# Patient Record
Sex: Female | Born: 1935 | Race: White | Hispanic: No | Marital: Single | Smoking: Never smoker
Health system: Southern US, Community
[De-identification: ages and names within clinical notes are randomized; demographics above are authoritative.]

## PROBLEM LIST (undated history)

## (undated) DIAGNOSIS — R296 Repeated falls: Secondary | ICD-10-CM

## (undated) DIAGNOSIS — R5381 Other malaise: Secondary | ICD-10-CM

## (undated) DIAGNOSIS — M6281 Muscle weakness (generalized): Secondary | ICD-10-CM

## (undated) DIAGNOSIS — M810 Age-related osteoporosis without current pathological fracture: Secondary | ICD-10-CM

## (undated) DIAGNOSIS — E039 Hypothyroidism, unspecified: Secondary | ICD-10-CM

## (undated) DIAGNOSIS — R262 Difficulty in walking, not elsewhere classified: Secondary | ICD-10-CM

## (undated) DIAGNOSIS — N390 Urinary tract infection, site not specified: Secondary | ICD-10-CM

## (undated) DIAGNOSIS — M549 Dorsalgia, unspecified: Secondary | ICD-10-CM

## (undated) DIAGNOSIS — I471 Supraventricular tachycardia, unspecified: Secondary | ICD-10-CM

## (undated) DIAGNOSIS — M1990 Unspecified osteoarthritis, unspecified site: Secondary | ICD-10-CM

## (undated) DIAGNOSIS — G9341 Metabolic encephalopathy: Secondary | ICD-10-CM

## (undated) DIAGNOSIS — R233 Spontaneous ecchymoses: Secondary | ICD-10-CM

## (undated) DIAGNOSIS — J189 Pneumonia, unspecified organism: Secondary | ICD-10-CM

## (undated) DIAGNOSIS — R011 Cardiac murmur, unspecified: Secondary | ICD-10-CM

## (undated) DIAGNOSIS — I509 Heart failure, unspecified: Secondary | ICD-10-CM

## (undated) DIAGNOSIS — R238 Other skin changes: Secondary | ICD-10-CM

## (undated) DIAGNOSIS — I10 Essential (primary) hypertension: Secondary | ICD-10-CM

## (undated) DIAGNOSIS — I739 Peripheral vascular disease, unspecified: Secondary | ICD-10-CM

## (undated) DIAGNOSIS — G8929 Other chronic pain: Secondary | ICD-10-CM

## (undated) DIAGNOSIS — I340 Nonrheumatic mitral (valve) insufficiency: Secondary | ICD-10-CM

## (undated) DIAGNOSIS — R05 Cough: Secondary | ICD-10-CM

## (undated) DIAGNOSIS — I639 Cerebral infarction, unspecified: Secondary | ICD-10-CM

## (undated) DIAGNOSIS — Z8679 Personal history of other diseases of the circulatory system: Secondary | ICD-10-CM

## (undated) DIAGNOSIS — H9190 Unspecified hearing loss, unspecified ear: Secondary | ICD-10-CM

## (undated) DIAGNOSIS — E785 Hyperlipidemia, unspecified: Secondary | ICD-10-CM

## (undated) HISTORY — DX: Pneumonia, unspecified organism: J18.9

## (undated) HISTORY — PX: TONSILLECTOMY AND ADENOIDECTOMY: SUR1326

## (undated) HISTORY — DX: Heart failure, unspecified: I50.9

## (undated) HISTORY — DX: Other malaise: R53.81

## (undated) HISTORY — DX: Hyperlipidemia, unspecified: E78.5

## (undated) HISTORY — PX: APPENDECTOMY: SHX54

## (undated) HISTORY — DX: Supraventricular tachycardia, unspecified: I47.10

## (undated) HISTORY — DX: Supraventricular tachycardia: I47.1

## (undated) HISTORY — DX: Personal history of other diseases of the circulatory system: Z86.79

## (undated) HISTORY — DX: Age-related osteoporosis without current pathological fracture: M81.0

## (undated) HISTORY — DX: Essential (primary) hypertension: I10

## (undated) HISTORY — DX: Nonrheumatic mitral (valve) insufficiency: I34.0

## (undated) HISTORY — DX: Urinary tract infection, site not specified: N39.0

## (undated) HISTORY — DX: Cerebral infarction, unspecified: I63.9

## (undated) HISTORY — DX: Unspecified osteoarthritis, unspecified site: M19.90

---

## 1958-11-24 HISTORY — PX: DILATION AND CURETTAGE OF UTERUS: SHX78

## 1982-03-25 HISTORY — PX: CATARACT EXTRACTION W/ INTRAOCULAR LENS  IMPLANT, BILATERAL: SHX1307

## 1998-01-06 ENCOUNTER — Other Ambulatory Visit: Admission: RE | Admit: 1998-01-06 | Discharge: 1998-01-06 | Payer: Self-pay | Admitting: Family Medicine

## 1998-01-12 ENCOUNTER — Ambulatory Visit: Admission: RE | Admit: 1998-01-12 | Discharge: 1998-01-12 | Payer: Self-pay | Admitting: Family Medicine

## 1999-01-11 ENCOUNTER — Other Ambulatory Visit: Admission: RE | Admit: 1999-01-11 | Discharge: 1999-01-11 | Payer: Self-pay | Admitting: Family Medicine

## 2000-01-30 ENCOUNTER — Encounter: Admission: RE | Admit: 2000-01-30 | Discharge: 2000-01-30 | Payer: Self-pay | Admitting: Family Medicine

## 2000-01-30 ENCOUNTER — Encounter: Payer: Self-pay | Admitting: Family Medicine

## 2000-09-17 ENCOUNTER — Other Ambulatory Visit: Admission: RE | Admit: 2000-09-17 | Discharge: 2000-09-17 | Payer: Self-pay | Admitting: Gastroenterology

## 2000-12-23 HISTORY — PX: OTHER SURGICAL HISTORY: SHX169

## 2001-01-19 ENCOUNTER — Encounter: Payer: Self-pay | Admitting: Surgery

## 2001-01-19 ENCOUNTER — Encounter (INDEPENDENT_AMBULATORY_CARE_PROVIDER_SITE_OTHER): Payer: Self-pay

## 2001-01-19 ENCOUNTER — Inpatient Hospital Stay (HOSPITAL_COMMUNITY): Admission: RE | Admit: 2001-01-19 | Discharge: 2001-01-26 | Payer: Self-pay | Admitting: Surgery

## 2001-06-15 ENCOUNTER — Other Ambulatory Visit: Admission: RE | Admit: 2001-06-15 | Discharge: 2001-06-15 | Payer: Self-pay | Admitting: Family Medicine

## 2001-07-06 ENCOUNTER — Encounter: Payer: Self-pay | Admitting: Family Medicine

## 2001-07-06 ENCOUNTER — Encounter: Admission: RE | Admit: 2001-07-06 | Discharge: 2001-07-06 | Payer: Self-pay | Admitting: Family Medicine

## 2002-11-18 ENCOUNTER — Encounter: Admission: RE | Admit: 2002-11-18 | Discharge: 2002-11-18 | Payer: Self-pay | Admitting: Family Medicine

## 2002-11-18 ENCOUNTER — Encounter: Payer: Self-pay | Admitting: Family Medicine

## 2004-07-31 ENCOUNTER — Other Ambulatory Visit: Admission: RE | Admit: 2004-07-31 | Discharge: 2004-07-31 | Payer: Self-pay | Admitting: Family Medicine

## 2005-01-27 ENCOUNTER — Inpatient Hospital Stay (HOSPITAL_COMMUNITY): Admission: EM | Admit: 2005-01-27 | Discharge: 2005-01-30 | Payer: Self-pay | Admitting: Emergency Medicine

## 2006-04-01 ENCOUNTER — Inpatient Hospital Stay (HOSPITAL_COMMUNITY): Admission: AD | Admit: 2006-04-01 | Discharge: 2006-04-04 | Payer: Self-pay | Admitting: Orthopedic Surgery

## 2006-04-01 ENCOUNTER — Encounter (INDEPENDENT_AMBULATORY_CARE_PROVIDER_SITE_OTHER): Payer: Self-pay | Admitting: Neurology

## 2006-04-02 ENCOUNTER — Ambulatory Visit: Payer: Self-pay | Admitting: Physical Medicine & Rehabilitation

## 2006-07-04 ENCOUNTER — Encounter: Admission: RE | Admit: 2006-07-04 | Discharge: 2006-07-04 | Payer: Self-pay | Admitting: Family Medicine

## 2008-03-25 ENCOUNTER — Encounter: Admission: RE | Admit: 2008-03-25 | Discharge: 2009-03-23 | Payer: Self-pay | Admitting: Family Medicine

## 2008-03-25 DIAGNOSIS — R296 Repeated falls: Secondary | ICD-10-CM

## 2008-03-25 HISTORY — DX: Repeated falls: R29.6

## 2008-08-13 ENCOUNTER — Inpatient Hospital Stay (HOSPITAL_COMMUNITY): Admission: EM | Admit: 2008-08-13 | Discharge: 2008-08-17 | Payer: Self-pay | Admitting: Emergency Medicine

## 2008-08-15 ENCOUNTER — Ambulatory Visit: Payer: Self-pay | Admitting: Vascular Surgery

## 2008-08-15 ENCOUNTER — Encounter (INDEPENDENT_AMBULATORY_CARE_PROVIDER_SITE_OTHER): Payer: Self-pay | Admitting: Internal Medicine

## 2008-11-09 ENCOUNTER — Ambulatory Visit: Payer: Self-pay | Admitting: Diagnostic Radiology

## 2008-11-09 ENCOUNTER — Emergency Department (HOSPITAL_BASED_OUTPATIENT_CLINIC_OR_DEPARTMENT_OTHER): Admission: EM | Admit: 2008-11-09 | Discharge: 2008-11-09 | Payer: Self-pay | Admitting: Emergency Medicine

## 2008-12-03 ENCOUNTER — Ambulatory Visit: Payer: Self-pay | Admitting: Diagnostic Radiology

## 2008-12-03 ENCOUNTER — Emergency Department (HOSPITAL_BASED_OUTPATIENT_CLINIC_OR_DEPARTMENT_OTHER): Admission: EM | Admit: 2008-12-03 | Discharge: 2008-12-03 | Payer: Self-pay | Admitting: Emergency Medicine

## 2009-02-07 ENCOUNTER — Encounter: Admission: RE | Admit: 2009-02-07 | Discharge: 2009-03-22 | Payer: Self-pay | Admitting: Family Medicine

## 2009-02-21 ENCOUNTER — Encounter: Admission: RE | Admit: 2009-02-21 | Discharge: 2009-02-21 | Payer: Self-pay | Admitting: Cardiology

## 2009-03-22 ENCOUNTER — Encounter: Admission: RE | Admit: 2009-03-22 | Discharge: 2009-03-22 | Payer: Self-pay | Admitting: Family Medicine

## 2009-03-25 ENCOUNTER — Encounter: Admission: RE | Admit: 2009-03-25 | Discharge: 2009-04-10 | Payer: Self-pay | Admitting: Family Medicine

## 2009-05-01 ENCOUNTER — Inpatient Hospital Stay (HOSPITAL_COMMUNITY): Admission: EM | Admit: 2009-05-01 | Discharge: 2009-05-08 | Payer: Self-pay | Admitting: Neurology

## 2009-05-02 ENCOUNTER — Encounter (INDEPENDENT_AMBULATORY_CARE_PROVIDER_SITE_OTHER): Payer: Self-pay | Admitting: Neurology

## 2009-07-21 ENCOUNTER — Observation Stay (HOSPITAL_COMMUNITY): Admission: EM | Admit: 2009-07-21 | Discharge: 2009-07-26 | Payer: Self-pay | Admitting: Emergency Medicine

## 2010-01-18 ENCOUNTER — Ambulatory Visit: Payer: Self-pay | Admitting: Diagnostic Radiology

## 2010-01-18 ENCOUNTER — Encounter: Payer: Self-pay | Admitting: Emergency Medicine

## 2010-01-18 ENCOUNTER — Inpatient Hospital Stay (HOSPITAL_COMMUNITY): Admission: EM | Admit: 2010-01-18 | Discharge: 2010-01-24 | Payer: Self-pay | Admitting: Internal Medicine

## 2010-03-01 ENCOUNTER — Ambulatory Visit: Payer: Self-pay | Admitting: Cardiology

## 2010-03-04 ENCOUNTER — Emergency Department (HOSPITAL_COMMUNITY)
Admission: EM | Admit: 2010-03-04 | Discharge: 2010-03-04 | Payer: Self-pay | Source: Home / Self Care | Admitting: Emergency Medicine

## 2010-03-21 ENCOUNTER — Ambulatory Visit (HOSPITAL_COMMUNITY)
Admission: RE | Admit: 2010-03-21 | Discharge: 2010-03-21 | Payer: Self-pay | Source: Home / Self Care | Attending: Internal Medicine | Admitting: Internal Medicine

## 2010-04-14 ENCOUNTER — Encounter: Payer: Self-pay | Admitting: Family Medicine

## 2010-04-15 ENCOUNTER — Encounter: Payer: Self-pay | Admitting: Family Medicine

## 2010-06-06 LAB — CBC
Hemoglobin: 14.8 g/dL (ref 12.0–15.0)
MCH: 30.5 pg (ref 26.0–34.0)
RBC: 4.83 MIL/uL (ref 3.87–5.11)
RDW: 14.3 % (ref 11.5–15.5)
WBC: 7.7 10*3/uL (ref 4.0–10.5)

## 2010-06-06 LAB — BASIC METABOLIC PANEL
BUN: 19 mg/dL (ref 6–23)
BUN: 26 mg/dL — ABNORMAL HIGH (ref 6–23)
CO2: 26 mEq/L (ref 19–32)
Calcium: 9.6 mg/dL (ref 8.4–10.5)
Chloride: 109 mEq/L (ref 96–112)
Creatinine, Ser: 1 mg/dL (ref 0.4–1.2)
Glucose, Bld: 105 mg/dL — ABNORMAL HIGH (ref 70–99)
Sodium: 140 mEq/L (ref 135–145)

## 2010-06-06 LAB — URINALYSIS, ROUTINE W REFLEX MICROSCOPIC
Bilirubin Urine: NEGATIVE
Glucose, UA: NEGATIVE mg/dL
Ketones, ur: NEGATIVE mg/dL
Leukocytes, UA: NEGATIVE
Nitrite: NEGATIVE
Urobilinogen, UA: 0.2 mg/dL (ref 0.0–1.0)

## 2010-06-06 LAB — DIFFERENTIAL
Lymphocytes Relative: 13 % (ref 12–46)
Monocytes Absolute: 0.6 10*3/uL (ref 0.1–1.0)
Monocytes Relative: 7 % (ref 3–12)

## 2010-06-06 LAB — URINE MICROSCOPIC-ADD ON

## 2010-06-12 LAB — DIFFERENTIAL
Basophils Absolute: 0 10*3/uL (ref 0.0–0.1)
Basophils Absolute: 0.1 10*3/uL (ref 0.0–0.1)
Basophils Relative: 1 % (ref 0–1)
Basophils Relative: 1 % (ref 0–1)
Eosinophils Absolute: 0 10*3/uL (ref 0.0–0.7)
Eosinophils Absolute: 0.1 10*3/uL (ref 0.0–0.7)
Eosinophils Relative: 1 % (ref 0–5)
Lymphocytes Relative: 14 % (ref 12–46)
Monocytes Absolute: 0.6 10*3/uL (ref 0.1–1.0)
Monocytes Relative: 11 % (ref 3–12)
Neutro Abs: 4.3 10*3/uL (ref 1.7–7.7)
Neutrophils Relative %: 63 % (ref 43–77)

## 2010-06-12 LAB — CBC
HCT: 40.8 % (ref 36.0–46.0)
HCT: 41.3 % (ref 36.0–46.0)
HCT: 40.1 % (ref 36.0–46.0)
HCT: 40.7 % (ref 36.0–46.0)
Hemoglobin: 13.5 g/dL (ref 12.0–15.0)
Hemoglobin: 13.4 g/dL (ref 12.0–15.0)
MCHC: 33.2 g/dL (ref 30.0–36.0)
MCHC: 32.9 g/dL (ref 30.0–36.0)
MCV: 92.5 fL (ref 78.0–100.0)
MCV: 92.1 fL (ref 78.0–100.0)
Platelets: 219 10*3/uL (ref 150–400)
Platelets: 238 10*3/uL (ref 150–400)
RBC: 4.35 MIL/uL (ref 3.87–5.11)
RDW: 15.9 % — ABNORMAL HIGH (ref 11.5–15.5)
RDW: 16 % — ABNORMAL HIGH (ref 11.5–15.5)
RDW: 15.8 % — ABNORMAL HIGH (ref 11.5–15.5)
WBC: 6.8 10*3/uL (ref 4.0–10.5)

## 2010-06-12 LAB — COMPREHENSIVE METABOLIC PANEL
Albumin: 3.7 g/dL (ref 3.5–5.2)
Alkaline Phosphatase: 49 U/L (ref 39–117)
BUN: 21 mg/dL (ref 6–23)
CO2: 31 mEq/L (ref 19–32)
Chloride: 102 mEq/L (ref 96–112)
Creatinine, Ser: 1.07 mg/dL (ref 0.4–1.2)
GFR calc non Af Amer: 47 mL/min — ABNORMAL LOW (ref >60)
Glucose, Bld: 93 mg/dL (ref 70–99)
Potassium: 4 mEq/L (ref 3.5–5.1)
Total Bilirubin: 0.7 mg/dL (ref 0.3–1.2)

## 2010-06-12 LAB — BASIC METABOLIC PANEL
BUN: 31 mg/dL — ABNORMAL HIGH (ref 6–23)
BUN: 23 mg/dL (ref 6–23)
CO2: 31 mEq/L (ref 19–32)
CO2: 27 mEq/L (ref 19–32)
Calcium: 9 mg/dL (ref 8.4–10.5)
Creatinine, Ser: 0.77 mg/dL (ref 0.4–1.2)
GFR calc non Af Amer: >60 mL/min (ref >60)
GFR calc non Af Amer: 56 mL/min — ABNORMAL LOW (ref >60)
Glucose, Bld: 91 mg/dL (ref 70–99)
Glucose, Bld: 94 mg/dL (ref 70–99)
Glucose, Bld: 70 mg/dL (ref 70–99)
Potassium: 4.1 mEq/L (ref 3.5–5.1)
Potassium: 4.2 mEq/L (ref 3.5–5.1)
Potassium: 4.3 mEq/L (ref 3.5–5.1)
Sodium: 141 mEq/L (ref 135–145)

## 2010-06-12 LAB — LIPID PANEL
LDL Cholesterol: 152 mg/dL — ABNORMAL HIGH (ref 0–99)
Total CHOL/HDL Ratio: 5.2 RATIO
Triglycerides: 174 mg/dL — ABNORMAL HIGH (ref <150)
VLDL: 35 mg/dL (ref 0–40)

## 2010-06-12 LAB — PHOSPHORUS: Phosphorus: 4.1 mg/dL (ref 2.3–4.6)

## 2010-06-12 LAB — URINE CULTURE

## 2010-06-12 LAB — POCT CARDIAC MARKERS: Troponin i, poc: <0.05 ng/mL (ref 0.00–0.09)

## 2010-06-12 LAB — PROTIME-INR: Prothrombin Time: 13.8 seconds (ref 11.6–15.2)

## 2010-06-12 LAB — URINALYSIS, ROUTINE W REFLEX MICROSCOPIC
Bilirubin Urine: NEGATIVE
Nitrite: NEGATIVE
Specific Gravity, Urine: 1.01 (ref 1.005–1.030)
Urobilinogen, UA: 0.2 mg/dL (ref 0.0–1.0)

## 2010-06-12 LAB — TSH: TSH: 7.595 u[IU]/mL — ABNORMAL HIGH (ref 0.350–4.500)

## 2010-06-12 LAB — T4, FREE: Free T4: 0.96 ng/dL (ref 0.80–1.80)

## 2010-06-13 LAB — CK TOTAL AND CKMB (NOT AT ARMC)
CK, MB: 7.4 ng/mL (ref 0.3–4.0)
Relative Index: INVALID (ref 0.0–2.5)
Total CK: 64 U/L (ref 7–177)

## 2010-06-13 LAB — URINALYSIS, ROUTINE W REFLEX MICROSCOPIC
Hgb urine dipstick: NEGATIVE
Nitrite: NEGATIVE
Protein, ur: NEGATIVE mg/dL
Urobilinogen, UA: 0.2 mg/dL (ref 0.0–1.0)

## 2010-06-13 LAB — HEPARIN LEVEL (UNFRACTIONATED): Heparin Unfractionated: 0.32 IU/mL (ref 0.30–0.70)

## 2010-06-13 LAB — CARDIAC PANEL(CRET KIN+CKTOT+MB+TROPI)
Relative Index: INVALID (ref 0.0–2.5)
Total CK: 49 U/L (ref 7–177)

## 2010-06-13 LAB — COMPREHENSIVE METABOLIC PANEL
Albumin: 3.6 g/dL (ref 3.5–5.2)
BUN: 20 mg/dL (ref 6–23)
Calcium: 9 mg/dL (ref 8.4–10.5)
Creatinine, Ser: 0.77 mg/dL (ref 0.4–1.2)
Glucose, Bld: 101 mg/dL — ABNORMAL HIGH (ref 70–99)
Total Protein: 6.8 g/dL (ref 6.0–8.3)

## 2010-06-13 LAB — APTT: aPTT: 26 seconds (ref 24–37)

## 2010-06-13 LAB — CBC
HCT: 37.7 % (ref 36.0–46.0)
HCT: 39.7 % (ref 36.0–46.0)
Hemoglobin: 12.5 g/dL (ref 12.0–15.0)
Hemoglobin: 13.1 g/dL (ref 12.0–15.0)
MCHC: 33.1 g/dL (ref 30.0–36.0)
MCHC: 33.2 g/dL (ref 30.0–36.0)
MCHC: 33.4 g/dL (ref 30.0–36.0)
MCV: 91.2 fL (ref 78.0–100.0)
MCV: 91.4 fL (ref 78.0–100.0)
Platelets: 215 10*3/uL (ref 150–400)
RBC: 4.41 MIL/uL (ref 3.87–5.11)
RBC: 4.42 MIL/uL (ref 3.87–5.11)
RDW: 15.1 % (ref 11.5–15.5)
RDW: 15.4 % (ref 11.5–15.5)
WBC: 7.7 10*3/uL (ref 4.0–10.5)
WBC: 9.5 10*3/uL (ref 4.0–10.5)

## 2010-06-13 LAB — DIFFERENTIAL
Basophils Relative: 1 % (ref 0–1)
Lymphocytes Relative: 15 % (ref 12–46)
Lymphs Abs: 1.2 10*3/uL (ref 0.7–4.0)
Monocytes Relative: 7 % (ref 3–12)
Neutro Abs: 6.2 10*3/uL (ref 1.7–7.7)
Neutrophils Relative %: 76 % (ref 43–77)

## 2010-06-13 LAB — TROPONIN I: Troponin I: 0.11 ng/mL — ABNORMAL HIGH (ref 0.00–0.06)

## 2010-06-13 LAB — BASIC METABOLIC PANEL
BUN: 19 mg/dL (ref 6–23)
Calcium: 8.4 mg/dL (ref 8.4–10.5)
Calcium: 8.6 mg/dL (ref 8.4–10.5)
Creatinine, Ser: 0.7 mg/dL (ref 0.4–1.2)
GFR calc Af Amer: >60 mL/min (ref >60)
GFR calc non Af Amer: >60 mL/min (ref >60)
Glucose, Bld: 91 mg/dL (ref 70–99)

## 2010-06-13 LAB — PROTIME-INR: INR: 1.1 (ref 0.00–1.49)

## 2010-06-13 LAB — LIPID PANEL: Triglycerides: 141 mg/dL (ref <150)

## 2010-06-13 LAB — VITAMIN B12: Vitamin B-12: 589 pg/mL (ref 211–911)

## 2010-06-13 LAB — TSH: TSH: 5.159 u[IU]/mL — ABNORMAL HIGH (ref 0.350–4.500)

## 2010-07-03 LAB — COMPREHENSIVE METABOLIC PANEL
AST: 26 U/L (ref 0–37)
BUN: 16 mg/dL (ref 6–23)
BUN: 19 mg/dL (ref 6–23)
CO2: 29 mEq/L (ref 19–32)
CO2: 32 mEq/L (ref 19–32)
Calcium: 8.7 mg/dL (ref 8.4–10.5)
Calcium: 9.4 mg/dL (ref 8.4–10.5)
Chloride: 101 mEq/L (ref 96–112)
Creatinine, Ser: 0.61 mg/dL (ref 0.4–1.2)
Creatinine, Ser: 0.74 mg/dL (ref 0.4–1.2)
GFR calc non Af Amer: >60 mL/min (ref >60)
GFR calc non Af Amer: >60 mL/min (ref >60)
Glucose, Bld: 112 mg/dL — ABNORMAL HIGH (ref 70–99)
Potassium: 3.9 mEq/L (ref 3.5–5.1)
Sodium: 140 mEq/L (ref 135–145)
Total Bilirubin: 0.8 mg/dL (ref 0.3–1.2)

## 2010-07-03 LAB — CBC
HCT: 36.7 % (ref 36.0–46.0)
Hemoglobin: 12 g/dL (ref 12.0–15.0)
MCHC: 32.3 g/dL (ref 30.0–36.0)
MCHC: 32.6 g/dL (ref 30.0–36.0)
MCV: 89.7 fL (ref 78.0–100.0)
Platelets: 178 10*3/uL (ref 150–400)
Platelets: 202 10*3/uL (ref 150–400)
RDW: 15.6 % — ABNORMAL HIGH (ref 11.5–15.5)
RDW: 16 % — ABNORMAL HIGH (ref 11.5–15.5)
WBC: 8.1 10*3/uL (ref 4.0–10.5)

## 2010-07-03 LAB — DIFFERENTIAL
Basophils Absolute: 0 10*3/uL (ref 0.0–0.1)
Lymphocytes Relative: 9 % — ABNORMAL LOW (ref 12–46)
Lymphs Abs: 0.8 10*3/uL (ref 0.7–4.0)
Neutrophils Relative %: 82 % — ABNORMAL HIGH (ref 43–77)

## 2010-07-03 LAB — CK TOTAL AND CKMB (NOT AT ARMC)
CK, MB: 3.5 ng/mL (ref 0.3–4.0)
CK, MB: 4.1 ng/mL — ABNORMAL HIGH (ref 0.3–4.0)
Relative Index: 1.9 (ref 0.0–2.5)
Relative Index: 2.1 (ref 0.0–2.5)
Total CK: 167 U/L (ref 7–177)
Total CK: 259 U/L — ABNORMAL HIGH (ref 7–177)

## 2010-07-03 LAB — BRAIN NATRIURETIC PEPTIDE: Pro B Natriuretic peptide (BNP): 763 pg/mL — ABNORMAL HIGH (ref 0.0–100.0)

## 2010-07-03 LAB — TROPONIN I: Troponin I: 0.18 ng/mL — ABNORMAL HIGH (ref 0.00–0.06)

## 2010-07-03 LAB — PROTIME-INR
INR: 1.1 (ref 0.00–1.49)
Prothrombin Time: 14.4 seconds (ref 11.6–15.2)

## 2010-07-27 ENCOUNTER — Telehealth: Payer: Self-pay | Admitting: Cardiology

## 2010-07-27 NOTE — Telephone Encounter (Signed)
Tried calling daughter back. No answer.

## 2010-07-27 NOTE — Telephone Encounter (Signed)
Patient daughter, has indicated that the patient Lasix has been increased to 80mg .  She is questioning, is this ok.  Please call.

## 2010-07-30 NOTE — Telephone Encounter (Signed)
Daughter called stating PA at [facility] has put her mother on Lasix 80 mg daily. States legs not swollen and is not SOB. States they have done blood work but doesn't know results. Advised that Dr. Chilton Si will be the one to write orders while she is at [facility]. We can't over ride any meds if we are not following lab work. Now when she is seen here and we do lab work Dr. Swaziland will decide on meds. Has an app in June. She is just concerned because mother has never been on that much Lasix. Dr. Swaziland agrees that unless we are following lab work we can't decide medication dosage.

## 2010-08-07 NOTE — H&P (Signed)
NAMERAMESHA, POSTER             ACCOUNT NO.:  [id number]   MEDICAL RECORD NO.:  [id number]          PATIENT TYPE:  INP   LOCATION:  1404                         FACILITY:  [facility]   PHYSICIAN:  Joylene John, MD       DATE OF BIRTH:  December 24, 1909   DATE OF ADMISSION:  08/12/2008  DATE OF DISCHARGE:                              HISTORY & PHYSICAL   CHIEF COMPLAINT:  Status post fall and head laceration.   HISTORY OF PRESENT ILLNESS:  This is a 77 year old female coming in  status post fall.  The patient is hard of hearing, so most of the  history is obtained from the daughter.  The patient's daughter tells me  that the patient has been falling for years.  However, over the last 6  months, the frequency has increased.  Prior to this fall today, the  patient did fall on Sunday as well which resulted in skin break down of  the leg.  According to the daughter, the patient does use a walker.  However, the patient was in the kitchen, was making a sandwich, turned  around to reach to the table, did not have the walker and fell down.  The fall was not witnessed by the daughter.  However, she did hear the  patient fall.  According to the patient, she never had any chest pain,  nausea, vomiting, fevers, chills, dizziness or lightheadedness.  She  denied any visual changes.  Since the fall was not witnessed, the  patient's daughter does not know how she fell or what she heard.  However, the daughter did notice that the patient was on her back, did  not lose any consciousness and was bleeding from her head.  The  patient's daughter called EMS and brought her to the ER for further  workup.   PAST MEDICAL HISTORY:  1. Hypertension.  2. Osteoarthritis.  3. SVT.  4. Osteoporosis.  5. Stroke.   SOCIAL HISTORY:  The patient does live with the daughter.  No alcohol,  drugs or tobacco.   FAMILY HISTORY:  No diabetes or hypertension.   ALLERGIES:  No known allergies.   LABORATORY DATA:  Troponin  first set is elevated 0.18, CK-MB is 4.3 -  slightly elevated as well.  EKG:  Right bundle branch block, unchanged  from past; no new ST-T changes.  Sodium 136, potassium 4.1, chloride  101, bicarb 29, BUN 19, creatinine 0.74, glucose 112, calcium 9.4.  Hemoglobin 13.7, hematocrit 42.3, white count 8.1, platelets 202.  Chest  x-ray showed that she has right rib fractures 7-9.  CT of the head  showed atrophy, small vessel disease, no acute pathology.  She has  arthritis of the cervical spine.   ROS: please refer to the hpi, otherwise a 14 point ros was negative   PHYSICAL EXAMINATION:  This is a frail elderly woman who is awake and  alert, hard of hearing.  She does not have any icterus or pallor.  Distended neck veins.  No bruits heard on neck exam.  CARDIOVASCULAR:  Regular rhythm.  There is murmur heard left sternal  border.  LUNG EXAM:  Is clear.  However, there is poor effort secondary to the  pain caused from the fracture, so unable to do good posterior  auscultation.  RIGHT LOWER EXTREMITY:  The shin is warm and tender to touch consistent  with cellulitis around the area of injury.  She also has bilateral  chronic skin changes.   PLAN:  Is to admit the patient to a telemetry bed.  Do serial cardiac  enzymes.  If there is an increasing trend, then to consider cardiology  consult.  However, given the patient's advanced age, she will likely be  medically managed.  We will continue her aspirin and beta blocker.  We  will get an echocardiogram to evaluate her LV function and her murmur  since I do not see an echocardiogram in the computer.  We will also get  carotid Dopplers as she did have some carotid disease in the past.  We  will give her some Keflex for her cellulitis.  The patient is a DNR/DNI  after discussing with the daughter.  I will place this order.  I have  also explained to the daughter that increasing frequency of falls is  likely a marker of frailty and overall  marker of the patient's poor  health.  The patient may need a skilled facility after this  hospitalization.  I have called a PT/OT consult to have her evaluated  during this hospitalization.      Joylene John, MD  Electronically Signed     RP/MEDQ  D:  08/13/2008  T:  08/13/2008  Job:  [id number]

## 2010-08-07 NOTE — Discharge Summary (Signed)
NAMEKIMORAH, RIDOLFI             ACCOUNT NO.:  [id number]   MEDICAL RECORD NO.:  [id number]          PATIENT TYPE:  INP   LOCATION:  1404                         FACILITY:  [facility]   PHYSICIAN:  Corinna L. Lendell Caprice, MDDATE OF BIRTH:  05-15-1909   DATE OF ADMISSION:  08/12/2008  DATE OF DISCHARGE:  08/17/2008                               DISCHARGE SUMMARY   DISCHARGE DIAGNOSES:  1. Fall.  2. Resulting scalp laceration.  3. Frequent falls secondary to gait instability.  4. Mild right lower extremity cellulitis.  5. Abnormal troponin without evidence of acute coronary syndrome.  6. Hypertension.  7. Osteoporosis.  8. History of stroke.  9. Dyslipidemia.  10.History of non-Q-wave myocardial infarction.  11.Moderate mitral valve regurgitation, moderate pulmonic valve      regurgitation.  12.Elevated peak pulmonary artery pressure 44 mmHg.  13.History of peripheral edema.  14.Do not resuscitate.   DISCHARGE MEDICATIONS:  1. Doxycycline 100 mg p.o. b.i.d. for four more doses then stop.  2. Colace 100 mg p.o. b.i.d.  3. Fosamax 70 mg weekly.  4. Micardis 40 mg a day.  5. Multivitamin a day.  6. Zocor 20 mg a day.  7. Toprol XL 25 mg a day.  8. Her Lasix has been held during this hospitalization but may be      resumed at 40 mg daily as previous or as per nursing home      physician.  9. Caltrate with vitamin D 600 mg p.o. b.i.d.  10.Aspirin 81 mg a day.  11.Tylenol 650 mg p.o. q.4 h p.r.n. pain.   CONDITION:  Stable.   ACTIVITY:  Continue physical therapy, occupational therapy with walker  and fall precautions.   CONSULTATIONS:  None.   PROCEDURES:  She had her scalp laceration stapled in the emergency room.   DIET:  Should be cardiac.   FOLLOWUP:  She will need her staples removed in 2-5 days.  She will also  need monitoring of blood pressure and volume status.   LABS:  CBC unremarkable.  PT/PTT normal.  Complete metabolic panel  unremarkable.  BNP 763, peak  troponin was 0.2.  Peak CPK was 259.  Peak  MB fraction was 4.3.   SPECIAL STUDIES AND RADIOLOGY:  Chest x-ray on admission showed right  seventh, eighth, ninth rib fractures (old).  A CT of the C-spine showed  anterior subluxations of C3 on C4, C4 on C5 and C5 on C6 maybe explained  by facet arthropathy but cannot exclude traumatic etiology.  CT of the  head showed atrophy and chronic small vessel ischemic changes.  Two  views of the chest showed stable cardiomegaly.  EKG showed normal sinus  rhythm with right bundle branch block.  No significant change from  previous.  Echocardiogram showed severe concentric and moderate  asymmetric hypertrophy, normal systolic function, mild aortic valve  regurgitation, moderate mitral valve regurgitation, mildly to moderately  increased right ventricular systolic pressure, moderate pulmonic valve  regurgitation, peak pulmonary artery pressure 44 mmHg.  Carotid Dopplers  showed no significant stenosis.   HISTORY AND HOSPITAL COURSE:  [name] is a delightful 77 year old  white female who uses a walker at home and apparently was not doing so  and fell.  She lives with her daughter.  Apparently she has had several  falls recently which her daughter attributes to her not consistently  using her walker.  She had no chest pain, no shortness of breath, no  seizure activity.  The patient was in the kitchen making a sandwich when  she turned around to reach the table, fell and hit her head.  She  suffered a scalp laceration that was stapled in the emergency room.  She  was admitted for monitoring.  Her troponin was ordered by ED physician  and noted to be slightly elevated.  Also her CPK was slightly elevated.  The patient has a cardiac history but had no chest pain or other cardiac  symptoms.  I suspect this is related to the fall.  Nevertheless, an  echocardiogram was done which showed no wall motion abnormalities and so  I do not feel that this needs  further workup at this time, particularly  due to the patient's age.  She also was noted to have a mildly elevated  BNP.  Apparently this is chronic.  Clinically, she was not in failure  and if anything appeared slightly volume depleted and so her Lasix was  held.  The patient had physical therapy, occupational therapy.  She had  an unsteady gait and was felt to benefit from short-term skilled nursing  facility.  This patient is being transferred to [facility] today.   The patient was noted to have rib fractures on x-ray of the chest but  she had no tenderness, pain and apparently has a history of previous rib  fractures according to the daughter.  Therefore these are felt to be  old.  The patient otherwise remained very stable during the  hospitalization.  Total time on the day of discharge 40 minutes.      Corinna L. Lendell Caprice, MD  Electronically Signed     CLS/MEDQ  D:  08/17/2008  T:  08/17/2008  Job:  [id number]   cc:   Chales Salmon. Abigail Miyamoto, M.D.  Fax: [phone]   Pramod P. Pearlean Brownie, MD  Fax: [phone]

## 2010-08-10 NOTE — Op Note (Signed)
[facility]  Patient:    Julie Frank, Julie Frank Visit Number: [id number] MRN: [id number]          Service Type: SUR Location: 4W 0471 01 Attending Physician:  Katha Cabal Dictated by:   Thornton Park Daphine Deutscher, M.D. Proc. Date: 01/20/01 Admit Date:  01/19/2001   CC:         Chales Salmon. Abigail Miyamoto, M.D.             Genene Churn. Sherin Quarry, M.D.                           Operative Report  CCS# [id number]  PREOPERATIVE DIAGNOSIS:  Villous adenoma of the cecum.  POSTOPERATIVE DIAGNOSIS:  Villous adenoma of the cecum.  OPERATION PERFORMED:  Laparoscopically assisted right hemicolectomy.  SURGEON:  Thornton Park. Daphine Deutscher, M.D.  ASSISTANT:  Sharlet Salina T. Hoxworth, M.D.  ANESTHESIA:  General endotracheal.  DESCRIPTION OF PROCEDURE:  Ms. Jenison was taken to room 1 on January 20, 2001 and given general anesthesia.  She had previously had a bowel prep with GoLYTELY as well as neomycin and erythromycin and received Cefotan this morning.  After prepping with Betadine she was draped sterilely.  I made a transverse incision coming out of the umbilicus and through that using a Hasson technique entered the abdomen without difficulty and inserted the Hasson cannula.  Two 5 mm trocars were placed on the patients left side and with those three ports, we inserted the 30 degree scope and then mobilized the right colon using Harmonic scalpel.  This was carried out beginning up near the gallbladder and the stomach and carrying this up to the hepatic flexure and then carried it down to the cecum.  I felt like I had this well mobilized. There was no active bleeding and everything looked good.  I then extended the transverse incision slightly.  I used the wound retractor used for the hand assist device.  This was inserted into the wound that I opened up partially. It did not completely transect the rectus muscle and then placed this wound protector.  I was able to then pull out the  terminal ileum, cecum, ascending and hepatic flexure easily in the transverse colon.  I selected sites in the terminal ileum to divide and divided it with a GIA.  I divided the colon again in a similar fashion on the transverse colon.  The mesentery was then divided with Kelly clamps and was oversewn with suture ligatures of 3 and 2-0 silk. The main vascular pedicle was oversewn twice using a double tie.  The bowel was then oriented and lay in a side-to-side fashion along the antimesenteric border.  I came down away from the staple transverse closure of the transverse colon and then oriented these.  I tacked these together, opened them, inserted the GIA, fired the GIA, fired the GIA.  A nice anastomosis was present.  The common defect was closed with the TA-55.  Prior to doing this, I had closed the mesentery.  We changed our gloves and removed the wound protector.  The bowel was returned to its anatomic position.  I washed out the area and there was essentially no bleeding.  There was essentially no spillage during the case.  No other abnormalities were noted.  The wound was then closed using #1 PDS in the posterior sheath.  I then injected the anterior sheath of the muscle with some 0.5% Marcaine.  The anterior sheath was closed  with running 0 PDS also.  The wound was irrigated and closed with staples as were the two port sites laterally.  The patient seemed to tolerate the procedure well and was taken to the recovery room in satisfactory condition. Prior to closure, Dr. Guilford Shi called and acknowledged the fact that we did have the villous adenoma in the right colon. Dictated by:   Thornton Park Daphine Deutscher, M.D. Attending Physician:  Katha Cabal DD:  01/20/01 TD:  01/20/01 Job: [id number]

## 2010-08-10 NOTE — Consult Note (Signed)
NAMELOREY, PALLETT             ACCOUNT NO.:  [id number]   MEDICAL RECORD NO.:  [id number]          PATIENT TYPE:  INP   LOCATION:  1412                         [facility]ACILITY[facility]  [facility]   PHYSICIAN:  Georga Hacking, M.D.DATE OF BIRTH:  [birth year]   DATE OF CONSULTATION:  01/27/2005  DATE OF DISCHARGE:                                   CONSULTATION   REASON FOR CONSULTATION:  Thank you for asking me to see this 77 year old  female for evaluation of arrhythmia and abnormal cardiac enzymes. The  patient has previously been in good health without significant medical  illnesses or problems. She, on a routine physical, had a murmur noted and  had an echocardiogram done by Dr. Swaziland recently but the report given last  week that she had minor thickening of the aortic and mitral valve but  nothing further needed to be done about this. She has really not been that  symptomatic. Her husband had died earlier this year and she is currently  living with her daughter. She was in her usual state of health and was  sitting at the dinner table last night when an episode of transient  confusion and fumbling with her fork was noted. She became somewhat  unresponsive and was pale according to the daughter but had no recollection  of the event later. EMS was called and came to the house and offered to  transport her to the emergency room and found her pulse to be rapid. The  daughter felt that she would be better off if she were taken to the  emergency room later for evaluation and a friend took her and the daughter  later followed. She was found to be in SVT on EKG when she arrived there but  the arrhythmia resolved and she has just been in sinus rhythm with PAC's  since then. She had cardiac enzymes drawn. The initial set showed a mild  elevation of troponin and subsequent, showed a CPK MB of 8.5 and a troponin  of 0.45. Her glucose was 119. The patient did not have any recollection of  the event  yesterday and has some difficulty giving her health history today.  She denies any chest pain, pressure, tightness, heaviness, shortness of  breath, edema, syncope or angina. She did have 2 episodes where she had to  sit down suddenly last week, when she was in the garage and had a slight  abrasion of her arm.   PAST MEDICAL HISTORY:  Negative for hypertension. She has a history of  fairly significant osteoporosis. She has a possible history of  hyperlipidemia but is on no treatment for this. She has no prior history of  diabetes.   PAST SURGICAL HISTORY:  She had a laparoscopic hemicolectomy previously. She  has had an appendectomy, cataract extraction, hysterectomy, and  appendectomy.   ALLERGIES:  None.   CURRENT MEDICATIONS:  1.  Fosamax 70 mg weekly.  2.  Aspirin daily.   SOCIAL HISTORY:  She is a widow. Her husband died in 2022-04-19 of this year.  She does not smoke or use alcohol to excess and  she currently lives with her  daughter.   FAMILY HISTORY:  Noncontributory.   REVIEW OF SYSTEMS:  Weight has been stable. She has previous cataracts but  no other eye problems. She has significant osteoporosis and arthritis noted.  There is no history of ulcers or GI bleeding that she is aware of. She  complains of urinary frequency and some nocturia. She normally has no  shortness of breath, cough, wheezing, or asthma. There is no previous  history of stroke. Other than as noted above, the remainder of the review of  systems is unremarkable.   PHYSICAL EXAMINATION:  GENERAL:  She is a pleasant, thin female who is in no  acute distress.  VITAL SIGNS:  Blood pressure was 148/63, pulse 77 with regular beats noted.  SKIN:  Warm and dry.  HEENT:  EOMI. PERRLA. CNS clear. Fundi not examined. Pharynx is negative.  NECK:  Supple without masses or JVD. There is a transmitted murmur to the  neck.  LUNGS:  Clear.  CARDIOVASCULAR:  Examination shows a systolic murmur at the aortic area  and  down the left sternal border. No diastolic murmur is noted. No S3 is noted.  Rhythm is slightly irregular.  ABDOMEN:  Soft, non-tender.  EXTREMITIES:  Peripheral pulses are mildly diminished.   LABORATORY DATA:  Her 12 lead EKG on admission shows a right bundle branch  block as well as supraventricular tachycardia with a rate of 167. There is  ST depression laterally noted.   Her CBC is normal. Chemistry panel showed a potassium of 3.9, glucose of  119. CPK was 143 with an MB of 8.5 and troponin of 0.45.   IMPRESSION:  1.  Supraventricular tachycardia, which has currently resolved.  2.  Transient confusion, which may have been due to diminished cerebral      confusion from tachycardia.  3.  Abnormal cardiac enzymes and manifestation of ischemia, secondary to      tachycardia.  4.  Transient memory loss.  5.  Elevation of blood pressure.  6.  Osteoporosis.  7.  History of aortic and mitral valve disease by echocardiogram recently.   RECOMMENDATIONS:  Add beta blocker to regimen and continue to cycle enzymes.  I suspect this is secondary rather than a primary phenomenon. Add Lovenox  for deep venous prophylaxis. Continue telemetry monitoring. I will have Dr.  Swaziland follow.      Georga Hacking, M.D.  Electronically Signed     WST/MEDQ  D:  01/27/2005  T:  01/27/2005  Job:  [id number]   cc:   Chales Salmon. Abigail Miyamoto, M.D.  Fax: [phone]   Peter M. Swaziland, M.D.  Fax: [phone]   Room 1412  [facility]  (have on chart)

## 2010-09-04 ENCOUNTER — Encounter: Payer: Self-pay | Admitting: Cardiology

## 2010-09-06 ENCOUNTER — Encounter: Payer: Self-pay | Admitting: Cardiology

## 2010-09-06 ENCOUNTER — Ambulatory Visit (INDEPENDENT_AMBULATORY_CARE_PROVIDER_SITE_OTHER): Payer: Medicare Other | Admitting: Cardiology

## 2010-09-06 DIAGNOSIS — Z8679 Personal history of other diseases of the circulatory system: Secondary | ICD-10-CM

## 2010-09-06 DIAGNOSIS — I6350 Cerebral infarction due to unspecified occlusion or stenosis of unspecified cerebral artery: Secondary | ICD-10-CM

## 2010-09-06 DIAGNOSIS — I10 Essential (primary) hypertension: Secondary | ICD-10-CM

## 2010-09-06 DIAGNOSIS — I639 Cerebral infarction, unspecified: Secondary | ICD-10-CM | POA: Insufficient documentation

## 2010-09-06 DIAGNOSIS — I471 Supraventricular tachycardia, unspecified: Secondary | ICD-10-CM | POA: Insufficient documentation

## 2010-09-06 DIAGNOSIS — I340 Nonrheumatic mitral (valve) insufficiency: Secondary | ICD-10-CM

## 2010-09-06 DIAGNOSIS — I509 Heart failure, unspecified: Secondary | ICD-10-CM

## 2010-09-06 DIAGNOSIS — I498 Other specified cardiac arrhythmias: Secondary | ICD-10-CM

## 2010-09-06 DIAGNOSIS — I059 Rheumatic mitral valve disease, unspecified: Secondary | ICD-10-CM

## 2010-09-06 DIAGNOSIS — I5032 Chronic diastolic (congestive) heart failure: Secondary | ICD-10-CM | POA: Insufficient documentation

## 2010-09-06 NOTE — Assessment & Plan Note (Signed)
She has chronic congestive heart failure related to diastolic dysfunction. Her volume status appears to be quite good today. I would continue her on 40 mg of Lasix daily with an extra Lasix as needed for weight gain of greater than 3 pounds. I've recommended continue sodium restriction. I have suggested support hose and keeping her feet elevated as much as possible. I will followup again in 6 months.

## 2010-09-06 NOTE — Assessment & Plan Note (Signed)
Well-controlled on low-dose beta blocker therapy.

## 2010-09-06 NOTE — Progress Notes (Signed)
Van Clines Date of Birth: 19-Jan-1910   History of Present Illness: Mrs. Ransom is seen today for followup. She is now residing at [facility]. She is almost 77 years old now. Her daughter reports that she did have some increase swelling in her right ankle. Her Lasix dose was increased to 80 mg per day and then later reduced back to 40 mg per day. Her edema has largely resolved. She has had no increase in dyspnea or orthopnea. Her appetite is good. She feels quite well.  Current Outpatient Prescriptions on File Prior to Visit  Medication Sig Dispense Refill  . acetaminophen (TYLENOL ARTHRITIS PAIN) 650 MG CR tablet Take 650 mg by mouth 2 (two) times daily.        Marland Kitchen alendronate (FOSAMAX) 70 MG tablet Take 70 mg by mouth every 7 (seven) days. Take with a full glass of water on an empty stomach.       Marland Kitchen aspirin 81 MG tablet Take 81 mg by mouth daily.        . Calcium Carbonate (CALTRATE 600 PO) Take by mouth 2 (two) times daily.        . calcium-vitamin D (OSCAL WITH D) 500-200 MG-UNIT per tablet Take 1 tablet by mouth daily.        . furosemide (LASIX) 20 MG tablet Take 40 mg by mouth daily.       . hydrocortisone (ANUSOL-HC) 25 MG suppository Place 25 mg rectally 2 (two) times daily as needed.        Marland Kitchen levothyroxine (SYNTHROID, LEVOTHROID) 25 MCG tablet Take 25 mcg by mouth daily.        . metoprolol succinate (TOPROL-XL) 25 MG 24 hr tablet Take 25 mg by mouth daily.        . Multiple Vitamin (MULTIVITAMIN) tablet Take 1 tablet by mouth daily.        . polyethylene glycol (MIRALAX / GLYCOLAX) packet Take 17 g by mouth every other day.          No Known Allergies  Past Medical History  Diagnosis Date  . CVA (cerebrovascular accident)   . SVT (supraventricular tachycardia)     nonsustained  . Mitral insufficiency     CHRONIC  . Hypertension   . Debility     GENERALIZED  . Hyperlipidemia   . OA (osteoarthritis)   . OP (osteoporosis)   . CHF (congestive  heart failure)   . History of diastolic dysfunction     Past Surgical History  Procedure Date  . Removal colon polpys   . Tonsillectomy and adenoidectomy     History  Smoking status  . Never Smoker   Smokeless tobacco  . Not on file    History  Alcohol Use No    Family History  Problem Relation Age of Onset  . Heart disease Mother     Review of Systems: The review of systems is positive for loss of hearing.  She gets around predominantly in a wheelchair. She does have significant degenerative joint disease.All other systems were reviewed and are negative.  Physical Exam: BP 120/74  Pulse 64  Ht 5\' 3"  (1.6 m)  Wt 115 lb (52.164 kg)  BMI 20.37 kg/m2 She is an elderly white female in no acute distress. She is hard of hearing. Her HEENT exam is unremarkable. She has no JVD or bruits. Lungs are fairly clear. Cardiac exam reveals a harsh grade 2/6 systolic murmur at apex. Abdomen is soft and  nontender. She has trace pretibial edema. LABORATORY DATA: Basic metabolic panel on May 29 was unremarkable. Her BNP level is down to 235.  Assessment / Plan:

## 2010-09-06 NOTE — Patient Instructions (Signed)
Continue current medications.  Sodium restriction.  Support hose and keep feet elevated when possible.  I will follow up in 6 months.

## 2011-03-14 ENCOUNTER — Encounter: Payer: Self-pay | Admitting: Cardiology

## 2011-03-14 ENCOUNTER — Ambulatory Visit: Payer: Medicare Other | Admitting: Cardiology

## 2011-03-14 ENCOUNTER — Ambulatory Visit (INDEPENDENT_AMBULATORY_CARE_PROVIDER_SITE_OTHER): Payer: Medicare Other | Admitting: Cardiology

## 2011-03-14 VITALS — BP 110/62 | HR 82 | Ht 60.0 in | Wt 115.0 lb

## 2011-03-14 DIAGNOSIS — I471 Supraventricular tachycardia: Secondary | ICD-10-CM

## 2011-03-14 DIAGNOSIS — I498 Other specified cardiac arrhythmias: Secondary | ICD-10-CM

## 2011-03-14 DIAGNOSIS — I509 Heart failure, unspecified: Secondary | ICD-10-CM

## 2011-03-14 NOTE — Patient Instructions (Signed)
Continue your current therapy  I will see you again in 6 months.   

## 2011-03-15 NOTE — Assessment & Plan Note (Signed)
She is well compensated on her current medication. We will continue with her current therapy and followup again in 6 months.

## 2011-03-15 NOTE — Progress Notes (Signed)
Van Clines Date of Birth: [birth year]   History of Present Illness: Julie Frank is seen today for followup. She is now residing at [facility] rehabilitation center. She is  77 years old now.  She has had no increase in dyspnea or orthopnea. Her appetite is good. She feels quite well. She denies any palpitations or increase in edema.  Current Outpatient Prescriptions on File Prior to Visit  Medication Sig Dispense Refill  . acetaminophen (TYLENOL ARTHRITIS PAIN) 650 MG CR tablet Take 650 mg by mouth 2 (two) times daily.        Marland Kitchen alendronate (FOSAMAX) 70 MG tablet Take 70 mg by mouth every 7 (seven) days. Take with a full glass of water on an empty stomach.       Marland Kitchen aspirin 81 MG tablet Take 81 mg by mouth daily.        . Calcium Carbonate (CALTRATE 600 PO) Take by mouth 2 (two) times daily.        . calcium-vitamin D (OSCAL WITH D) 500-200 MG-UNIT per tablet Take 1 tablet by mouth daily.        . furosemide (LASIX) 20 MG tablet Take 40 mg by mouth daily.       . hydrocortisone (ANUSOL-HC) 25 MG suppository Place 25 mg rectally 2 (two) times daily as needed.        Marland Kitchen levothyroxine (SYNTHROID, LEVOTHROID) 25 MCG tablet Take 25 mcg by mouth daily.        . metoprolol succinate (TOPROL-XL) 25 MG 24 hr tablet Take 25 mg by mouth daily.        . Multiple Vitamin (MULTIVITAMIN) tablet Take 1 tablet by mouth daily.        . polyethylene glycol (MIRALAX / GLYCOLAX) packet Take 17 g by mouth every other day.          Not on File  Past Medical History  Diagnosis Date  . CVA (cerebrovascular accident)   . SVT (supraventricular tachycardia)     nonsustained  . Mitral insufficiency     CHRONIC  . Hypertension   . Debility     GENERALIZED  . Hyperlipidemia   . OA (osteoarthritis)   . OP (osteoporosis)   . CHF (congestive heart failure)   . History of diastolic dysfunction     Past Surgical History  Procedure Date  . Removal colon polpys   . Tonsillectomy and adenoidectomy      History  Smoking status  . Never Smoker   Smokeless tobacco  . Not on file    History  Alcohol Use No    Family History  Problem Relation Age of Onset  . Heart disease Mother     Review of Systems: The review of systems is positive for loss of hearing.  She gets around predominantly in a wheelchair. She does have significant degenerative joint disease.All other systems were reviewed and are negative.  Physical Exam: BP 110/62  Pulse 82  Ht 5' (1.524 m)  Wt 115 lb (52.164 kg)  BMI 22.46 kg/m2 She is an elderly white female in no acute distress. She is hard of hearing. Her HEENT exam is unremarkable. She has no JVD or bruits. Lungs are fairly clear. Cardiac exam reveals a harsh grade 2/6 systolic murmur at apex. Abdomen is soft and nontender. She has trace pretibial edema. LABORATORY DATA: Labs reviewed from October 2012 showed normal liver function studies and TSH. Blood counts were normal. Renal function was normal.  Assessment / Plan:

## 2011-03-15 NOTE — Assessment & Plan Note (Signed)
Well-controlled on beta-blocker therapy. 

## 2011-03-26 ENCOUNTER — Inpatient Hospital Stay (HOSPITAL_COMMUNITY)
Admission: EM | Admit: 2011-03-26 | Discharge: 2011-03-29 | DRG: 065 | Disposition: A | Discharge status: Skilled Nursing Facility | Payer: Medicare Other | Attending: Internal Medicine | Admitting: Internal Medicine

## 2011-03-26 ENCOUNTER — Encounter (HOSPITAL_COMMUNITY): Payer: Self-pay | Admitting: *Deleted

## 2011-03-26 ENCOUNTER — Emergency Department (HOSPITAL_COMMUNITY): Payer: Medicare Other

## 2011-03-26 ENCOUNTER — Other Ambulatory Visit: Payer: Self-pay

## 2011-03-26 DIAGNOSIS — I509 Heart failure, unspecified: Secondary | ICD-10-CM | POA: Diagnosis present

## 2011-03-26 DIAGNOSIS — J329 Chronic sinusitis, unspecified: Secondary | ICD-10-CM | POA: Diagnosis present

## 2011-03-26 DIAGNOSIS — I6350 Cerebral infarction due to unspecified occlusion or stenosis of unspecified cerebral artery: Principal | ICD-10-CM | POA: Diagnosis present

## 2011-03-26 DIAGNOSIS — I10 Essential (primary) hypertension: Secondary | ICD-10-CM | POA: Diagnosis present

## 2011-03-26 DIAGNOSIS — E039 Hypothyroidism, unspecified: Secondary | ICD-10-CM | POA: Diagnosis present

## 2011-03-26 DIAGNOSIS — R5381 Other malaise: Secondary | ICD-10-CM | POA: Diagnosis present

## 2011-03-26 DIAGNOSIS — J209 Acute bronchitis, unspecified: Secondary | ICD-10-CM | POA: Diagnosis present

## 2011-03-26 DIAGNOSIS — I214 Non-ST elevation (NSTEMI) myocardial infarction: Secondary | ICD-10-CM

## 2011-03-26 DIAGNOSIS — F0150 Vascular dementia without behavioral disturbance: Secondary | ICD-10-CM | POA: Diagnosis present

## 2011-03-26 DIAGNOSIS — J069 Acute upper respiratory infection, unspecified: Secondary | ICD-10-CM

## 2011-03-26 DIAGNOSIS — I5032 Chronic diastolic (congestive) heart failure: Secondary | ICD-10-CM | POA: Diagnosis present

## 2011-03-26 DIAGNOSIS — Z8679 Personal history of other diseases of the circulatory system: Secondary | ICD-10-CM

## 2011-03-26 DIAGNOSIS — R4701 Aphasia: Secondary | ICD-10-CM | POA: Diagnosis present

## 2011-03-26 DIAGNOSIS — E785 Hyperlipidemia, unspecified: Secondary | ICD-10-CM | POA: Diagnosis present

## 2011-03-26 DIAGNOSIS — G459 Transient cerebral ischemic attack, unspecified: Secondary | ICD-10-CM

## 2011-03-26 DIAGNOSIS — R4789 Other speech disturbances: Secondary | ICD-10-CM | POA: Diagnosis present

## 2011-03-26 DIAGNOSIS — I672 Cerebral atherosclerosis: Secondary | ICD-10-CM | POA: Diagnosis present

## 2011-03-26 DIAGNOSIS — R058 Other specified cough: Secondary | ICD-10-CM

## 2011-03-26 DIAGNOSIS — J019 Acute sinusitis, unspecified: Secondary | ICD-10-CM | POA: Diagnosis present

## 2011-03-26 DIAGNOSIS — Z8673 Personal history of transient ischemic attack (TIA), and cerebral infarction without residual deficits: Secondary | ICD-10-CM

## 2011-03-26 DIAGNOSIS — R531 Weakness: Secondary | ICD-10-CM | POA: Diagnosis present

## 2011-03-26 DIAGNOSIS — R509 Fever, unspecified: Secondary | ICD-10-CM | POA: Diagnosis present

## 2011-03-26 DIAGNOSIS — I639 Cerebral infarction, unspecified: Secondary | ICD-10-CM | POA: Diagnosis present

## 2011-03-26 DIAGNOSIS — Z7982 Long term (current) use of aspirin: Secondary | ICD-10-CM

## 2011-03-26 HISTORY — DX: Other chronic pain: G89.29

## 2011-03-26 HISTORY — DX: Hypothyroidism, unspecified: E03.9

## 2011-03-26 HISTORY — DX: Dorsalgia, unspecified: M54.9

## 2011-03-26 HISTORY — DX: Muscle weakness (generalized): M62.81

## 2011-03-26 HISTORY — DX: Other specified cough: R05.8

## 2011-03-26 HISTORY — DX: Repeated falls: R29.6

## 2011-03-26 HISTORY — DX: Difficulty in walking, not elsewhere classified: R26.2

## 2011-03-26 HISTORY — DX: Metabolic encephalopathy: G93.41

## 2011-03-26 HISTORY — DX: Cardiac murmur, unspecified: R01.1

## 2011-03-26 HISTORY — DX: Peripheral vascular disease, unspecified: I73.9

## 2011-03-26 HISTORY — DX: Unspecified hearing loss, unspecified ear: H91.90

## 2011-03-26 HISTORY — DX: Cough: R05

## 2011-03-26 HISTORY — DX: Other skin changes: R23.8

## 2011-03-26 HISTORY — DX: Spontaneous ecchymoses: R23.3

## 2011-03-26 LAB — CBC
MCH: 30.9 pg (ref 26.0–34.0)
MCHC: 33.6 g/dL (ref 30.0–36.0)
MCV: 91.8 fL (ref 78.0–100.0)
Platelets: 179 10*3/uL (ref 150–400)
RDW: 14.8 % (ref 11.5–15.5)
WBC: 7.4 10*3/uL (ref 4.0–10.5)

## 2011-03-26 LAB — POCT I-STAT TROPONIN I: Troponin i, poc: 0.38 ng/mL (ref 0.00–0.08)

## 2011-03-26 LAB — DIFFERENTIAL
Basophils Absolute: 0 10*3/uL (ref 0.0–0.1)
Basophils Relative: 0 % (ref 0–1)
Eosinophils Absolute: 0 10*3/uL (ref 0.0–0.7)
Eosinophils Relative: 0 % (ref 0–5)
Monocytes Absolute: 0.9 10*3/uL (ref 0.1–1.0)

## 2011-03-26 LAB — BASIC METABOLIC PANEL
Calcium: 9.7 mg/dL (ref 8.4–10.5)
Creatinine, Ser: 0.81 mg/dL (ref 0.50–1.10)
GFR calc non Af Amer: 58 mL/min — ABNORMAL LOW (ref >90)
Sodium: 138 mEq/L (ref 135–145)

## 2011-03-26 MED ORDER — ASPIRIN 81 MG PO CHEW
324.0000 mg | CHEWABLE_TABLET | Freq: Once | ORAL | Status: AC
  Administered 2011-03-26: 324 mg via ORAL
  Filled 2011-03-26: qty 1

## 2011-03-26 NOTE — ED Provider Notes (Signed)
9:49 PM  Date: 03/26/2011  Rate: 81  Rhythm: normal sinus rhythm  QRS Axis: left  Intervals: normal  ST/T Wave abnormalities: normal  Conduction Disutrbances:right bundle branch block and left anterior fascicular block  Narrative Interpretation: Abnormal EKG  Old EKG Reviewed: unchanged    Carleene Cooper III, MD 03/26/11 2150

## 2011-03-26 NOTE — ED Notes (Addendum)
Pt brought by [facility] from [facility] to ED.  Staff states they noticed pt leaning to the R side and slurred speech some time this am - hx of tia's.  When the evening staff arrived, they called EMS.  Per EMS, no slurred speech or R sided weakness noted.  Last seen normal was this am. Pt AO x 4 per ems.

## 2011-03-26 NOTE — ED Provider Notes (Signed)
History     CSN: [id number]  Arrival date & time 03/26/11  1958   First MD Initiated Contact with Patient 03/26/11 2011      Chief Complaint  Patient presents with  . Weakness    L sided w/slurred speach, resolved    (Consider location/radiation/quality/duration/timing/severity/associated sxs/prior treatment) HPI History provided by pt and her daughter.  Patient's daughter reports that pt had a CVA in 2008 and only residual deficit was mild speech impairment (described as tripping over her words when tired or stressed).  Had a second CVA in 04/2010 which exacerbated this problem, but no other residual deficits.  Pt currently lives at [facility].  Her daughter noticed approx 3 weeks ago that her speech was getting worse.  Nursing staff informed her today that while sitting at dinner table this evening, she had a brief episode of staring into space, hand tremors and slouching to the side.  They walked her back to her room and she was more unsteady than usual.  Pt was unaware of episode afterwards.  She was incontinent of urine sometime this evening, which is abnormal for her.  She is currently mentating at baseline.  Her daughter is concerned that she may have had another stroke.  No known head trauma.  Is not anti-coagulated.  Recent illnesses include worse than baseline cough and lowered voice, which often occurs when she has a cold.  No fevers. Patient's roommate has had cough and congestion.  Pt denies having headache, vision changes, dyphagia or pain anywhere.    Past Medical History  Diagnosis Date  . CVA (cerebrovascular accident)   . SVT (supraventricular tachycardia)     nonsustained  . Mitral insufficiency     CHRONIC  . Hypertension   . Debility     GENERALIZED  . Hyperlipidemia   . OA (osteoarthritis)   . OP (osteoporosis)   . CHF (congestive heart failure)   . History of diastolic dysfunction   . Muscle weakness (generalized)   . Difficulty walking   . Metabolic  encephalopathy   . Hypothyroidism     Past Surgical History  Procedure Date  . Removal colon polpys   . Tonsillectomy and adenoidectomy     Family History  Problem Relation Age of Onset  . Heart disease Mother     History  Substance Use Topics  . Smoking status: Never Smoker   . Smokeless tobacco: Not on file  . Alcohol Use: No    OB History    Grav Para Term Preterm Abortions TAB SAB Ect Mult Living                  Review of Systems  All other systems reviewed and are negative.    Allergies  Review of patient's allergies indicates no known allergies.  Home Medications   Current Outpatient Rx  Name Route Sig Dispense Refill  . ACETAMINOPHEN ER 650 MG PO TBCR Oral Take 650 mg by mouth 2 (two) times daily.      . ALENDRONATE SODIUM 70 MG PO TABS Oral Take 70 mg by mouth every 7 (seven) days. Takes on Sundays. Take with a full glass of water on an empty stomach.    . ASPIRIN 81 MG PO TABS Oral Take 81 mg by mouth daily.      Marland Kitchen CALCIUM 600 + D PO Oral Take 1 tablet by mouth 2 (two) times daily.      Marland Kitchen LEVOTHYROXINE SODIUM 25 MCG PO TABS Oral Take 25  mcg by mouth daily.      Marland Kitchen METOPROLOL SUCCINATE ER 25 MG PO TB24 Oral Take 25 mg by mouth daily.      Marland Kitchen ONE-DAILY MULTI VITAMINS PO TABS Oral Take 1 tablet by mouth daily.      Marland Kitchen OMEPRAZOLE 20 MG PO CPDR Oral Take 20 mg by mouth daily.      Marland Kitchen POLYETHYLENE GLYCOL 3350 PO PACK Oral Take 17 g by mouth every other day. As needed for constipation.      BP 149/80  Pulse 83  Temp(Src) 100.1 F (37.8 C) (Oral)  Resp 18  SpO2 94%  Physical Exam  Nursing note and vitals reviewed. Constitutional: She is oriented to person, place, and time. She appears well-developed and well-nourished. No distress.  HENT:  Head: Normocephalic and atraumatic.  Eyes:       Normal appearance  Neck: Normal range of motion.  Cardiovascular: Normal rate and regular rhythm.   Pulmonary/Chest: Effort normal and breath sounds normal.        coughing  Abdominal: Soft. Bowel sounds are normal. She exhibits no distension. There is no tenderness.  Musculoskeletal: Normal range of motion.       No peripheral edema or calf tenderness  Neurological: She is alert and oriented to person, place, and time. She has normal reflexes. No cranial nerve deficit or sensory deficit. She displays a negative Romberg sign. Gait normal.  Skin: Skin is warm and dry. No rash noted.  Psychiatric: She has a normal mood and affect. Her behavior is normal.    ED Course  Procedures (including critical care time)  Labs Reviewed  URINALYSIS, ROUTINE W REFLEX MICROSCOPIC - Abnormal; Notable for the following:    Protein, ur 30 (*)    All other components within normal limits  CBC - Abnormal; Notable for the following:    Hemoglobin 15.5 (*)    HCT 46.1 (*)    All other components within normal limits  BASIC METABOLIC PANEL - Abnormal; Notable for the following:    Glucose, Bld 107 (*)    GFR calc non Af Amer 58 (*)    GFR calc Af Amer 67 (*)    All other components within normal limits  POCT I-STAT TROPONIN I - Abnormal; Notable for the following:    Troponin i, poc 0.38 (*)    All other components within normal limits  URINE MICROSCOPIC-ADD ON - Abnormal; Notable for the following:    Crystals URIC ACID CRYSTALS (*)    All other components within normal limits  DIFFERENTIAL  I-STAT TROPONIN I   Dg Chest 2 View  03/26/2011  *RADIOLOGY REPORT*  Clinical Data: Altered level of consciousness.  Hypertension. Congestive heart failure.  CHEST - 2 VIEW  Comparison: 01/18/2010  Findings: Cardiomegaly noted with pulmonary venous hypertension.  Tortuous thoracic aorta noted.  Retrocardiac density is nonspecific but could reflect a hiatal hernia.  Right rib deformities are noted.  Interstitial accentuation is present in the lungs.  Biapical pleuroparenchymal scarring noted.  Sensitivity of the lateral projection is reduced due to motion artifact.  IMPRESSION:   1.  Cardiomegaly with pulmonary venous hypertension and borderline interstitial edema. 2.  Chronic right rib deformities. 3.  Retrocardiac density is nonspecific, may be from a hiatal hernia. 4.  Reduced sensitivity of the lateral projection due to motion artifact.  Original Report Authenticated By: Dellia Cloud, M.D.   Ct Head Wo Contrast  03/26/2011  *RADIOLOGY REPORT*  Clinical Data: Slurred speech.  The patient is leaning to the right.  CT HEAD WITHOUT CONTRAST  Technique:  Contiguous axial images were obtained from the base of the skull through the vertex without contrast.  Comparison: 01/18/2010  Findings: A new but likely chronic right pontine lacunar infarct is shown on image 11 of series 2, measuring 6 mm in diameter.  Stable appearance of the infarct involving the left basal ganglia noted.  The thalami and right basal ganglia appear unremarkable. Vascular calcifications noted.  Stable ex vacuo ventriculomegaly is present.  Periventricular and corona radiata white matter hypodensities are most compatible with chronic ischemic microvascular white matter disease.  No intracranial hemorrhage, mass lesion, or acute infarction is identified.  Mucoperiosteal thickening in the ethmoid air cells node along with the suspected frothy fluid and mucosal thickening in the right sphenoid sinus.  IMPRESSION:  1.  An interval right pontine lacunar infarct is sharply defined and likely chronic, although was not present on the prior CT scan from 01/18/2010 2.  Stable appearance of left basal ganglia infarct. 3. Periventricular and corona radiata white matter hypodensities are most compatible with chronic ischemic microvascular white matter disease. 4.  Acute-on-chronic right sphenoid sinusitis.  Chronic ethmoid sinusitis.  Original Report Authenticated By: Dellia Cloud, M.D.     1. Non-STEMI (non-ST elevated myocardial infarction)   2. TIA (transient ischemic attack)   3. Viral URI       MDM  Pt  brought to ED by her daughter.  H/o CVA.  Had an episode of ALOC, hand tremor and urinary incontinence followed by unsteady gait at dinner this evening.  She is currently mentating at baseline.  Pt denies any other neuro complaints or CP/SOB.  Has had worse than baseline cough recently but no fever.  No acute findings on exam other than coughing.  EKG non-ischemic.  Troponin elevated at 0.38.  CXR, U/A and CT head pending.  [facility] consulted for admission.  Pt has received aspirin.  10:35 PM   CT head shows lacunar infarct, likely chronic but new since 12/2009.  CXR neg for pneumonia.  Cardiology has seen pt and is unimpressed by elevated troponin.  They believe this is chronic and related to her diastolic heart failure.  Recommend medical admission w/ neurology consult.  Pt currently stable.  [facility] consulted for admission.  12:31 AM   Medical screening examination/treatment/procedure(s) were conducted as a shared visit with non-physician practitioner(s) and myself.  I personally evaluated the patient during the encounter 78 yo lady from [facility] had staring spell and hand tremor this evening.  She has a prior history of stroke.  Exam now shows her awake and alert, somewhat deaf, no focal findings.  TNI was elevated so [facility] consulted on pt.  They did not think her elevated TNI was significant in her current situation and did not recommend further cardiology workup.  CT of the head showed a lacunar stroke that was new since 01/18/2010.  Dx TIA, admit to [facility]. Osvaldo Human, M.D.    Arie Sabina Sumpter, PA 03/27/11 0040  Carleene Cooper III, MD 03/27/11 [phone]

## 2011-03-26 NOTE — ED Notes (Signed)
Troponin results given to Dr. Ignacia Palma by B. Bing Plume, EMT

## 2011-03-27 ENCOUNTER — Encounter (HOSPITAL_COMMUNITY): Payer: Self-pay | Admitting: Internal Medicine

## 2011-03-27 ENCOUNTER — Inpatient Hospital Stay (HOSPITAL_COMMUNITY): Payer: Medicare Other

## 2011-03-27 DIAGNOSIS — G459 Transient cerebral ischemic attack, unspecified: Secondary | ICD-10-CM | POA: Insufficient documentation

## 2011-03-27 DIAGNOSIS — R7989 Other specified abnormal findings of blood chemistry: Secondary | ICD-10-CM

## 2011-03-27 DIAGNOSIS — R509 Fever, unspecified: Secondary | ICD-10-CM | POA: Diagnosis present

## 2011-03-27 DIAGNOSIS — J019 Acute sinusitis, unspecified: Secondary | ICD-10-CM | POA: Diagnosis present

## 2011-03-27 DIAGNOSIS — R531 Weakness: Secondary | ICD-10-CM | POA: Diagnosis present

## 2011-03-27 DIAGNOSIS — J209 Acute bronchitis, unspecified: Secondary | ICD-10-CM | POA: Diagnosis present

## 2011-03-27 LAB — CBC
HCT: 41.1 % (ref 36.0–46.0)
Hemoglobin: 13.2 g/dL (ref 12.0–15.0)
MCH: 29.5 pg (ref 26.0–34.0)
MCHC: 32.1 g/dL (ref 30.0–36.0)
MCV: 91.9 fL (ref 78.0–100.0)
RBC: 4.47 MIL/uL (ref 3.87–5.11)

## 2011-03-27 LAB — CARDIAC PANEL(CRET KIN+CKTOT+MB+TROPI)
CK, MB: 3 ng/mL (ref 0.3–4.0)
CK, MB: 3.2 ng/mL (ref 0.3–4.0)
Total CK: 82 U/L (ref 7–177)
Troponin I: <0.3 ng/mL (ref <0.30)
Troponin I: <0.3 ng/mL (ref <0.30)

## 2011-03-27 LAB — GLUCOSE, CAPILLARY
Glucose-Capillary: 75 mg/dL (ref 70–99)
Glucose-Capillary: 84 mg/dL (ref 70–99)
Glucose-Capillary: 80 mg/dL (ref 70–99)
Glucose-Capillary: 102 mg/dL — ABNORMAL HIGH (ref 70–99)

## 2011-03-27 LAB — COMPREHENSIVE METABOLIC PANEL
Albumin: 3 g/dL — ABNORMAL LOW (ref 3.5–5.2)
BUN: 18 mg/dL (ref 6–23)
CO2: 24 mEq/L (ref 19–32)
Calcium: 8.6 mg/dL (ref 8.4–10.5)
Chloride: 102 mEq/L (ref 96–112)
Creatinine, Ser: 0.77 mg/dL (ref 0.50–1.10)
GFR calc non Af Amer: 67 mL/min — ABNORMAL LOW (ref >90)
Total Bilirubin: 0.5 mg/dL (ref 0.3–1.2)

## 2011-03-27 LAB — URINE MICROSCOPIC-ADD ON

## 2011-03-27 LAB — URINALYSIS, ROUTINE W REFLEX MICROSCOPIC
Bilirubin Urine: NEGATIVE
Hgb urine dipstick: NEGATIVE
Ketones, ur: NEGATIVE mg/dL
Protein, ur: 30 mg/dL — AB
Urobilinogen, UA: 0.2 mg/dL (ref 0.0–1.0)

## 2011-03-27 LAB — TSH: TSH: 1.146 u[IU]/mL (ref 0.350–4.500)

## 2011-03-27 LAB — LIPID PANEL
Cholesterol: 214 mg/dL — ABNORMAL HIGH (ref 0–200)
HDL: 41 mg/dL (ref >39)
Triglycerides: 128 mg/dL (ref <150)

## 2011-03-27 LAB — RAPID URINE DRUG SCREEN, HOSP PERFORMED
Cocaine: NOT DETECTED
Opiates: NOT DETECTED
Tetrahydrocannabinol: NOT DETECTED

## 2011-03-27 LAB — HEMOGLOBIN A1C
Hgb A1c MFr Bld: 5.6 % (ref <5.7)
Mean Plasma Glucose: 114 mg/dL (ref <117)

## 2011-03-27 MED ORDER — ONDANSETRON HCL 4 MG/2ML IJ SOLN: 4.0000 mg | Freq: Four times a day (QID) | INTRAMUSCULAR | Status: DC | PRN

## 2011-03-27 MED ORDER — ACETAMINOPHEN 325 MG PO TABS
650.0000 mg | ORAL_TABLET | Freq: Four times a day (QID) | ORAL | Status: DC | PRN
  Filled 2011-03-27 (×2): qty 2

## 2011-03-27 MED ORDER — MOXIFLOXACIN HCL IN NACL 400 MG/250ML IV SOLN
400.0000 mg | INTRAVENOUS | Status: DC
  Administered 2011-03-27 – 2011-03-28 (×3): 400 mg via INTRAVENOUS
  Filled 2011-03-27 (×4): qty 250

## 2011-03-27 MED ORDER — LEVOTHYROXINE SODIUM 25 MCG PO TABS
25.0000 ug | ORAL_TABLET | Freq: Every day | ORAL | Status: DC
  Administered 2011-03-27 – 2011-03-29 (×3): 25 ug via ORAL
  Filled 2011-03-27 (×4): qty 1

## 2011-03-27 MED ORDER — ASPIRIN EC 81 MG PO TBEC
81.0000 mg | DELAYED_RELEASE_TABLET | Freq: Every day | ORAL | Status: DC
  Filled 2011-03-27: qty 1

## 2011-03-27 MED ORDER — SODIUM CHLORIDE 0.9 % IV SOLN
INTRAVENOUS | Status: AC
  Administered 2011-03-27: 08:00:00 via INTRAVENOUS

## 2011-03-27 MED ORDER — POLYETHYLENE GLYCOL 3350 17 G PO PACK
17.0000 g | PACK | ORAL | Status: DC
  Administered 2011-03-27 – 2011-03-29 (×2): 17 g via ORAL
  Filled 2011-03-27 (×2): qty 1

## 2011-03-27 MED ORDER — METOPROLOL SUCCINATE ER 25 MG PO TB24
25.0000 mg | ORAL_TABLET | Freq: Every day | ORAL | Status: DC
  Filled 2011-03-27: qty 1

## 2011-03-27 MED ORDER — HYDROCORTISONE ACETATE 25 MG RE SUPP: 25.0000 mg | Freq: Two times a day (BID) | RECTAL | Status: DC | PRN

## 2011-03-27 MED ORDER — ACETAMINOPHEN 650 MG RE SUPP: 650.0000 mg | Freq: Four times a day (QID) | RECTAL | Status: DC | PRN

## 2011-03-27 MED ORDER — ADULT MULTIVITAMIN W/MINERALS CH
1.0000 | ORAL_TABLET | Freq: Every day | ORAL | Status: DC
  Administered 2011-03-27 – 2011-03-29 (×3): 1 via ORAL
  Filled 2011-03-27 (×3): qty 1

## 2011-03-27 MED ORDER — ALBUTEROL SULFATE (5 MG/ML) 0.5% IN NEBU: 2.5000 mg | INHALATION_SOLUTION | RESPIRATORY_TRACT | Status: DC | PRN

## 2011-03-27 MED ORDER — SODIUM CHLORIDE 0.9 % IV SOLN
INTRAVENOUS | Status: DC
  Administered 2011-03-27: 05:00:00 via INTRAVENOUS

## 2011-03-27 MED ORDER — ALENDRONATE SODIUM 70 MG PO TABS: 70.0000 mg | ORAL_TABLET | ORAL | Status: DC

## 2011-03-27 MED ORDER — ENOXAPARIN SODIUM 30 MG/0.3ML ~~LOC~~ SOLN
30.0000 mg | Freq: Every day | SUBCUTANEOUS | Status: DC
  Administered 2011-03-27 – 2011-03-29 (×3): 30 mg via SUBCUTANEOUS
  Filled 2011-03-27 (×3): qty 0.3

## 2011-03-27 MED ORDER — STROKE: EARLY STAGES OF RECOVERY BOOK
Freq: Once | Status: AC
  Administered 2011-03-27: 17:00:00
  Filled 2011-03-27: qty 1

## 2011-03-27 MED ORDER — GUAIFENESIN ER 600 MG PO TB12
600.0000 mg | ORAL_TABLET | Freq: Two times a day (BID) | ORAL | Status: DC
  Administered 2011-03-27 – 2011-03-29 (×5): 600 mg via ORAL
  Filled 2011-03-27 (×6): qty 1

## 2011-03-27 MED ORDER — ACETAMINOPHEN 325 MG PO TABS
650.0000 mg | ORAL_TABLET | Freq: Two times a day (BID) | ORAL | Status: DC
  Administered 2011-03-27 – 2011-03-29 (×5): 650 mg via ORAL
  Filled 2011-03-27 (×2): qty 2
  Filled 2011-03-27: qty 1
  Filled 2011-03-27 (×4): qty 2

## 2011-03-27 MED ORDER — METOPROLOL SUCCINATE 12.5 MG HALF TABLET
12.5000 mg | ORAL_TABLET | Freq: Every day | ORAL | Status: DC
  Administered 2011-03-28 – 2011-03-29 (×2): 12.5 mg via ORAL
  Filled 2011-03-27 (×2): qty 1

## 2011-03-27 MED ORDER — ONDANSETRON HCL 4 MG PO TABS: 4.0000 mg | ORAL_TABLET | Freq: Four times a day (QID) | ORAL | Status: DC | PRN

## 2011-03-27 MED ORDER — PANTOPRAZOLE SODIUM 40 MG PO TBEC
40.0000 mg | DELAYED_RELEASE_TABLET | Freq: Every day | ORAL | Status: DC
  Administered 2011-03-27 – 2011-03-29 (×3): 40 mg via ORAL
  Filled 2011-03-27 (×3): qty 1

## 2011-03-27 MED ORDER — ASPIRIN 325 MG PO TABS
325.0000 mg | ORAL_TABLET | Freq: Every day | ORAL | Status: DC
  Administered 2011-03-27 – 2011-03-29 (×3): 325 mg via ORAL
  Filled 2011-03-27 (×3): qty 1

## 2011-03-27 NOTE — Progress Notes (Signed)
Occupational Therapy Evaluation Patient Details Name: Julie Frank MRN: [id number] DOB: 1909-04-05 Today's Date: 03/27/2011  Problem List:  Patient Active Problem List  Diagnoses  . CVA (cerebrovascular accident)  . SVT (supraventricular tachycardia)  . Mitral insufficiency  . Hypertension  . CHF (congestive heart failure)  . History of diastolic dysfunction  . Febrile illness, acute  . Weakness generalized  . Sinusitis acute  . Bronchitis, acute  . TIA (transient ischemic attack)  . Troponin I above reference range    Past Medical History:  Past Medical History  Diagnosis Date  . Mitral insufficiency     CHRONIC  . Hypertension   . Debility     GENERALIZED  . Hyperlipidemia   . OA (osteoarthritis)   . OP (osteoporosis)   . CHF (congestive heart failure)   . History of diastolic dysfunction   . Muscle weakness (generalized)   . Difficulty walking   . Metabolic encephalopathy   . Hypothyroidism   . HOH (hard of hearing)     "extremely"  . SVT (supraventricular tachycardia)     nonsustained  . Heart murmur   . Repeated falls 2010    "3 serious falls; hit head each time; staples 1st 2 times"  . Vascular dementia dx'd 2011  . Peripheral vascular disease   . Dry cough 03/26/11    "chronic; dx'd years ago as allergy related type of thing"  . Bruises easily   . Chronic back pain greater than 3 months duration   . CVA (cerebrovascular accident) 03/2006; 04/2009    residual "speech problems & short term memory decreased"   Past Surgical History:  Past Surgical History  Procedure Date  . Removal colon polpys 12/2000    Villous adenoma of the cecum.  . Tonsillectomy and adenoidectomy   . Appendectomy   . Cataract extraction w/ intraocular lens  implant, bilateral 1984  . Dilation and curettage of uterus 1960's    "several"    OT Assessment/Plan/Recommendation OT Assessment Clinical Impression Statement: Pt s/p CVA and demonstrates with low activity tolerance  and fatigues easily.  Pt with heavy posterior lean when sitting EOB.  Unable to contact SNF at this time for PLOF infomation. Will adjust goals and frequency as more information is received regarding PLOF. Will follow acutely to increase I with ADLs and functional transfers. OT Recommendation/Assessment: Patient will need skilled OT in the acute care venue OT Problem List: Decreased activity tolerance;Decreased strength;Impaired balance (sitting and/or standing);Decreased coordination;Decreased knowledge of use of DME or AE OT Therapy Diagnosis : Generalized weakness OT Plan OT Frequency: Min 1X/week OT Treatment/Interventions: Self-care/ADL training;DME and/or AE instruction;Therapeutic activities;Patient/family education;Balance training OT Recommendation Follow Up Recommendations: Skilled nursing facility Equipment Recommended: Defer to next venue Individuals Consulted Consulted and Agree with Results and Recommendations: Patient OT Goals Acute Rehab OT Goals OT Goal Formulation: With patient Time For Goal Achievement: 2 weeks ADL Goals Pt Will Perform Grooming: with set-up;Unsupported;Sitting, edge of bed ADL Goal: Grooming - Progress: Not met Pt Will Transfer to Toilet: Stand pivot transfer;with DME;3-in-1;with min assist ADL Goal: Toilet Transfer - Progress: Not met Pt Will Perform Toileting - Hygiene: with min assist;Sitting on 3-in-1 or toilet;Standing at 3-in-1/toilet ADL Goal: Toileting - Hygiene - Progress: Not met Miscellaneous OT Goals Miscellaneous OT Goal #1: Pt will perform  bed mobility with supervision in prep for EOB ADLs. OT Goal: Miscellaneous Goal #1 - Progress: Not met  OT Evaluation Precautions/Restrictions  Precautions Precautions: Fall Prior Functioning Home Living Lives With: Other (  Comment) Counselling psychologist at [facility]) Receives Help From:  (Staff at SNF) Type of Home: Skilled Nursing Facility Home Adaptive Equipment: Walker - rolling Prior Function Level of  Independence: Needs assistance with ADLs;Needs assistance with tranfers Comments: Pt is an unreliable historian; but shows good awareness of defecits ADL ADL Grooming: Simulated;Minimal assistance Grooming Details (indicate cue type and reason): Min assist due to bil UE weakness Where Assessed - Grooming: Sitting, bed;Supported Upper Body Bathing: Simulated;Minimal assistance Where Assessed - Upper Body Bathing: Sitting, bed;Supported Lower Body Bathing: Simulated;+1 Total assistance Where Assessed - Lower Body Bathing: Sit to stand from bed;Supported Upper Body Dressing: Simulated;Minimal assistance Upper Body Dressing Details (indicate cue type and reason): Min assist threading bil UE through gown Where Assessed - Upper Body Dressing: Sitting, bed;Supported Lower Body Dressing: +1 Total assistance;Simulated Where Assessed - Lower Body Dressing: Sit to stand from bed;Supported Toilet Transfer: Simulated;+2 Total assistance;Comment for patient % (40%) Toilet Transfer Details (indicate cue type and reason): Pt side stepped to [facility]. Toilet Transfer Method: Other (comment) (side step) Toileting - Clothing Manipulation: Performed;+1 Total assistance Where Assessed - Toileting Clothing Manipulation: Standing Toileting - Hygiene: Performed;+2 Total assistance;Comment for patient % (0%) Toileting - Hygiene Details (indicate cue type and reason): +1 for hygiene and second person for steadying while standing Where Assessed - Toileting Hygiene: Standing Equipment Used: Rolling walker ADL Comments: ADLs limited by pt's level of fatigue Vision/Perception   Difficult to assess at this time due to pt's difficulty following commands.  Will assess more fully at next session. Cognition Cognition Arousal/Alertness: Other (comment) (asleep, easily arousable; was incontinent of urine) Overall Cognitive Status: History of cognitive impairments History of Cognitive Impairment:  (Will need more info re:  baseline) Orientation Level: Oriented to person Sensation/Coordination Sensation Light Touch: Appears Intact Coordination Gross Motor Movements are Fluid and Coordinated: No Fine Motor Movements are Fluid and Coordinated: No Coordination and Movement Description: slow moving, tactile cueing to initiate; innefficient movement Extremity Assessment RUE Assessment RUE Assessment: Exceptions to [facility] RUE Overall AROM Comments: WFL RUE Strength RUE Overall Strength Comments: 3/5 grossly LUE Assessment LUE Assessment: Exceptions to WFL LUE Overall AROM Comments: WFL for tasks LUE Strength LUE Overall Strength Comments: 2-/5 grossly Mobility  Bed Mobility Bed Mobility: Yes Supine to Sit: 1: +1 Total assist (pt=40%) Supine to Sit Details (indicate cue type and reason): cues to initiate; tactile cueing for lower and upper trunk rotation to right; required physical assist to clear feet off EOB; tending to keep bil LEs in slightly more extension than normal once EOB; question incr tone versus not understanding goal of task Sitting - Scoot to Edge of Bed: 3: Mod assist;With rail Sitting - Scoot to [place] of Bed Details (indicate cue type and reason): cues to initiate; required physical assist to weight shift and reciprocal scoot to EOB; was able to reciprocal scoot (though short, innefficient scooting) back towards middle of bed in prep for laying down Transfers Sit to Stand: 3: Mod assist;From bed;With upper extremity assist (UEs on RW) Sit to Stand Details (indicate cue type and reason): Required physical assist to steady RW and keep feet planted on floor secondary to noted tendency for feet to slide anteriorly (incr tone?) Stand to Sit: 3: Mod assist;To bed Stand to Sit Details: physical assist to control descent Exercises   End of Session OT - End of Session Activity Tolerance: Patient limited by fatigue Patient left: in bed;with call bell in reach General Behavior During Session: [facility] for  tasks performed Cognition: [facility]  for tasks performed   Cipriano Mile 03/27/2011, 5:22 PM  03/27/2011 Cipriano Mile OTR/L Pager [phone] Office [phone]

## 2011-03-27 NOTE — H&P (Signed)
Julie Frank is an 78 y.o. female.   Chief Complaint: Weakness, Confusion HPI: 78 Yo resident of NH brought in by daughter after an episode of confusion and weakness witnessed today. Patient has history of recurrent CVA. Has had residual speech difficulty but that has gotten worse lately. Also had elevated troponin. Seen by Cardiology in ED and believed not significant. Has been having cough, low grade fever and her room mate has been sick also.    Past Medical History  Diagnosis Date  . Mitral insufficiency     CHRONIC  . Hypertension   . Debility     GENERALIZED  . Hyperlipidemia   . OA (osteoarthritis)   . OP (osteoporosis)   . CHF (congestive heart failure)   . History of diastolic dysfunction   . Muscle weakness (generalized)   . Difficulty walking   . Metabolic encephalopathy   . Hypothyroidism   . HOH (hard of hearing)     "extremely"  . SVT (supraventricular tachycardia)     nonsustained  . Heart murmur   . Repeated falls 2010    "3 serious falls; hit head each time; staples 1st 2 times"  . Vascular dementia dx'd 2011  . Peripheral vascular disease   . Dry cough 03/26/11    "chronic; dx'd years ago as allergy related type of thing"  . Bruises easily   . Chronic back pain greater than 3 months duration   . CVA (cerebrovascular accident) 03/2006; 04/2009    residual "speech problems & short term memory decreased"    Past Surgical History  Procedure Date  . Removal colon polpys 12/2000    Villous adenoma of the cecum.  . Tonsillectomy and adenoidectomy   . Appendectomy   . Cataract extraction w/ intraocular lens  implant, bilateral 1984  . Dilation and curettage of uterus 1960's    "several"    Family History  Problem Relation Age of Onset  . Heart disease Mother    Social History:  reports that she has never smoked. She has never used smokeless tobacco. She reports that she does not drink alcohol or use illicit drugs.  Allergies: No Known  Allergies  Medications Prior to Admission  Medication Dose Route Frequency Provider Last Rate Last Dose  . 0.9 %  sodium chloride infusion   Intravenous Continuous Lawal Garba 75 mL/hr at 03/27/11 0445    . acetaminophen (TYLENOL) tablet 650 mg  650 mg Oral Q6H PRN Lawal Garba       Or  . acetaminophen (TYLENOL) suppository 650 mg  650 mg Rectal Q6H PRN Lawal Garba      . acetaminophen (TYLENOL) tablet 650 mg  650 mg Oral BID Lawal Garba      . albuterol (PROVENTIL) (5 MG/ML) 0.5% nebulizer solution 2.5 mg  2.5 mg Nebulization Q2H PRN Lawal Garba      . aspirin chewable tablet 324 mg  324 mg Oral Once Otilio Miu, PA   324 mg at 03/26/11 2309  . aspirin EC tablet 81 mg  81 mg Oral Daily Lawal Garba      . aspirin tablet 325 mg  325 mg Oral Daily Lawal Garba      . enoxaparin (LOVENOX) injection 30 mg  30 mg Subcutaneous Daily Lawal Garba      . guaiFENesin (MUCINEX) 12 hr tablet 600 mg  600 mg Oral BID Lawal Garba      . hydrocortisone (ANUSOL-HC) suppository 25 mg  25 mg Rectal BID PRN  Lawal Garba      . levothyroxine (SYNTHROID, LEVOTHROID) tablet 25 mcg  25 mcg Oral Q breakfast Lawal Garba      . metoprolol succinate (TOPROL-XL) 24 hr tablet 25 mg  25 mg Oral Daily Lawal Garba      . moxifloxacin (AVELOX) IVPB 400 mg  400 mg Intravenous Q24H Lawal Garba   400 mg at 03/27/11 0201  . mulitivitamin with minerals tablet 1 tablet  1 tablet Oral Daily Lawal Garba      . ondansetron (ZOFRAN) tablet 4 mg  4 mg Oral Q6H PRN Lawal Garba       Or  . ondansetron (ZOFRAN) injection 4 mg  4 mg Intravenous Q6H PRN Lawal Garba      . pantoprazole (PROTONIX) EC tablet 40 mg  40 mg Oral Q1200 Lawal Garba      . polyethylene glycol (MIRALAX / GLYCOLAX) packet 17 g  17 g Oral QODAY Lawal Garba      . DISCONTD: alendronate (FOSAMAX) tablet 70 mg  70 mg Oral Q7 days Lawal Garba       Medications Prior to Admission  Medication Sig Dispense Refill  . acetaminophen (TYLENOL ARTHRITIS PAIN) 650 MG  CR tablet Take 650 mg by mouth 2 (two) times daily.        Marland Kitchen alendronate (FOSAMAX) 70 MG tablet Take 70 mg by mouth every 7 (seven) days. Takes on Sundays. Take with a full glass of water on an empty stomach.      Marland Kitchen aspirin 81 MG tablet Take 81 mg by mouth daily.        Marland Kitchen levothyroxine (SYNTHROID, LEVOTHROID) 25 MCG tablet Take 25 mcg by mouth daily.        . metoprolol succinate (TOPROL-XL) 25 MG 24 hr tablet Take 25 mg by mouth daily.        . Multiple Vitamin (MULTIVITAMIN) tablet Take 1 tablet by mouth daily.        . polyethylene glycol (MIRALAX / GLYCOLAX) packet Take 17 g by mouth every other day. As needed for constipation.        Results for orders placed during the hospital encounter of 03/26/11 (from the past 48 hour(s))  CBC     Status: Abnormal   Collection Time   03/26/11  9:21 PM      Component Value Range Comment   WBC 7.4  4.0 - 10.5 (K/uL)    RBC 5.02  3.87 - 5.11 (MIL/uL)    Hemoglobin 15.5 (*) 12.0 - 15.0 (g/dL)    HCT 16.1 (*) 09.6 - 46.0 (%)    MCV 91.8  78.0 - 100.0 (fL)    MCH 30.9  26.0 - 34.0 (pg)    MCHC 33.6  30.0 - 36.0 (g/dL)    RDW 04.5  40.9 - 81.1 (%)    Platelets 179  150 - 400 (K/uL)   DIFFERENTIAL     Status: Normal   Collection Time   03/26/11  9:21 PM      Component Value Range Comment   Neutrophils Relative 75  43 - 77 (%)    Neutro Abs 5.5  1.7 - 7.7 (K/uL)    Lymphocytes Relative 13  12 - 46 (%)    Lymphs Abs 0.9  0.7 - 4.0 (K/uL)    Monocytes Relative 12  3 - 12 (%)    Monocytes Absolute 0.9  0.1 - 1.0 (K/uL)    Eosinophils Relative 0  0 - 5 (%)  Eosinophils Absolute 0.0  0.0 - 0.7 (K/uL)    Basophils Relative 0  0 - 1 (%)    Basophils Absolute 0.0  0.0 - 0.1 (K/uL)   BASIC METABOLIC PANEL     Status: Abnormal   Collection Time   03/26/11  9:21 PM      Component Value Range Comment   Sodium 138  135 - 145 (mEq/L)    Potassium 4.2  3.5 - 5.1 (mEq/L)    Chloride 99  96 - 112 (mEq/L)    CO2 26  19 - 32 (mEq/L)    Glucose, Bld 107 (*) 70  - 99 (mg/dL)    BUN 18  6 - 23 (mg/dL)    Creatinine, Ser 1.61  0.50 - 1.10 (mg/dL)    Calcium 9.7  8.4 - 10.5 (mg/dL)    GFR calc non Af Amer 58 (*) >90 (mL/min)    GFR calc Af Amer 67 (*) >90 (mL/min)   POCT I-STAT TROPONIN I     Status: Abnormal   Collection Time   03/26/11  9:55 PM      Component Value Range Comment   Troponin i, poc 0.38 (*) 0.00 - 0.08 (ng/mL)    Comment NOTIFIED PHYSICIAN      Comment 3            URINALYSIS, ROUTINE W REFLEX MICROSCOPIC     Status: Abnormal   Collection Time   03/26/11 11:59 PM      Component Value Range Comment   Color, Urine YELLOW  YELLOW     APPearance CLEAR  CLEAR     Specific Gravity, Urine 1.018  1.005 - 1.030     pH 5.5  5.0 - 8.0     Glucose, UA NEGATIVE  NEGATIVE (mg/dL)    Hgb urine dipstick NEGATIVE  NEGATIVE     Bilirubin Urine NEGATIVE  NEGATIVE     Ketones, ur NEGATIVE  NEGATIVE (mg/dL)    Protein, ur 30 (*) NEGATIVE (mg/dL)    Urobilinogen, UA 0.2  0.0 - 1.0 (mg/dL)    Nitrite NEGATIVE  NEGATIVE     Leukocytes, UA NEGATIVE  NEGATIVE    URINE MICROSCOPIC-ADD ON     Status: Abnormal   Collection Time   03/26/11 11:59 PM      Component Value Range Comment   Squamous Epithelial / LPF RARE  RARE     Bacteria, UA RARE  RARE     Crystals URIC ACID CRYSTALS (*) NEGATIVE     Dg Chest 2 View  03/26/2011  *RADIOLOGY REPORT*  Clinical Data: Altered level of consciousness.  Hypertension. Congestive heart failure.  CHEST - 2 VIEW  Comparison: 01/18/2010  Findings: Cardiomegaly noted with pulmonary venous hypertension.  Tortuous thoracic aorta noted.  Retrocardiac density is nonspecific but could reflect a hiatal hernia.  Right rib deformities are noted.  Interstitial accentuation is present in the lungs.  Biapical pleuroparenchymal scarring noted.  Sensitivity of the lateral projection is reduced due to motion artifact.  IMPRESSION:  1.  Cardiomegaly with pulmonary venous hypertension and borderline interstitial edema. 2.  Chronic right rib  deformities. 3.  Retrocardiac density is nonspecific, may be from a hiatal hernia. 4.  Reduced sensitivity of the lateral projection due to motion artifact.  Original Report Authenticated By: Dellia Cloud, M.D.   Ct Head Wo Contrast  03/26/2011  *RADIOLOGY REPORT*  Clinical Data: Slurred speech.  The patient is leaning to the right.  CT HEAD WITHOUT CONTRAST  Technique:  Contiguous axial images were obtained from the base of the skull through the vertex without contrast.  Comparison: 01/18/2010  Findings: A new but likely chronic right pontine lacunar infarct is shown on image 11 of series 2, measuring 6 mm in diameter.  Stable appearance of the infarct involving the left basal ganglia noted.  The thalami and right basal ganglia appear unremarkable. Vascular calcifications noted.  Stable ex vacuo ventriculomegaly is present.  Periventricular and corona radiata white matter hypodensities are most compatible with chronic ischemic microvascular white matter disease.  No intracranial hemorrhage, mass lesion, or acute infarction is identified.  Mucoperiosteal thickening in the ethmoid air cells node along with the suspected frothy fluid and mucosal thickening in the right sphenoid sinus.  IMPRESSION:  1.  An interval right pontine lacunar infarct is sharply defined and likely chronic, although was not present on the prior CT scan from 01/18/2010 2.  Stable appearance of left basal ganglia infarct. 3. Periventricular and corona radiata white matter hypodensities are most compatible with chronic ischemic microvascular white matter disease. 4.  Acute-on-chronic right sphenoid sinusitis.  Chronic ethmoid sinusitis.  Original Report Authenticated By: Dellia Cloud, M.D.    Review of Systems  Constitutional: Positive for malaise/fatigue.  HENT: Positive for hearing loss and congestion. Negative for sore throat.   Eyes: Negative.   Respiratory: Positive for cough and shortness of breath. Negative for  hemoptysis, sputum production, wheezing and stridor.   Cardiovascular: Negative.   Gastrointestinal: Negative.   Genitourinary: Negative.   Musculoskeletal: Negative.   Skin: Negative.   Neurological: Positive for speech change, loss of consciousness and weakness.       Transient confusion now resolved  Endo/Heme/Allergies: Negative.   Psychiatric/Behavioral: Negative.     Blood pressure 124/86, pulse 77, temperature 97.4 F (36.3 C), temperature source Axillary, resp. rate 24, height 5' (1.524 m), weight 49.487 kg (109 lb 1.6 oz), SpO2 93.00%. Physical Exam  Constitutional: She is oriented to person, place, and time. She appears distressed.       Remarkably well for her age  HENT:  Head: Normocephalic and atraumatic.  Right Ear: Decreased hearing is noted.  Left Ear: Decreased hearing is noted.  Nose: Nose normal.  Mouth/Throat: Oropharynx is clear and moist.  Eyes: Conjunctivae are normal. Pupils are equal, round, and reactive to light.  Neck: Normal range of motion. Neck supple.  Cardiovascular: Normal rate, regular rhythm, normal heart sounds and intact distal pulses.   Respiratory: Effort normal and breath sounds normal.  GI: Soft. Bowel sounds are normal.  Musculoskeletal: Normal range of motion.  Neurological: She is alert and oriented to person, place, and time. She has normal reflexes.  Skin: Skin is warm and dry. She is not diaphoretic.  Psychiatric: She has a normal mood and affect. Her behavior is normal. Judgment and thought content normal.     Assessment/Plan 1. TIA: New findings on Ct suggests sub acute CVA. Will get MRI/MRA and other CVA w/u. Continue with Aspirin 2. Generalized weakness: Multifactorial most likely. Treat underlying cause, PT/OT consult 3. Bronchitis: probably viral. Will empirically treat for bacterial as well. Give some Nebulizers. 4. Sinusitis: Per CT. Will treat 5. Abnormal Troponins: Not significant per Cards.   GARBA,LAWAL 03/27/2011,  6:29 AM

## 2011-03-27 NOTE — Consult Note (Signed)
Reason for Consult: Point of care troponin above reference range at 0.38 Referring Physician: Dr Carleene Cooper III  Julie Frank is an 78 y.o. female with a history of multiple CVAs and diastolic heart failure. HPI: Patient resides in a nursing home and her daughter brought her in today because of a brief episode of altered mental state with tremulousness of the right upper extremity and some slurring of speech noted by nursing home staff. She had some urinary incontinence during the episode and afterwards it was noted that patient was requiring more assistance to sit in stand and was slouching in her wheelchair. Her daughter also noted that for the last 1- 2 weeks, patient had not "been herself". These presentation is eerily similar to that which she had the last time she was diagnosed with a CVA. Patient is awake and alert, denies chest pain , jaw pain, arm pain or SOB. No dizziness , palpitations, orthopnea or PND. No leg edema either. She has had detectable troponin in the past and EKG is unremarkable. Of note she has a CT scan that revealed a new CVA when compared to the last CTH but well demarcated and most likely not acute.     Past Medical History  Diagnosis Date  . Mitral insufficiency     CHRONIC  . Hypertension   . Debility     GENERALIZED  . Hyperlipidemia   . OA (osteoarthritis)   . OP (osteoporosis)   . CHF (congestive heart failure)   . History of diastolic dysfunction   . Muscle weakness (generalized)   . Difficulty walking   . Metabolic encephalopathy   . Hypothyroidism   . HOH (hard of hearing)     "extremely"  . SVT (supraventricular tachycardia)     nonsustained  . Heart murmur   . Repeated falls 2010    "3 serious falls; hit head each time; staples 1st 2 times"  . Vascular dementia dx'd 2011  . Peripheral vascular disease   . Dry cough 03/26/11    "chronic; dx'd years ago as allergy related type of thing"  . Bruises easily   . Chronic back pain greater  than 3 months duration   . CVA (cerebrovascular accident) 03/2006; 04/2009    residual "speech problems & short term memory decreased"    Past Surgical History  Procedure Date  . Removal colon polpys 12/2000    Villous adenoma of the cecum.  . Tonsillectomy and adenoidectomy   . Appendectomy   . Cataract extraction w/ intraocular lens  implant, bilateral 1984  . Dilation and curettage of uterus 1960's    "several"    Family History  Problem Relation Age of Onset  . Heart disease Mother     Social History:  reports that she has never smoked. She has never used smokeless tobacco. She reports that she does not drink alcohol or use illicit drugs.  Allergies: No Known Allergies  Medications: I have reviewed the patient's current medications.  Results for orders placed during the hospital encounter of 03/26/11 (from the past 48 hour(s))  CBC     Status: Abnormal   Collection Time   03/26/11  9:21 PM      Component Value Range Comment   WBC 7.4  4.0 - 10.5 (K/uL)    RBC 5.02  3.87 - 5.11 (MIL/uL)    Hemoglobin 15.5 (*) 12.0 - 15.0 (g/dL)    HCT 28.4 (*) 13.2 - 46.0 (%)    MCV 91.8  78.0 -  100.0 (fL)    MCH 30.9  26.0 - 34.0 (pg)    MCHC 33.6  30.0 - 36.0 (g/dL)    RDW 40.9  81.1 - 91.4 (%)    Platelets 179  150 - 400 (K/uL)   DIFFERENTIAL     Status: Normal   Collection Time   03/26/11  9:21 PM      Component Value Range Comment   Neutrophils Relative 75  43 - 77 (%)    Neutro Abs 5.5  1.7 - 7.7 (K/uL)    Lymphocytes Relative 13  12 - 46 (%)    Lymphs Abs 0.9  0.7 - 4.0 (K/uL)    Monocytes Relative 12  3 - 12 (%)    Monocytes Absolute 0.9  0.1 - 1.0 (K/uL)    Eosinophils Relative 0  0 - 5 (%)    Eosinophils Absolute 0.0  0.0 - 0.7 (K/uL)    Basophils Relative 0  0 - 1 (%)    Basophils Absolute 0.0  0.0 - 0.1 (K/uL)   BASIC METABOLIC PANEL     Status: Abnormal   Collection Time   03/26/11  9:21 PM      Component Value Range Comment   Sodium 138  135 - 145 (mEq/L)     Potassium 4.2  3.5 - 5.1 (mEq/L)    Chloride 99  96 - 112 (mEq/L)    CO2 26  19 - 32 (mEq/L)    Glucose, Bld 107 (*) 70 - 99 (mg/dL)    BUN 18  6 - 23 (mg/dL)    Creatinine, Ser 7.82  0.50 - 1.10 (mg/dL)    Calcium 9.7  8.4 - 10.5 (mg/dL)    GFR calc non Af Amer 58 (*) >90 (mL/min)    GFR calc Af Amer 67 (*) >90 (mL/min)   POCT I-STAT TROPONIN I     Status: Abnormal   Collection Time   03/26/11  9:55 PM      Component Value Range Comment   Troponin i, poc 0.38 (*) 0.00 - 0.08 (ng/mL)    Comment NOTIFIED PHYSICIAN      Comment 3            URINALYSIS, ROUTINE W REFLEX MICROSCOPIC     Status: Abnormal   Collection Time   03/26/11 11:59 PM      Component Value Range Comment   Color, Urine YELLOW  YELLOW     APPearance CLEAR  CLEAR     Specific Gravity, Urine 1.018  1.005 - 1.030     pH 5.5  5.0 - 8.0     Glucose, UA NEGATIVE  NEGATIVE (mg/dL)    Hgb urine dipstick NEGATIVE  NEGATIVE     Bilirubin Urine NEGATIVE  NEGATIVE     Ketones, ur NEGATIVE  NEGATIVE (mg/dL)    Protein, ur 30 (*) NEGATIVE (mg/dL)    Urobilinogen, UA 0.2  0.0 - 1.0 (mg/dL)    Nitrite NEGATIVE  NEGATIVE     Leukocytes, UA NEGATIVE  NEGATIVE    URINE MICROSCOPIC-ADD ON     Status: Abnormal   Collection Time   03/26/11 11:59 PM      Component Value Range Comment   Squamous Epithelial / LPF RARE  RARE     Bacteria, UA RARE  RARE     Crystals URIC ACID CRYSTALS (*) NEGATIVE      Dg Chest 2 View  03/26/2011  *RADIOLOGY REPORT*  Clinical Data: Altered level of consciousness.  Hypertension. Congestive heart failure.  CHEST - 2 VIEW  Comparison: 01/18/2010  Findings: Cardiomegaly noted with pulmonary venous hypertension.  Tortuous thoracic aorta noted.  Retrocardiac density is nonspecific but could reflect a hiatal hernia.  Right rib deformities are noted.  Interstitial accentuation is present in the lungs.  Biapical pleuroparenchymal scarring noted.  Sensitivity of the lateral projection is reduced due to motion artifact.   IMPRESSION:  1.  Cardiomegaly with pulmonary venous hypertension and borderline interstitial edema. 2.  Chronic right rib deformities. 3.  Retrocardiac density is nonspecific, may be from a hiatal hernia. 4.  Reduced sensitivity of the lateral projection due to motion artifact.  Original Report Authenticated By: Dellia Cloud, M.D.   Ct Head Wo Contrast  03/26/2011  *RADIOLOGY REPORT*  Clinical Data: Slurred speech.  The patient is leaning to the right.  CT HEAD WITHOUT CONTRAST  Technique:  Contiguous axial images were obtained from the base of the skull through the vertex without contrast.  Comparison: 01/18/2010  Findings: A new but likely chronic right pontine lacunar infarct is shown on image 11 of series 2, measuring 6 mm in diameter.  Stable appearance of the infarct involving the left basal ganglia noted.  The thalami and right basal ganglia appear unremarkable. Vascular calcifications noted.  Stable ex vacuo ventriculomegaly is present.  Periventricular and corona radiata white matter hypodensities are most compatible with chronic ischemic microvascular white matter disease.  No intracranial hemorrhage, mass lesion, or acute infarction is identified.  Mucoperiosteal thickening in the ethmoid air cells node along with the suspected frothy fluid and mucosal thickening in the right sphenoid sinus.  IMPRESSION:  1.  An interval right pontine lacunar infarct is sharply defined and likely chronic, although was not present on the prior CT scan from 01/18/2010 2.  Stable appearance of left basal ganglia infarct. 3. Periventricular and corona radiata white matter hypodensities are most compatible with chronic ischemic microvascular white matter disease. 4.  Acute-on-chronic right sphenoid sinusitis.  Chronic ethmoid sinusitis.  Original Report Authenticated By: Dellia Cloud, M.D.    Review of Systems  Constitutional: Positive for fever.  HENT: Positive for hearing loss.   Eyes: Negative.     Respiratory: Positive for cough. Negative for shortness of breath and wheezing.        Her roommate at the nursing home had a cough recently.  Cardiovascular: Negative.   Gastrointestinal: Negative.   Genitourinary: Negative.   Musculoskeletal: Negative.   Skin: Negative.   Neurological: Positive for tremors, loss of consciousness and weakness.  Endo/Heme/Allergies: Negative.   Psychiatric/Behavioral: Positive for memory loss.   Blood pressure 100/40, pulse 76, temperature 100.1 F (37.8 C), temperature source Oral, resp. rate 16, SpO2 94.00%. Physical Exam  Constitutional: No distress.  HENT:  Head: Normocephalic.  Mouth/Throat: Oropharynx is clear and moist.  Eyes: Conjunctivae are normal. Pupils are equal, round, and reactive to light. Right eye exhibits no discharge. Left eye exhibits no discharge. No scleral icterus.  Neck: Normal range of motion. Neck supple. No JVD present. No thyromegaly present.  Cardiovascular: Normal rate and regular rhythm.  Exam reveals no gallop, no S3, no S4 and no friction rub.   Murmur heard.  Systolic murmur is present with a grade of 3/6  Respiratory: Breath sounds normal. No stridor. No respiratory distress. She has no wheezes. She has no rales. She exhibits no tenderness.  GI: Soft. Bowel sounds are normal. She exhibits no distension. There is no tenderness.  Genitourinary:  Deferred.  Musculoskeletal: Normal range of motion.  Lymphadenopathy:    She has no cervical adenopathy.  Neurological: She is alert.       Oriented partially to time. She knew her birth day and month but missed the year by 2 years.  Skin: Skin is warm and dry. No rash noted. She is not diaphoretic. No erythema. No pallor.  Psychiatric: She has a normal mood and affect. Thought content normal.    Assessment/Plan: Minimal troponin elevation in patient with chronic diastolic heart failure , transient altered mental state, associated with urinary incontinence and  seizure like activity;CT head reveals a new CVA.  ? New CVA/TIA ? Seizure  The troponin elevation is not unusual for a patient with chronic heart failure. Also a stroke or seizure may result in low level positive troponin. Further troponin trending may not be of any value given the fact that patient is not symptomatic coupled with the fact that she will most likely not be treated in any aggressive manner. We will recommend only ASA 325 mg stat and daily at this time. No invasive measures will be advised. Thank you for the consult and we will be available to answer further questions.   Grandville Silos 03/27/2011, 2:31 AM

## 2011-03-27 NOTE — Progress Notes (Signed)
.  aPHARMACIST - PHYSICIAN COMMUNICATION  CONCERNING: P&T Medication Policy Regarding Oral Bisphosphonates  RECOMMENDATION: Your order for alendronate (Fosamax) has been discontinued at this time.  If the patient's post-hospital medical condition warrants safe use of this class of drugs, please resume the pre-hospital regimen upon discharge.  DESCRIPTION:  Alendronate (Fosamax), ibandronate (Boniva), and risedronate (Actonel) can cause severe esophageal erosions in patients who are unable to remain upright at least 30 minutes after taking this medication.   Since brief interruptions in therapy are thought to have minimal impact on bone mineral density, the Pharmacy & Therapeutics Committee has established that bisphosphonate orders should be routinely discontinued during hospitalization.   To override this safety policy and permit administration of Boniva, Fosamax, or Actonel in the hospital, prescribers must write "DO NOT HOLD" in the comments section when placing the order for this class of medications.

## 2011-03-27 NOTE — Progress Notes (Signed)
Physical Therapy Evaluation Patient Details Name: Julie Frank MRN: [id number] DOB: July 08, 1909 Today's Date: 03/27/2011  Problem List:  Patient Active Problem List  Diagnoses  . CVA (cerebrovascular accident)  . SVT (supraventricular tachycardia)  . Mitral insufficiency  . Hypertension  . CHF (congestive heart failure)  . History of diastolic dysfunction  . Febrile illness, acute  . Weakness generalized  . Sinusitis acute  . Bronchitis, acute  . TIA (transient ischemic attack)  . Troponin I above reference range    Past Medical History:  Past Medical History  Diagnosis Date  . Mitral insufficiency     CHRONIC  . Hypertension   . Debility     GENERALIZED  . Hyperlipidemia   . OA (osteoarthritis)   . OP (osteoporosis)   . CHF (congestive heart failure)   . History of diastolic dysfunction   . Muscle weakness (generalized)   . Difficulty walking   . Metabolic encephalopathy   . Hypothyroidism   . HOH (hard of hearing)     "extremely"  . SVT (supraventricular tachycardia)     nonsustained  . Heart murmur   . Repeated falls 2010    "3 serious falls; hit head each time; staples 1st 2 times"  . Vascular dementia dx'd 2011  . Peripheral vascular disease   . Dry cough 03/26/11    "chronic; dx'd years ago as allergy related type of thing"  . Bruises easily   . Chronic back pain greater than 3 months duration   . CVA (cerebrovascular accident) 03/2006; 04/2009    residual "speech problems & short term memory decreased"   Past Surgical History:  Past Surgical History  Procedure Date  . Removal colon polpys 12/2000    Villous adenoma of the cecum.  . Tonsillectomy and adenoidectomy   . Appendectomy   . Cataract extraction w/ intraocular lens  implant, bilateral 1984  . Dilation and curettage of uterus 1960's    "several"    PT Assessment/Plan/Recommendation PT Assessment Clinical Impression Statement: 78 yo female admitted with possible CVA presents with decr  mobility and impairments listed below; will benefit from acute PT wo maximize functional mobility, decrease caregiver burden, prep for dc back to SNF for potential rehab; Will need more info re: PLOF, and as that becomes available, will amend PT goals and frequency as necessary PT Recommendation/Assessment: Patient will need skilled PT in the acute care venue PT Problem List: Decreased strength;Decreased range of motion;Decreased activity tolerance;Decreased balance;Decreased mobility;Decreased coordination;Decreased cognition;Decreased knowledge of use of DME;Decreased safety awareness;Impaired tone;Pain Barriers to Discharge: None PT Therapy Diagnosis : Generalized weakness;Abnormality of gait;Difficulty walking PT Plan PT Frequency: Min 3X/week PT Treatment/Interventions: DME instruction;Gait training;Functional mobility training;Therapeutic activities;Therapeutic exercise;Balance training;Neuromuscular re-education;Cognitive remediation;Patient/family education PT Recommendation Recommendations for Other Services: OT consult;Speech consult (as ordered) Follow Up Recommendations: 24 hour supervision/assistance;Skilled nursing facility Equipment Recommended: Defer to next venue PT Goals  Acute Rehab PT Goals PT Goal Formulation: Patient unable to participate in goal setting Time For Goal Achievement: 2 weeks Pt will Roll Supine to Right Side: with supervision;with rail Pt will Roll Supine to Left Side: with supervision;with rail Pt will go Supine/Side to Sit: with supervision Pt will Sit at Edge of Bed: with supervision;with unilateral upper extremity support Pt will go Sit to Supine/Side: with supervision Pt will go Sit to Stand: with min assist Pt will go Stand to Sit: with min assist (and controlled descent) Pt will Transfer Bed to Chair/Chair to Bed: with min assist Pt will Ambulate: 1 -  15 feet;with mod assist;with least restrictive assistive device  PT  Evaluation Precautions/Restrictions  Precautions Precautions: Fall Prior Functioning  Home Living Lives With: Other (Comment) (Staff at SNF) Receives Help From:  (Staff at SNF) Type of Home: Skilled Nursing Facility Home Adaptive Equipment: Walker - rolling Prior Function Level of Independence: Needs assistance with ADLs;Needs assistance with tranfers Comments: Pt is an unreliable historian; but shows good awareness of defecits Cognition Cognition Arousal/Alertness: Other (comment) (asleep, easily arousable; was incontinent of urine) Overall Cognitive Status: History of cognitive impairments History of Cognitive Impairment:  (Will need more info re: baseline) Orientation Level: Oriented to person Sensation/Coordination Sensation Light Touch: Appears Intact Coordination Gross Motor Movements are Fluid and Coordinated: No Fine Motor Movements are Fluid and Coordinated: No Coordination and Movement Description: slow moving, tactile cueing to initiate; innefficient movement Extremity Assessment RUE Assessment RUE Assessment: Exceptions to [facility] RUE AROM (degrees) RUE Overall AROM Comments: WFL RUE Strength RUE Overall Strength Comments: 3/5 grossly LUE Assessment LUE Assessment: Exceptions to WFL LUE AROM (degrees) LUE Overall AROM Comments: Defer to OT LUE Strength LUE Overall Strength Comments: Defer to OT RLE Assessment RLE Assessment: Exceptions to [facility] RLE Strength RLE Overall Strength Comments: Grossly decr AROM and strength, requiring physical assist for sit to stand; Quad at least 3/5 LLE Assessment LLE Assessment: Exceptions to [facility] LLE Strength LLE Overall Strength Comments: Grossly decr AROM and strength, requiring physical assist for sit to stand; Quad at least 3/5 Mobility (including Balance) Bed Mobility Bed Mobility: Yes Supine to Sit: 1: +1 Total assist (pt=40%) Supine to Sit Details (indicate cue type and reason): cues to initiate; tactile cueing for lower  and upper trunk rotation to right; required physical assist to clear feet off EOB; tending to keep bil LEs in slightly more extension than normal once EOB; question incr tone versus not understanding goal of task Sitting - Scoot to Edge of Bed: 3: Mod assist;With rail Sitting - Scoot to [place] of Bed Details (indicate cue type and reason): cues to initiate; required physical assist to weight shift and reciprocal scoot to EOB; was able to reciprocal scoot (though short, innefficient scooting) back towards middle of bed in prep for laying down Transfers Transfers: Yes Sit to Stand: 3: Mod assist;From bed;With upper extremity assist (UEs on RW) Sit to Stand Details (indicate cue type and reason): Required physical assist to steady RW and keep feet planted on floor secondary to noted tendency for feet to slide anteriorly (incr tone?) Stand to Sit: 3: Mod assist;To bed Stand to Sit Details: physical assist to control descent Stand Pivot Transfers: 1: +2 Total assist;Patient percentage (comment) (pt=40%) Stand Pivot Transfer Details (indicate cue type and reason): simulated at EOB; noted decr ability to organize body over base of support when a dynamic turn is introduced Ambulation/Gait Ambulation/Gait:  (Attempted, pt unable to step with +2 assist)  Posture/Postural Control Posture/Postural Control: Postural limitations Postural Limitations: Significantly kyphotic posture with stiffness noted; unable to acheive fully uproght posture Balance Balance Assessed: Yes Static Standing Balance Static Standing - Balance Support: Bilateral upper extremity supported (on RW) Static Standing - Level of Assistance: 3: Mod assist Static Standing - Comment/# of Minutes: Stood at [employer] approx 1-2 minutes for hygeine; very trunk flexed posture    End of Session PT - End of Session Activity Tolerance: Patient limited by fatigue Patient left: in bed;with call bell in reach Nurse Communication: Mobility status for  transfers General Behavior During Session: [facility] for tasks performed Cognition: [facility] for tasks  performed (for simple mobility tasks)  Van Clines [place], [place] [phone]  03/27/2011, 5:30 PM

## 2011-03-27 NOTE — Progress Notes (Signed)
*  PRELIMINARY RESULTS* Echocardiogram 2D Echocardiogram has been performed.  [name] [facility] 03/27/2011, 12:50 PM

## 2011-03-27 NOTE — Progress Notes (Signed)
Speech Language Pathology Clinical/Bedside Swallow Evaluation Patient Details  Name: Julie Frank MRN: [id number] DOB: 05-04-1909 Today's Date: 03/27/2011  Past Medical History:  Past Medical History  Diagnosis Date  . Mitral insufficiency     CHRONIC  . Hypertension   . Debility     GENERALIZED  . Hyperlipidemia   . OA (osteoarthritis)   . OP (osteoporosis)   . CHF (congestive heart failure)   . History of diastolic dysfunction   . Muscle weakness (generalized)   . Difficulty walking   . Metabolic encephalopathy   . Hypothyroidism   . HOH (hard of hearing)     "extremely"  . SVT (supraventricular tachycardia)     nonsustained  . Heart murmur   . Repeated falls 2010    "3 serious falls; hit head each time; staples 1st 2 times"  . Vascular dementia dx'd 2011  . Peripheral vascular disease   . Dry cough 03/26/11    "chronic; dx'd years ago as allergy related type of thing"  . Bruises easily   . Chronic back pain greater than 3 months duration   . CVA (cerebrovascular accident) 03/2006; 04/2009    residual "speech problems & short term memory decreased"   Past Surgical History:  Past Surgical History  Procedure Date  . Removal colon polpys 12/2000    Villous adenoma of the cecum.  . Tonsillectomy and adenoidectomy   . Appendectomy   . Cataract extraction w/ intraocular lens  implant, bilateral 1984  . Dilation and curettage of uterus 1960's    "several"   HPI: 78 yo resident of NH brought in by daughter after an episode of confusion and weakness witnessed today. Patient has history of recurrent CVA. Has had residual speech difficulty but that has gotten worse lately. Also had elevated troponin. Seen by Cardiology in ED and believed not significant. Has been having cough, low grade fever and her room mate has been sick also.  Patient has been seen by SLP services in the past for swallow assessment via MBSS on 05/03/09 and 03/21/10.  Last study recommended regular textures  and thin liquids with chin tuck and no straws.  Patient's daughter reports that the chin tuck has not been followed at SNF (no pulmonary issues) and she does avoid straws.   Assessment/Recommendations/Treatment Plan  Clinical Impression Statement: Demonstrates an overall functional oral-pharyngeal swallow without any overt coughing or other direct clinical indications of an overt aspiration.  Aware of current work-up regarding a possible new CVA.  Given patient's h/o dysphagia and possible new CVA, will f/u on 03/28/11 via diagnostic-treatment to determine if an objective swallow assessment (MBSS) is warranted or patient is safely tolerating diet.  Will not recommend a chin tuck posture at this time given changes in comprehension/cognition from this new event. Daughter telephone and agreed with plan.  Risk for Aspiration: Mild Other Related Risk Factors: History of dysphagia;Cognitive impairment;Previous CVA;Decreased respiratory status  Recommendations 1. Regular & Thin liquids 2. Whole meds with liquid 3. Cognitive-Linguistic evaluation to follow on 03/28/11 4.. Supervision: Patient able to self feed;Full supervision/cueing for compensatory strategies Compensations: Slow rate;Small sips/bites;Follow solids with liquid Postural Changes and/or Swallow Maneuvers: Seated upright 90 degrees;Upright 30-60 min after meal Oral Care Recommendations: Oral care BID;Staff/trained caregiver to provide oral care Follow up Recommendations: Skilled Nursing facility  Treatment Plan Treatment Plan Recommendations: Therapy as outlined in treatment plan below Speech Therapy Frequency: min 1 x/week Treatment Duration: 1 week Interventions: Aspiration precaution training;Diet toleration management by SLP;Patient/family education;Compensatory  techniques  Prognosis Prognosis for Safe Diet Advancement: Good  Individuals Consulted Consulted and Agree with Results and Recommendations: Patient;Family  member/caregiver;RN Family Member Consulted: Daughter (POA)  Swallowing Goals  SLP Swallowing Goals Patient will consume recommended diet without observed clinical signs of aspiration with: Supervision/safety Patient will utilize recommended strategies during swallow to increase swallowing safety with: Supervision/safety  Swallow Evaluation General  Date of Onset: 03/26/11 Type of Study: Bedside swallow evaluation Diet Prior to this Study: Regular;Thin liquids Temperature Spikes Noted: No History of Intubation: No Behavior/Cognition: Alert;Cooperative Oral Cavity - Dentition: Adequate natural dentition Vision: Functional for self-feeding Patient Positioning: Upright in bed Baseline Vocal Quality: Breathy;Low vocal intensity Volitional Cough: Weak Volitional Swallow: Able to elicit Ice chips: Not tested  Oral Motor/Sensory Function  Overall Oral Motor/Sensory Function: Appears within functional limits for tasks assessed  Consistency Results  Ice Chips Ice chips: Not tested  Thin Liquid Thin Liquid: Within functional limits Presentation: Self Fed;Cup  Nectar Thick Liquid Nectar Thick Liquid: Not tested  Honey Thick Liquid Honey Thick Liquid: Not tested  Puree Puree: Within functional limits Presentation: Spoon;Self Fed  Solid Solid: Within functional limits Presentation: Self Fed   Myra Rude, M.S.,CCC-SLP Pager [phone] 03/27/2011,1:54 PM

## 2011-03-27 NOTE — Progress Notes (Signed)
Patient's dose of lopressor held this am due to BP 103/49.  Dr. Thedore Mins paged and notified.  Will continue to monitor.  Colman Cater

## 2011-03-27 NOTE — Progress Notes (Signed)
*  PRELIMINARY RESULTS*  Carotid Doppler has been performed. Bilateral:  No evidence of hemodynamically significant internal carotid artery stenosis.   Vertebral artery flow is antegrade.      Farrel Demark RDMS 03/27/2011, 10:36 AM

## 2011-03-27 NOTE — ED Notes (Signed)
[address] unable to take pt on their floor d/t no monitor bed available noted after attempting to call report. Bed controlled notified

## 2011-03-28 ENCOUNTER — Telehealth: Payer: Self-pay | Admitting: Cardiology

## 2011-03-28 LAB — BASIC METABOLIC PANEL
CO2: 19 mEq/L (ref 19–32)
Chloride: 111 mEq/L (ref 96–112)
GFR calc Af Amer: 81 mL/min — ABNORMAL LOW (ref >90)
Potassium: 4.2 mEq/L (ref 3.5–5.1)
Sodium: 140 mEq/L (ref 135–145)

## 2011-03-28 MED ORDER — SIMVASTATIN 5 MG PO TABS
5.0000 mg | ORAL_TABLET | Freq: Every day | ORAL | Status: DC
  Administered 2011-03-28: 5 mg via ORAL
  Filled 2011-03-28 (×2): qty 1

## 2011-03-28 NOTE — Progress Notes (Signed)
Request came by Epic that patient wanted a visit from a chaplain and prayer. Went to patient room to visit with her. Patient is 78 years of age and very pleasant to visit with although she is had of hearing. Offered prayer and support for patient per her request. Left patient room as she was expecting a visit from her daughter.

## 2011-03-28 NOTE — Progress Notes (Signed)
Julie Frank WUJ:[id number]:[id number] is a 78 y.o. female,  Outpatient Primary MD for the patient is No primary provider on file.  Chief Complaint  Patient presents with  . Weakness    L sided w/slurred speach, resolved        Subjective:   Julie Frank today has, No headache, No chest pain, No abdominal pain - No Nausea, No new weakness tingling or numbness, No Cough - SOB.    Objective:   Filed Vitals:   03/28/11 0000 03/28/11 0400 03/28/11 0500 03/28/11 0802  BP: 109/66 121/66 139/69 144/80  Pulse: 74 71 70 68  Temp: 97.6 F (36.4 C) 97.8 F (36.6 C) 98.1 F (36.7 C) 97.3 F (36.3 C)  TempSrc: Oral Oral Oral Oral  Resp: 18 18 18 16   Height:      Weight:      SpO2: 92% 93% 93% 94%    Wt Readings from Last 3 Encounters:  03/27/11 49.487 kg (109 lb 1.6 oz)  03/14/11 52.164 kg (115 lb)  09/06/10 52.164 kg (115 lb)     Intake/Output Summary (Last 24 hours) at 03/28/11 1146 Last data filed at 03/27/11 1843  Gross per 24 hour  Intake 514.17 ml  Output      0 ml  Net 514.17 ml    Exam Awake Alert, Oriented *3, No new F.N deficits, Normal affect .AT,PERRAL Supple Neck,No JVD, No cervical lymphadenopathy appriciated.  Symmetrical Chest wall movement, Good air movement bilaterally, CTAB RRR,No Gallops,Rubs or new Murmurs, No Parasternal Heave +ve B.Sounds, Abd Soft, Non tender, No organomegaly appriciated, No rebound -guarding or rigidity. No Cyanosis, Clubbing or edema, No new Rash or bruise     Data Review  CBC  Lab 03/27/11 0648 03/26/11 2121  WBC 5.2 7.4  HGB 13.2 15.5*  HCT 41.1 46.1*  PLT 200 179  MCV 91.9 91.8  MCH 29.5 30.9  MCHC 32.1 33.6  RDW 14.9 14.8  LYMPHSABS -- 0.9  MONOABS -- 0.9  EOSABS -- 0.0  BASOSABS -- 0.0  BANDABS -- --    Chemistries   Lab 03/28/11 0612 03/27/11 0648 03/26/11 2121  NA 140 137 138  K 4.2 3.8 4.2  CL 111 102 99  CO2 19 24 26   GLUCOSE 90 81 107*  BUN 17 18 18   CREATININE 0.68 0.77 0.81  CALCIUM  8.4 8.6 9.7  MG -- -- --  AST -- 20 --  ALT -- 8 --  ALKPHOS -- 44 --  BILITOT -- 0.5 --   ------------------------------------------------------------------------------------------------------------------ estimated creatinine clearance is 27.5 ml/min (by C-G formula based on Cr of 0.68). ------------------------------------------------------------------------------------------------------------------  [facility] 03/27/11 0648  HGBA1C 5.6   ------------------------------------------------------------------------------------------------------------------  Basename 03/27/11 0648  CHOL 214*  HDL 41  LDLCALC 147*  TRIG 128  CHOLHDL 5.2  LDLDIRECT --   ------------------------------------------------------------------------------------------------------------------  Alvira Philips 03/27/11 0648  TSH 1.146  T4TOTAL --  T3FREE --  THYROIDAB --   ------------------------------------------------------------------------------------------------------------------ No results found for this basename: VITAMINB12:2,FOLATE:2,FERRITIN:2,TIBC:2,IRON:2,RETICCTPCT:2 in the last 72 hours  Coagulation profile No results found for this basename: INR:5,PROTIME:5 in the last 168 hours  No results found for this basename: DDIMER:2 in the last 72 hours  Cardiac Enzymes  Lab 03/27/11 2328 03/27/11 1531 03/27/11 0849  CKMB 2.9 3.2 3.0  TROPONINI <0.30 <0.30 <0.30  MYOGLOBIN -- -- --   ------------------------------------------------------------------------------------------------------------------ No components found with this basename: POCBNP:3  Micro Results Recent Results (from the past 240 hour(s))  MRSA PCR SCREENING     Status: Normal  Collection Time   03/27/11  6:44 AM      Component Value Range Status Comment   MRSA by PCR NEGATIVE  NEGATIVE  Final     Radiology Reports Dg Chest 2 View  03/26/2011  *RADIOLOGY REPORT*  Clinical Data: Altered level of consciousness.  Hypertension.  Congestive heart failure.  CHEST - 2 VIEW  Comparison: 01/18/2010  Findings: Cardiomegaly noted with pulmonary venous hypertension.  Tortuous thoracic aorta noted.  Retrocardiac density is nonspecific but could reflect a hiatal hernia.  Right rib deformities are noted.  Interstitial accentuation is present in the lungs.  Biapical pleuroparenchymal scarring noted.  Sensitivity of the lateral projection is reduced due to motion artifact.  IMPRESSION:  1.  Cardiomegaly with pulmonary venous hypertension and borderline interstitial edema. 2.  Chronic right rib deformities. 3.  Retrocardiac density is nonspecific, may be from a hiatal hernia. 4.  Reduced sensitivity of the lateral projection due to motion artifact.  Original Report Authenticated By: Dellia Cloud, M.D.   Ct Head Wo Contrast  03/26/2011  *RADIOLOGY REPORT*  Clinical Data: Slurred speech.  The patient is leaning to the right.  CT HEAD WITHOUT CONTRAST  Technique:  Contiguous axial images were obtained from the base of the skull through the vertex without contrast.  Comparison: 01/18/2010  Findings: A new but likely chronic right pontine lacunar infarct is shown on image 11 of series 2, measuring 6 mm in diameter.  Stable appearance of the infarct involving the left basal ganglia noted.  The thalami and right basal ganglia appear unremarkable. Vascular calcifications noted.  Stable ex vacuo ventriculomegaly is present.  Periventricular and corona radiata white matter hypodensities are most compatible with chronic ischemic microvascular white matter disease.  No intracranial hemorrhage, mass lesion, or acute infarction is identified.  Mucoperiosteal thickening in the ethmoid air cells node along with the suspected frothy fluid and mucosal thickening in the right sphenoid sinus.  IMPRESSION:  1.  An interval right pontine lacunar infarct is sharply defined and likely chronic, although was not present on the prior CT scan from 01/18/2010 2.  Stable  appearance of left basal ganglia infarct. 3. Periventricular and corona radiata white matter hypodensities are most compatible with chronic ischemic microvascular white matter disease. 4.  Acute-on-chronic right sphenoid sinusitis.  Chronic ethmoid sinusitis.  Original Report Authenticated By: Dellia Cloud, M.D.   Mri Brain Without Contrast  03/27/2011  *RADIOLOGY REPORT*  Clinical Data:  Progressive weakness with slurred speech.  MRI HEAD WITHOUT CONTRAST MRA HEAD WITHOUT CONTRAST  Technique: Multiplanar, multiecho pulse sequences of the brain and surrounding structures were obtained according to standard protocol without intravenous contrast.  Angiographic images of the head were obtained using MRA technique without contrast.  Comparison: 03/26/2011 head CT.  05/02/2009 of brain MR.  MRI HEAD  Findings:  No acute infarct.  Remote right paracentral pontine infarct.  Prominent small vessel disease type changes.  No intracranial hemorrhage.  No intracranial mass lesion detected on this unenhanced exam.  Atrophy.  Ventricular prominence may be related to atrophy rather than hydrocephalus.  Cervical spondylotic changes with spinal stenosis and mild cord flattening C3-4.  Transverse ligament hypertrophy.  Opacification right sphenoid sinus.  Mild mucosal thickening ethmoid sinus air cells. Minimal mastoid air cell opacification.  IMPRESSION: No acute infarct.  Remote right paracentral pontine infarct.  Prominent small vessel disease type changes.  Global atrophy.  Ventricular prominence may be related to atrophy rather hydrocephalus.  Cervical spondylotic changes with spinal stenosis C3-4.  Opacified right sphenoid sinus.  Mucocele not excluded.  Minimal mucosal thickening ethmoid sinus air cells.  MRA HEAD  Findings: Mild narrowing and irregularity of the cavernous segment of the right internal carotid artery.  Mild to moderate narrowing supraclinoid aspect of the right internal carotid artery with post  stenotic dilation.  Aplastic A1 segment of the right anterior cerebral artery.  Mild narrowing right middle cerebral artery bifurcation.  Moderate to marked focal stenosis proximal right middle cerebral artery branch vessels.  Ectatic cavernous segment left internal carotid artery.  Mild to moderate focal narrowing proximal left supraclinoid internal carotid artery.  Mild to moderate narrowing A1 segment and M1 segment of the left anterior cerebral artery and left middle cerebral artery respectively.  Moderate narrowing proximal left middle cerebral artery branch vessels.  Moderate narrowing A2 segment anterior cerebral arteries bilaterally.  Right vertebral artery is dominant in size.  Mild irregularity of the vertebral arteries bilaterally.  Nonvisualization PICAs.  Moderate narrowing distal basilar artery.  Moderate narrowing proximal AICAs bilaterally.  Poor delineation with moderate to marked narrowing superior cerebellar arteries more notable on the right.  Moderate to marked narrowing of portions of the proximal posterior cerebral artery bilaterally with moderate branch vessel irregularity bilaterally.  No aneurysm noted.  IMPRESSION: Prominent intracranial atherosclerotic type changes as noted above.  Original Report Authenticated By: Fuller Canada, M.D.   Mr Mra Head/brain Wo Cm  03/27/2011  *RADIOLOGY REPORT*  Clinical Data:  Progressive weakness with slurred speech.  MRI HEAD WITHOUT CONTRAST MRA HEAD WITHOUT CONTRAST  Technique: Multiplanar, multiecho pulse sequences of the brain and surrounding structures were obtained according to standard protocol without intravenous contrast.  Angiographic images of the head were obtained using MRA technique without contrast.  Comparison: 03/26/2011 head CT.  05/02/2009 of brain MR.  MRI HEAD  Findings:  No acute infarct.  Remote right paracentral pontine infarct.  Prominent small vessel disease type changes.  No intracranial hemorrhage.  No intracranial mass  lesion detected on this unenhanced exam.  Atrophy.  Ventricular prominence may be related to atrophy rather than hydrocephalus.  Cervical spondylotic changes with spinal stenosis and mild cord flattening C3-4.  Transverse ligament hypertrophy.  Opacification right sphenoid sinus.  Mild mucosal thickening ethmoid sinus air cells. Minimal mastoid air cell opacification.  IMPRESSION: No acute infarct.  Remote right paracentral pontine infarct.  Prominent small vessel disease type changes.  Global atrophy.  Ventricular prominence may be related to atrophy rather hydrocephalus.  Cervical spondylotic changes with spinal stenosis C3-4.  Opacified right sphenoid sinus.  Mucocele not excluded.  Minimal mucosal thickening ethmoid sinus air cells.  MRA HEAD  Findings: Mild narrowing and irregularity of the cavernous segment of the right internal carotid artery.  Mild to moderate narrowing supraclinoid aspect of the right internal carotid artery with post stenotic dilation.  Aplastic A1 segment of the right anterior cerebral artery.  Mild narrowing right middle cerebral artery bifurcation.  Moderate to marked focal stenosis proximal right middle cerebral artery branch vessels.  Ectatic cavernous segment left internal carotid artery.  Mild to moderate focal narrowing proximal left supraclinoid internal carotid artery.  Mild to moderate narrowing A1 segment and M1 segment of the left anterior cerebral artery and left middle cerebral artery respectively.  Moderate narrowing proximal left middle cerebral artery branch vessels.  Moderate narrowing A2 segment anterior cerebral arteries bilaterally.  Right vertebral artery is dominant in size.  Mild irregularity of the vertebral arteries bilaterally.  Nonvisualization PICAs.  Moderate narrowing distal  basilar artery.  Moderate narrowing proximal AICAs bilaterally.  Poor delineation with moderate to marked narrowing superior cerebellar arteries more notable on the right.  Moderate to  marked narrowing of portions of the proximal posterior cerebral artery bilaterally with moderate branch vessel irregularity bilaterally.  No aneurysm noted.  IMPRESSION: Prominent intracranial atherosclerotic type changes as noted above.  Original Report Authenticated By: Fuller Canada, M.D.   Carotid No significant extracranial carotid artery stenosis demonstrated. Vertebrals are patent with antegrade flow.  Echo  - Left ventricle: The cavity size was normal. There was severe concentric hypertrophy. Systolic function was normal. The estimated ejection fraction was in the range of 50% to 55%. There is akinesis of the basal-midinferoseptal myocardium. Doppler parameters are consistent with abnormal left ventricular relaxation (grade 1 diastolic dysfunction). Doppler parameters are consistent with high ventricular filling pressure. - Aortic valve: Trivial regurgitation. - Mitral valve: Calcified annulus. Mild regurgitation directed eccentrically and posteriorly. - Left atrium: The atrium was mildly to moderately dilated. - Pulmonary arteries: PA peak pressure: 32mm Hg (S). Impressions:  - When compared to prior study, EF appears decreased (mostly secondary to decreased basal inferior wall motion. Mitral regurgitation does not appear as significant. No cardiac source of emboli was indentified.    Scheduled Meds:   .  stroke: mapping our early stages of recovery book   Does not apply Once  . acetaminophen  650 mg Oral BID  . aspirin  325 mg Oral Daily  . enoxaparin  30 mg Subcutaneous Daily  . guaiFENesin  600 mg Oral BID  . levothyroxine  25 mcg Oral Q breakfast  . metoprolol succinate  12.5 mg Oral Daily  . moxifloxacin  400 mg Intravenous Q24H  . mulitivitamin with minerals  1 tablet Oral Daily  . pantoprazole  40 mg Oral Q1200  . polyethylene glycol  17 g Oral QODAY  . DISCONTD: metoprolol succinate  25 mg Oral Daily   Continuous Infusions:   . sodium chloride 50 mL/hr  at 03/27/11 0826   PRN Meds:.acetaminophen, acetaminophen, albuterol, hydrocortisone, ondansetron (ZOFRAN) IV, ondansetron  Assessment & Plan    1. Expressive aphasia, history of multiple CVAs - MRI-A,Echo,Carotid Duplex -ve, A1c 5.6, lipid panel as below, low dose statin & seen by Pt-Ot, Nuro. Speech to see again. Improved.   Results for KARESHA, TRZCINSKI (MRN [id number]) as of 03/28/2011 11:48  Ref. Range 03/27/2011 06:48  Cholesterol Latest Range: 0-200 mg/dL 782 (H)  Triglycerides Latest Range: <150 mg/dL 956  HDL Latest Range: >39 mg/dL 41  LDL (calc) Latest Range: 0-99 mg/dL 213 (H)  VLDL Latest Range: 0-40 mg/dL 26  Total CHOL/HDL Ratio No range found 5.2    2. Chr CHF Diastolic-compensated.  3. Mild dyslipidemia- low dose statin.  4. Wall Motion Abnormality on Echo - not a surgical candidate and pain-symptom free, Trop in lab-ve x 3, istat borderline? Accuracy, on Med Rx- ASA-B blocker-Statin.  Lab Results  Component Value Date   CKTOTAL 81 03/27/2011   CKMB 2.9 03/27/2011   TROPONINI <0.30 03/27/2011    5.? URI upon admission - afebrile, CXR stable, monitor.   DVT Prophylaxis  Lovenox   See all Orders from today for further details  Daughter updated over phone for 20 mins  On 03-28-11   Leroy Sea M.D on 03/28/2011 at 11:46 AM  [facility] Office  [phone]

## 2011-03-28 NOTE — Progress Notes (Signed)
Request came by Epic that patient wanted a visit from a chaplain and prayer. Went to patient room to visit with her. Patient is 78 years of age and very pleasant to visit with although she is had of hearing. Offered prayer and support for patient per her request. Left patient room as she was expecting a visit from her daughter. °

## 2011-03-28 NOTE — Telephone Encounter (Signed)
Patient's daughter, called wanting Dr.Jordan to know patient admitted to [place] 03/18/11,with a CVA.States mother is improving slowly,having test done.Dr.Jordan was told.

## 2011-03-28 NOTE — Progress Notes (Signed)
Chaplain's Note:  Per pt request, will refer to [facility] adjunct chaplain for follow-up.

## 2011-03-28 NOTE — Telephone Encounter (Signed)
Pt daughter wants to give you an update on pt because she is in the hospital she only be home until about noon so if you can please call before then she wont take much of your time

## 2011-03-28 NOTE — Progress Notes (Signed)
Clinical social worker aware of potential disposition needs, and will assess tomorrrow.   Catha Gosselin, LCSWA  [phone] .03/28/2011 17:12pm

## 2011-03-28 NOTE — Progress Notes (Signed)
Physical Therapy Treatment Patient Details Name: Julie Frank MRN: [id number] DOB: 07-30-1909 Today's Date: 03/28/2011  PT Assessment/Plan  PT - Assessment/Plan Comments on Treatment Session: Pt motivated but limited by weakness and fatique. Pt able to use BSC. A required for pericare.  PT Plan: Discharge plan remains appropriate PT Frequency: Min 3X/week Follow Up Recommendations: 24 hour supervision/assistance;Skilled nursing facility Equipment Recommended: Defer to next venue PT Goals  Acute Rehab PT Goals PT Goal: Supine/Side to Sit - Progress: Progressing toward goal PT Goal: Sit to Stand - Progress: Progressing toward goal PT Goal: Stand to Sit - Progress: Progressing toward goal PT Transfer Goal: Bed to Chair/Chair to Bed - Progress: Progressing toward goal  PT Treatment Precautions/Restrictions  Precautions Precautions: Fall Mobility (including Balance) Bed Mobility Supine to Sit: 2: Max assist;With rails Supine to Sit Details (indicate cue type and reason): Cues and A to initiate sitting upright. Pt with heavy posterior lean. B LE kicking out to extension throughout.  Sitting - Scoot to Edge of Bed: 3: Mod assist Sitting - Scoot to Edge of Bed Details (indicate cue type and reason): A for efficiency of scooting with use of chuck pad. Cues for technique.  Transfers Sit to Stand: 1: +2 Total assist;From elevated surface;From bed;From chair/3-in-1;Patient percentage (comment) (40%) Stand to Sit: To chair/3-in-1;1: +2 Total assist;Patient percentage (comment) (40%) Stand to Sit Details: A to initiate stand and to ensure balance. Pt LEs extending throughout and pt going into heavy poteriorly lean Stand Pivot Transfers: 1: +2 Total assist;Patient percentage (comment) (30%) Stand Pivot Transfer Details (indicate cue type and reason): A for all aspects of transfer. Pt unable to really A with positoning.    Exercise    End of Session PT - End of Session Equipment Utilized  During Treatment: Gait belt Activity Tolerance: Patient limited by fatigue Patient left: in chair;with call bell in reach Nurse Communication: Mobility status for transfers General Behavior During Session: [facility] for tasks performed Cognition: [facility] for tasks performed  Eyden Dobie, Adline Potter 03/28/2011, 2:55 PM 03/28/2011 Fredrich Birks PTA [phone] pager [phone] office

## 2011-03-28 NOTE — Consult Note (Signed)
Reason for Consult: "confusion, weakness, speech problems in setting of cough and low grade fever"  HPI: Julie Frank is an 78 y.o. female. Who had confusion with generalized weakness and some speech difficulty in a setting of cough and low grade fever (viral syndrome). Currently does not complain of any symptoms. Neurology called due to presence of remote right paracentral pontine infarct on MRI brain.   Past Medical History  Diagnosis Date  . Mitral insufficiency     CHRONIC  . Hypertension   . Debility     GENERALIZED  . Hyperlipidemia   . OA (osteoarthritis)   . OP (osteoporosis)   . CHF (congestive heart failure)   . History of diastolic dysfunction   . Muscle weakness (generalized)   . Difficulty walking   . Metabolic encephalopathy   . Hypothyroidism   . HOH (hard of hearing)     "extremely"  . SVT (supraventricular tachycardia)     nonsustained  . Heart murmur   . Repeated falls 2010    "3 serious falls; hit head each time; staples 1st 2 times"  . Vascular dementia dx'd 2011  . Peripheral vascular disease   . Dry cough 03/26/11    "chronic; dx'd years ago as allergy related type of thing"  . Bruises easily   . Chronic back pain greater than 3 months duration   . CVA (cerebrovascular accident) 03/2006; 04/2009    residual "speech problems & short term memory decreased"   Medications: I have reviewed the patient's current medications.  Past Surgical History  Procedure Date  . Removal colon polpys 12/2000    Villous adenoma of the cecum.  . Tonsillectomy and adenoidectomy   . Appendectomy   . Cataract extraction w/ intraocular lens  implant, bilateral 1984  . Dilation and curettage of uterus 1960's    "several"   Family History  Problem Relation Age of Onset  . Heart disease Mother    Social History:  reports that she has never smoked. She has never used smokeless tobacco. She reports that she does not drink alcohol or use illicit drugs.  Allergies: No  Known Allergies  ROS: as above  Blood pressure 144/80, pulse 68, temperature 97.3 F (36.3 C), temperature source Oral, resp. rate 16, height 5' (1.524 m), weight 49.487 kg (109 lb 1.6 oz), SpO2 94.00%.  Neurological exam: AAO*3. No aphasia.  Followed simple commands. Some difficulty with complex commands. Cranial nerves: EOMI, PERRL. Visual fields were full. Sensation to V1 through V3 areas of the face was intact and symmetric throughout. There was no facial asymmetry. Shoulder shrug was 5/5 and symmetric bilaterally. Head rotation was 5/5 bilaterally. There was no dysarthria or palatal deviation. Motor: strength was 5/5 and symmetric throughout. Sensory: was intact throughout to light touch, pinprick. Coordination: finger-to-nose were intact and symmetric bilaterally. Reflexes: were 1+ in upper extremities (Biceps and Triceps , and 0 BR b/l and 1+ at the knees and 0 at the ankles. Plantar response was downgoing bilaterally. Gait: deferred  Results for orders placed during the hospital encounter of 03/26/11 (from the past 48 hour(s))  CBC     Status: Abnormal   Collection Time   03/26/11  9:21 PM      Component Value Range Comment   WBC 7.4  4.0 - 10.5 (K/uL)    RBC 5.02  3.87 - 5.11 (MIL/uL)    Hemoglobin 15.5 (*) 12.0 - 15.0 (g/dL)    HCT 16.1 (*) 09.6 - 46.0 (%)  MCV 91.8  78.0 - 100.0 (fL)    MCH 30.9  26.0 - 34.0 (pg)    MCHC 33.6  30.0 - 36.0 (g/dL)    RDW 16.1  09.6 - 04.5 (%)    Platelets 179  150 - 400 (K/uL)   DIFFERENTIAL     Status: Normal   Collection Time   03/26/11  9:21 PM      Component Value Range Comment   Neutrophils Relative 75  43 - 77 (%)    Neutro Abs 5.5  1.7 - 7.7 (K/uL)    Lymphocytes Relative 13  12 - 46 (%)    Lymphs Abs 0.9  0.7 - 4.0 (K/uL)    Monocytes Relative 12  3 - 12 (%)    Monocytes Absolute 0.9  0.1 - 1.0 (K/uL)    Eosinophils Relative 0  0 - 5 (%)    Eosinophils Absolute 0.0  0.0 - 0.7 (K/uL)    Basophils Relative 0  0 - 1 (%)    Basophils  Absolute 0.0  0.0 - 0.1 (K/uL)   BASIC METABOLIC PANEL     Status: Abnormal   Collection Time   03/26/11  9:21 PM      Component Value Range Comment   Sodium 138  135 - 145 (mEq/L)    Potassium 4.2  3.5 - 5.1 (mEq/L)    Chloride 99  96 - 112 (mEq/L)    CO2 26  19 - 32 (mEq/L)    Glucose, Bld 107 (*) 70 - 99 (mg/dL)    BUN 18  6 - 23 (mg/dL)    Creatinine, Ser 4.09  0.50 - 1.10 (mg/dL)    Calcium 9.7  8.4 - 10.5 (mg/dL)    GFR calc non Af Amer 58 (*) >90 (mL/min)    GFR calc Af Amer 67 (*) >90 (mL/min)   POCT I-STAT TROPONIN I     Status: Abnormal   Collection Time   03/26/11  9:55 PM      Component Value Range Comment   Troponin i, poc 0.38 (*) 0.00 - 0.08 (ng/mL)    Comment NOTIFIED PHYSICIAN      Comment 3            URINALYSIS, ROUTINE W REFLEX MICROSCOPIC     Status: Abnormal   Collection Time   03/26/11 11:59 PM      Component Value Range Comment   Color, Urine YELLOW  YELLOW     APPearance CLEAR  CLEAR     Specific Gravity, Urine 1.018  1.005 - 1.030     pH 5.5  5.0 - 8.0     Glucose, UA NEGATIVE  NEGATIVE (mg/dL)    Hgb urine dipstick NEGATIVE  NEGATIVE     Bilirubin Urine NEGATIVE  NEGATIVE     Ketones, ur NEGATIVE  NEGATIVE (mg/dL)    Protein, ur 30 (*) NEGATIVE (mg/dL)    Urobilinogen, UA 0.2  0.0 - 1.0 (mg/dL)    Nitrite NEGATIVE  NEGATIVE     Leukocytes, UA NEGATIVE  NEGATIVE    URINE MICROSCOPIC-ADD ON     Status: Abnormal   Collection Time   03/26/11 11:59 PM      Component Value Range Comment   Squamous Epithelial / LPF RARE  RARE     Bacteria, UA RARE  RARE     Crystals URIC ACID CRYSTALS (*) NEGATIVE    URINE RAPID DRUG SCREEN (HOSP PERFORMED)     Status: Normal  Collection Time   03/26/11 11:59 PM      Component Value Range Comment   Opiates NONE DETECTED  NONE DETECTED     Cocaine NONE DETECTED  NONE DETECTED     Benzodiazepines NONE DETECTED  NONE DETECTED     Amphetamines NONE DETECTED  NONE DETECTED     Tetrahydrocannabinol NONE DETECTED  NONE  DETECTED     Barbiturates NONE DETECTED  NONE DETECTED    MRSA PCR SCREENING     Status: Normal   Collection Time   03/27/11  6:44 AM      Component Value Range Comment   MRSA by PCR NEGATIVE  NEGATIVE    HEMOGLOBIN A1C     Status: Normal   Collection Time   03/27/11  6:48 AM      Component Value Range Comment   Hemoglobin A1C 5.6  <5.7 (%)    Mean Plasma Glucose 114  <117 (mg/dL)   CBC     Status: Normal   Collection Time   03/27/11  6:48 AM      Component Value Range Comment   WBC 5.2  4.0 - 10.5 (K/uL)    RBC 4.47  3.87 - 5.11 (MIL/uL)    Hemoglobin 13.2  12.0 - 15.0 (g/dL)    HCT 11.9  14.7 - 82.9 (%)    MCV 91.9  78.0 - 100.0 (fL)    MCH 29.5  26.0 - 34.0 (pg)    MCHC 32.1  30.0 - 36.0 (g/dL)    RDW 56.2  13.0 - 86.5 (%)    Platelets 200  150 - 400 (K/uL)   TSH     Status: Normal   Collection Time   03/27/11  6:48 AM      Component Value Range Comment   TSH 1.146  0.350 - 4.500 (uIU/mL)   COMPREHENSIVE METABOLIC PANEL     Status: Abnormal   Collection Time   03/27/11  6:48 AM      Component Value Range Comment   Sodium 137  135 - 145 (mEq/L)    Potassium 3.8  3.5 - 5.1 (mEq/L)    Chloride 102  96 - 112 (mEq/L)    CO2 24  19 - 32 (mEq/L)    Glucose, Bld 81  70 - 99 (mg/dL)    BUN 18  6 - 23 (mg/dL)    Creatinine, Ser 7.84  0.50 - 1.10 (mg/dL)    Calcium 8.6  8.4 - 10.5 (mg/dL)    Total Protein 6.4  6.0 - 8.3 (g/dL)    Albumin 3.0 (*) 3.5 - 5.2 (g/dL)    AST 20  0 - 37 (U/L)    ALT 8  0 - 35 (U/L)    Alkaline Phosphatase 44  39 - 117 (U/L)    Total Bilirubin 0.5  0.3 - 1.2 (mg/dL)    GFR calc non Af Amer 67 (*) >90 (mL/min)    GFR calc Af Amer 78 (*) >90 (mL/min)   LIPID PANEL     Status: Abnormal   Collection Time   03/27/11  6:48 AM      Component Value Range Comment   Cholesterol 214 (*) 0 - 200 (mg/dL)    Triglycerides 696  <150 (mg/dL)    HDL 41  >29 (mg/dL)    Total CHOL/HDL Ratio 5.2      VLDL 26  0 - 40 (mg/dL)    LDL Cholesterol 528 (*) 0 - 99 (mg/dL)  GLUCOSE, CAPILLARY     Status: Normal   Collection Time   03/27/11  8:28 AM      Component Value Range Comment   Glucose-Capillary 75  70 - 99 (mg/dL)   CARDIAC PANEL(CRET KIN+CKTOT+MB+TROPI)     Status: Normal   Collection Time   03/27/11  8:49 AM      Component Value Range Comment   Total CK 84  7 - 177 (U/L)    CK, MB 3.0  0.3 - 4.0 (ng/mL)    Troponin I <0.30  <0.30 (ng/mL)    Relative Index RELATIVE INDEX IS INVALID  0.0 - 2.5    GLUCOSE, CAPILLARY     Status: Normal   Collection Time   03/27/11 12:13 PM      Component Value Range Comment   Glucose-Capillary 84  70 - 99 (mg/dL)   CARDIAC PANEL(CRET KIN+CKTOT+MB+TROPI)     Status: Normal   Collection Time   03/27/11  3:31 PM      Component Value Range Comment   Total CK 82  7 - 177 (U/L)    CK, MB 3.2  0.3 - 4.0 (ng/mL)    Troponin I <0.30  <0.30 (ng/mL)    Relative Index RELATIVE INDEX IS INVALID  0.0 - 2.5    GLUCOSE, CAPILLARY     Status: Normal   Collection Time   03/27/11  5:11 PM      Component Value Range Comment   Glucose-Capillary 80  70 - 99 (mg/dL)   GLUCOSE, CAPILLARY     Status: Abnormal   Collection Time   03/27/11 10:49 PM      Component Value Range Comment   Glucose-Capillary 102 (*) 70 - 99 (mg/dL)   CARDIAC PANEL(CRET KIN+CKTOT+MB+TROPI)     Status: Normal   Collection Time   03/27/11 11:28 PM      Component Value Range Comment   Total CK 81  7 - 177 (U/L)    CK, MB 2.9  0.3 - 4.0 (ng/mL)    Troponin I <0.30  <0.30 (ng/mL)    Relative Index RELATIVE INDEX IS INVALID  0.0 - 2.5    BASIC METABOLIC PANEL     Status: Abnormal   Collection Time   03/28/11  6:12 AM      Component Value Range Comment   Sodium 140  135 - 145 (mEq/L)    Potassium 4.2  3.5 - 5.1 (mEq/L)    Chloride 111  96 - 112 (mEq/L)    CO2 19  19 - 32 (mEq/L)    Glucose, Bld 90  70 - 99 (mg/dL)    BUN 17  6 - 23 (mg/dL)    Creatinine, Ser 1.61  0.50 - 1.10 (mg/dL)    Calcium 8.4  8.4 - 10.5 (mg/dL)    GFR calc non Af Amer 70 (*) >90 (mL/min)     GFR calc Af Amer 81 (*) >90 (mL/min)     Dg Chest 2 View  03/26/2011  *RADIOLOGY REPORT*  Clinical Data: Altered level of consciousness.  Hypertension. Congestive heart failure.  CHEST - 2 VIEW  Comparison: 01/18/2010  Findings: Cardiomegaly noted with pulmonary venous hypertension.  Tortuous thoracic aorta noted.  Retrocardiac density is nonspecific but could reflect a hiatal hernia.  Right rib deformities are noted.  Interstitial accentuation is present in the lungs.  Biapical pleuroparenchymal scarring noted.  Sensitivity of the lateral projection is reduced due to motion artifact.  IMPRESSION:  1.  Cardiomegaly with pulmonary  venous hypertension and borderline interstitial edema. 2.  Chronic right rib deformities. 3.  Retrocardiac density is nonspecific, may be from a hiatal hernia. 4.  Reduced sensitivity of the lateral projection due to motion artifact.  Original Report Authenticated By: Dellia Cloud, M.D.   Ct Head Wo Contrast  03/26/2011  *RADIOLOGY REPORT*  Clinical Data: Slurred speech.  The patient is leaning to the right.  CT HEAD WITHOUT CONTRAST  Technique:  Contiguous axial images were obtained from the base of the skull through the vertex without contrast.  Comparison: 01/18/2010  Findings: A new but likely chronic right pontine lacunar infarct is shown on image 11 of series 2, measuring 6 mm in diameter.  Stable appearance of the infarct involving the left basal ganglia noted.  The thalami and right basal ganglia appear unremarkable. Vascular calcifications noted.  Stable ex vacuo ventriculomegaly is present.  Periventricular and corona radiata white matter hypodensities are most compatible with chronic ischemic microvascular white matter disease.  No intracranial hemorrhage, mass lesion, or acute infarction is identified.  Mucoperiosteal thickening in the ethmoid air cells node along with the suspected frothy fluid and mucosal thickening in the right sphenoid sinus.  IMPRESSION:  1.   An interval right pontine lacunar infarct is sharply defined and likely chronic, although was not present on the prior CT scan from 01/18/2010 2.  Stable appearance of left basal ganglia infarct. 3. Periventricular and corona radiata white matter hypodensities are most compatible with chronic ischemic microvascular white matter disease. 4.  Acute-on-chronic right sphenoid sinusitis.  Chronic ethmoid sinusitis.  Original Report Authenticated By: Dellia Cloud, M.D.   Mri Brain Without Contrast  03/27/2011  *RADIOLOGY REPORT*  Clinical Data:  Progressive weakness with slurred speech.  MRI HEAD WITHOUT CONTRAST MRA HEAD WITHOUT CONTRAST  Technique: Multiplanar, multiecho pulse sequences of the brain and surrounding structures were obtained according to standard protocol without intravenous contrast.  Angiographic images of the head were obtained using MRA technique without contrast.  Comparison: 03/26/2011 head CT.  05/02/2009 of brain MR.  MRI HEAD  Findings:  No acute infarct.  Remote right paracentral pontine infarct.  Prominent small vessel disease type changes.  No intracranial hemorrhage.  No intracranial mass lesion detected on this unenhanced exam.  Atrophy.  Ventricular prominence may be related to atrophy rather than hydrocephalus.  Cervical spondylotic changes with spinal stenosis and mild cord flattening C3-4.  Transverse ligament hypertrophy.  Opacification right sphenoid sinus.  Mild mucosal thickening ethmoid sinus air cells. Minimal mastoid air cell opacification.  IMPRESSION: No acute infarct.  Remote right paracentral pontine infarct.  Prominent small vessel disease type changes.  Global atrophy.  Ventricular prominence may be related to atrophy rather hydrocephalus.  Cervical spondylotic changes with spinal stenosis C3-4.  Opacified right sphenoid sinus.  Mucocele not excluded.  Minimal mucosal thickening ethmoid sinus air cells.  MRA HEAD  Findings: Mild narrowing and irregularity of the  cavernous segment of the right internal carotid artery.  Mild to moderate narrowing supraclinoid aspect of the right internal carotid artery with post stenotic dilation.  Aplastic A1 segment of the right anterior cerebral artery.  Mild narrowing right middle cerebral artery bifurcation.  Moderate to marked focal stenosis proximal right middle cerebral artery branch vessels.  Ectatic cavernous segment left internal carotid artery.  Mild to moderate focal narrowing proximal left supraclinoid internal carotid artery.  Mild to moderate narrowing A1 segment and M1 segment of the left anterior cerebral artery and left middle cerebral artery respectively.  Moderate narrowing proximal left middle cerebral artery branch vessels.  Moderate narrowing A2 segment anterior cerebral arteries bilaterally.  Right vertebral artery is dominant in size.  Mild irregularity of the vertebral arteries bilaterally.  Nonvisualization PICAs.  Moderate narrowing distal basilar artery.  Moderate narrowing proximal AICAs bilaterally.  Poor delineation with moderate to marked narrowing superior cerebellar arteries more notable on the right.  Moderate to marked narrowing of portions of the proximal posterior cerebral artery bilaterally with moderate branch vessel irregularity bilaterally.  No aneurysm noted.  IMPRESSION: Prominent intracranial atherosclerotic type changes as noted above.  Original Report Authenticated By: Fuller Canada, M.D.   Mr Mra Head/brain Wo Cm  03/27/2011  *RADIOLOGY REPORT*  Clinical Data:  Progressive weakness with slurred speech.  MRI HEAD WITHOUT CONTRAST MRA HEAD WITHOUT CONTRAST  Technique: Multiplanar, multiecho pulse sequences of the brain and surrounding structures were obtained according to standard protocol without intravenous contrast.  Angiographic images of the head were obtained using MRA technique without contrast.  Comparison: 03/26/2011 head CT.  05/02/2009 of brain MR.  MRI HEAD  Findings:  No acute  infarct.  Remote right paracentral pontine infarct.  Prominent small vessel disease type changes.  No intracranial hemorrhage.  No intracranial mass lesion detected on this unenhanced exam.  Atrophy.  Ventricular prominence may be related to atrophy rather than hydrocephalus.  Cervical spondylotic changes with spinal stenosis and mild cord flattening C3-4.  Transverse ligament hypertrophy.  Opacification right sphenoid sinus.  Mild mucosal thickening ethmoid sinus air cells. Minimal mastoid air cell opacification.  IMPRESSION: No acute infarct.  Remote right paracentral pontine infarct.  Prominent small vessel disease type changes.  Global atrophy.  Ventricular prominence may be related to atrophy rather hydrocephalus.  Cervical spondylotic changes with spinal stenosis C3-4.  Opacified right sphenoid sinus.  Mucocele not excluded.  Minimal mucosal thickening ethmoid sinus air cells.  MRA HEAD  Findings: Mild narrowing and irregularity of the cavernous segment of the right internal carotid artery.  Mild to moderate narrowing supraclinoid aspect of the right internal carotid artery with post stenotic dilation.  Aplastic A1 segment of the right anterior cerebral artery.  Mild narrowing right middle cerebral artery bifurcation.  Moderate to marked focal stenosis proximal right middle cerebral artery branch vessels.  Ectatic cavernous segment left internal carotid artery.  Mild to moderate focal narrowing proximal left supraclinoid internal carotid artery.  Mild to moderate narrowing A1 segment and M1 segment of the left anterior cerebral artery and left middle cerebral artery respectively.  Moderate narrowing proximal left middle cerebral artery branch vessels.  Moderate narrowing A2 segment anterior cerebral arteries bilaterally.  Right vertebral artery is dominant in size.  Mild irregularity of the vertebral arteries bilaterally.  Nonvisualization PICAs.  Moderate narrowing distal basilar artery.  Moderate narrowing  proximal AICAs bilaterally.  Poor delineation with moderate to marked narrowing superior cerebellar arteries more notable on the right.  Moderate to marked narrowing of portions of the proximal posterior cerebral artery bilaterally with moderate branch vessel irregularity bilaterally.  No aneurysm noted.  IMPRESSION: Prominent intracranial atherosclerotic type changes as noted above.  Original Report Authenticated By: Fuller Canada, M.D.   Assessment/Plan: 78 years old woman with transient confusion in setting of viral syndrome - remote paramedian R CVA on MRI 1) I don't think this old area of ischemia is related to this last event 2) CD and Echo look fine, she is on telemetry, but if she had A-fib, would she be on coumadin? ASA 325 daily is  fine 3) LDL is elevated, so she should be on statin 4) PT/OT 5) Call with questions  Roxine Whittinghill 03/28/2011, 11:20 AM

## 2011-03-28 NOTE — Progress Notes (Signed)
Speech Pathology: Dysphagia Treatment Note  Patient was observed with : Soft and Thin liquids  Patient was noted to have s/s of aspiration : No  Lung Sounds:  clear Temperature: afebrile  Clinical Impression: Demonstrates a continued functional oral-pharyngeal swallow with no overt clinical indicators of a dysphagia or aspiration.  Extensive telephone conversation with patient's daughter regarding swallow function as well as no major indicators of a new aphasia.  Education completed with patient and her daughter.  Recommendations:  1. Continue current diet 2. Will cancel language assessment as patient is at baseline function, which includes transient word finding deficits (waxes and wanes per daughter). 3. D/C skilled SLP services  Pain:   none Intervention Required:   No  Goals: All Goals Met  Myra Rude, M.S.,CCC-SLP Pager [phone]

## 2011-03-29 MED ORDER — SIMVASTATIN 5 MG PO TABS: 5.0000 mg | ORAL_TABLET | Freq: Every day | ORAL | Status: DC

## 2011-03-29 MED ORDER — ASPIRIN 325 MG PO TABS: 325.0000 mg | ORAL_TABLET | Freq: Every day | ORAL | Status: DC

## 2011-03-29 MED ORDER — MOXIFLOXACIN HCL 400 MG PO TABS: 400.0000 mg | ORAL_TABLET | Freq: Every day | ORAL | Status: AC

## 2011-03-29 NOTE — Progress Notes (Signed)
Reviewed discharge instructions with patient and daughter, they stated their understanding.  Daughter questioned why patient could not continue prn use of hydrocortisone suppository. Dr. Thedore Mins paged and notified.  MD to call over to [facility] to clarify medication could be used prn.  Discussed with daughter s/s of stroke and when to call 911.  Daughter also has stroke booklet for review.  Report called to Melody at [facility] earlier this afternoon.  Patient discharged via EMS.  Colman Cater

## 2011-03-29 NOTE — Progress Notes (Addendum)
.  Clinical social worker completed patient psychosocial assessment, please see assessment in patient shadow chart.  Pt is a long term resident at [facility] skilled nursing facility and plans to return. Pt daughter would like to be present at time of discharge. .Clinical social worker assisted with patient discharge to skilled nursing facility. .Patient transportation provided by [facility] with patient chart copy. .No further Clinical Social Work needs, signing off.    Catha Gosselin, Theresia Majors  [phone] .03/29/2011 14:36

## 2011-03-29 NOTE — Discharge Summary (Addendum)
Julie Frank, 78 y.o., DOB 1910-02-03, MRN [id number]. Admission date: 03/26/2011 Discharge Date 03/29/2011 Primary MD No primary provider on file. Admitting Physician No admitting provider for patient encounter.  Admission Diagnosis  TIA (transient ischemic attack) [435.9] Viral URI [465.9] Non-STEMI (non-ST elevated myocardial infarction) [410.70] Upper respiratory tract infection  Discharge Diagnosis   Principal Problem:  *Troponin I above reference range Active Problems:  CVA (cerebrovascular accident)  Hypertension  CHF (congestive heart failure)  History of diastolic dysfunction  Febrile illness, acute  Weakness generalized  Sinusitis acute  Bronchitis, acute      Past Medical History  Diagnosis Date  . Mitral insufficiency     CHRONIC  . Hypertension   . Debility     GENERALIZED  . Hyperlipidemia   . OA (osteoarthritis)   . OP (osteoporosis)   . CHF (congestive heart failure)   . History of diastolic dysfunction   . Muscle weakness (generalized)   . Difficulty walking   . Metabolic encephalopathy   . Hypothyroidism   . HOH (hard of hearing)     "extremely"  . SVT (supraventricular tachycardia)     nonsustained  . Heart murmur   . Repeated falls 2010    "3 serious falls; hit head each time; staples 1st 2 times"  . Vascular dementia dx'd 2011  . Peripheral vascular disease   . Dry cough 03/26/11    "chronic; dx'd years ago as allergy related type of thing"  . Bruises easily   . Chronic back pain greater than 3 months duration   . CVA (cerebrovascular accident) 03/2006; 04/2009    residual "speech problems & short term memory decreased"    Past Surgical History  Procedure Date  . Removal colon polpys 12/2000    Villous adenoma of the cecum.  . Tonsillectomy and adenoidectomy   . Appendectomy   . Cataract extraction w/ intraocular lens  implant, bilateral 1984  . Dilation and curettage of uterus 1960's    "several"     Hospital Course See H&P, Labs,  Consult and Test reports for all details in brief, patient was admitted for     1. Expressive aphasia, history of multiple CVAs - MRI-A,Echo,Carotid Duplex -ve, A1c 5.6, lipid panel as below, low dose statin & seen by Pt-Ot, Nuro. Speech to see again. Improved. Etiology unclear could be mild delirium due to URI. Seen by neuro and cleared from neuro standpoint.  Results for Julie, Frank (MRN [id number]) as of 03/28/2011 11:48   Ref. Range  03/27/2011 06:48   Cholesterol  Latest Range: 0-200 mg/dL  782 (H)   Triglycerides  Latest Range: <150 mg/dL  956   HDL  Latest Range: >39 mg/dL  41   LDL (calc)  Latest Range: 2-13 mg/dL  086 (H)   VLDL  Latest Range: 0-40 mg/dL  26   Total CHOL/HDL Ratio  No range found  5.2     2. Chr CHF Diastolic-compensated.    3. Mild dyslipidemia- low dose statin.    4. Wall Motion Abnormality on Echo - not a surgical candidate and pain-symptom free, Trop in lab-ve x 3, istat borderline? Accuracy, on Med Rx- ASA-B blocker-Statin. Pain free, consider outpt cardio followup.  Results for Julie, Frank (MRN [id number]) as of 03/29/2011 15:35  Ref. Range 03/27/2011 08:49    03/27/2011 15:31    03/27/2011 23:28  CK, MB Latest Range: 0.3-4.0 ng/mL 3.0    3.2    2.9  CK Total Latest Range: 7-177  U/L 84    82    81  Troponin I Latest Range: <0.30 ng/mL <0.30    <0.30    <0.30    5.? URI upon admission - afebrile, CXR stable, responded clinically to Avelox finish 1 week more.    Addendum - called [facility] and confirmed if patient was getting hydrocortisone Rectal Suppositories- was told she never got those there, informed the RN to check with MD if they are needed in the future.   Consults  Neuro  Significant Tests:  See full reports for all details     Carotid  No significant extracranial carotid artery stenosis demonstrated. Vertebrals are patent with antegrade flow.  Echo  - Left ventricle: The cavity size was normal. There was severe concentric  hypertrophy. Systolic function was normal. The estimated ejection fraction was in the range of 50% to 55%. There is akinesis of the basal-midinferoseptal myocardium. Doppler parameters are consistent with abnormal left ventricular relaxation (grade 1 diastolic dysfunction). Doppler parameters are consistent with high ventricular filling pressure. - Aortic valve: Trivial regurgitation. - Mitral valve: Calcified annulus. Mild regurgitation directed eccentrically and posteriorly. - Left atrium: The atrium was mildly to moderately dilated. - Pulmonary arteries: PA peak pressure: 32mm Hg (S). Impressions:  - When compared to prior study, EF appears decreased (mostly secondary to decreased basal inferior wall motion. Mitral regurgitation does not appear as significant. No cardiac source of emboli was indentified.    Dg Chest 2 View  03/26/2011  *RADIOLOGY REPORT*  Clinical Data: Altered level of consciousness.  Hypertension. Congestive heart failure.  CHEST - 2 VIEW  Comparison: 01/18/2010  Findings: Cardiomegaly noted with pulmonary venous hypertension.  Tortuous thoracic aorta noted.  Retrocardiac density is nonspecific but could reflect a hiatal hernia.  Right rib deformities are noted.  Interstitial accentuation is present in the lungs.  Biapical pleuroparenchymal scarring noted.  Sensitivity of the lateral projection is reduced due to motion artifact.  IMPRESSION:  1.  Cardiomegaly with pulmonary venous hypertension and borderline interstitial edema. 2.  Chronic right rib deformities. 3.  Retrocardiac density is nonspecific, may be from a hiatal hernia. 4.  Reduced sensitivity of the lateral projection due to motion artifact.  Original Report Authenticated By: Dellia Cloud, M.D.   Ct Head Wo Contrast  03/26/2011  *RADIOLOGY REPORT*  Clinical Data: Slurred speech.  The patient is leaning to the right.  CT HEAD WITHOUT CONTRAST  Technique:  Contiguous axial images were obtained from the base of the  skull through the vertex without contrast.  Comparison: 01/18/2010  Findings: A new but likely chronic right pontine lacunar infarct is shown on image 11 of series 2, measuring 6 mm in diameter.  Stable appearance of the infarct involving the left basal ganglia noted.  The thalami and right basal ganglia appear unremarkable. Vascular calcifications noted.  Stable ex vacuo ventriculomegaly is present.  Periventricular and corona radiata white matter hypodensities are most compatible with chronic ischemic microvascular white matter disease.  No intracranial hemorrhage, mass lesion, or acute infarction is identified.  Mucoperiosteal thickening in the ethmoid air cells node along with the suspected frothy fluid and mucosal thickening in the right sphenoid sinus.  IMPRESSION:  1.  An interval right pontine lacunar infarct is sharply defined and likely chronic, although was not present on the prior CT scan from 01/18/2010 2.  Stable appearance of left basal ganglia infarct. 3. Periventricular and corona radiata white matter hypodensities are most compatible with chronic ischemic microvascular white matter disease.  4.  Acute-on-chronic right sphenoid sinusitis.  Chronic ethmoid sinusitis.  Original Report Authenticated By: Dellia Cloud, M.D.   Mri Brain Without Contrast  03/27/2011  *RADIOLOGY REPORT*  Clinical Data:  Progressive weakness with slurred speech.  MRI HEAD WITHOUT CONTRAST MRA HEAD WITHOUT CONTRAST  Technique: Multiplanar, multiecho pulse sequences of the brain and surrounding structures were obtained according to standard protocol without intravenous contrast.  Angiographic images of the head were obtained using MRA technique without contrast.  Comparison: 03/26/2011 head CT.  05/02/2009 of brain MR.  MRI HEAD  Findings:  No acute infarct.  Remote right paracentral pontine infarct.  Prominent small vessel disease type changes.  No intracranial hemorrhage.  No intracranial mass lesion detected on  this unenhanced exam.  Atrophy.  Ventricular prominence may be related to atrophy rather than hydrocephalus.  Cervical spondylotic changes with spinal stenosis and mild cord flattening C3-4.  Transverse ligament hypertrophy.  Opacification right sphenoid sinus.  Mild mucosal thickening ethmoid sinus air cells. Minimal mastoid air cell opacification.  IMPRESSION: No acute infarct.  Remote right paracentral pontine infarct.  Prominent small vessel disease type changes.  Global atrophy.  Ventricular prominence may be related to atrophy rather hydrocephalus.  Cervical spondylotic changes with spinal stenosis C3-4.  Opacified right sphenoid sinus.  Mucocele not excluded.  Minimal mucosal thickening ethmoid sinus air cells.  MRA HEAD  Findings: Mild narrowing and irregularity of the cavernous segment of the right internal carotid artery.  Mild to moderate narrowing supraclinoid aspect of the right internal carotid artery with post stenotic dilation.  Aplastic A1 segment of the right anterior cerebral artery.  Mild narrowing right middle cerebral artery bifurcation.  Moderate to marked focal stenosis proximal right middle cerebral artery branch vessels.  Ectatic cavernous segment left internal carotid artery.  Mild to moderate focal narrowing proximal left supraclinoid internal carotid artery.  Mild to moderate narrowing A1 segment and M1 segment of the left anterior cerebral artery and left middle cerebral artery respectively.  Moderate narrowing proximal left middle cerebral artery branch vessels.  Moderate narrowing A2 segment anterior cerebral arteries bilaterally.  Right vertebral artery is dominant in size.  Mild irregularity of the vertebral arteries bilaterally.  Nonvisualization PICAs.  Moderate narrowing distal basilar artery.  Moderate narrowing proximal AICAs bilaterally.  Poor delineation with moderate to marked narrowing superior cerebellar arteries more notable on the right.  Moderate to marked narrowing of  portions of the proximal posterior cerebral artery bilaterally with moderate branch vessel irregularity bilaterally.  No aneurysm noted.  IMPRESSION: Prominent intracranial atherosclerotic type changes as noted above.  Original Report Authenticated By: Fuller Canada, M.D.   Mr Mra Head/brain Wo Cm  03/27/2011  *RADIOLOGY REPORT*  Clinical Data:  Progressive weakness with slurred speech.  MRI HEAD WITHOUT CONTRAST MRA HEAD WITHOUT CONTRAST  Technique: Multiplanar, multiecho pulse sequences of the brain and surrounding structures were obtained according to standard protocol without intravenous contrast.  Angiographic images of the head were obtained using MRA technique without contrast.  Comparison: 03/26/2011 head CT.  05/02/2009 of brain MR.  MRI HEAD  Findings:  No acute infarct.  Remote right paracentral pontine infarct.  Prominent small vessel disease type changes.  No intracranial hemorrhage.  No intracranial mass lesion detected on this unenhanced exam.  Atrophy.  Ventricular prominence may be related to atrophy rather than hydrocephalus.  Cervical spondylotic changes with spinal stenosis and mild cord flattening C3-4.  Transverse ligament hypertrophy.  Opacification right sphenoid sinus.  Mild mucosal thickening ethmoid  sinus air cells. Minimal mastoid air cell opacification.  IMPRESSION: No acute infarct.  Remote right paracentral pontine infarct.  Prominent small vessel disease type changes.  Global atrophy.  Ventricular prominence may be related to atrophy rather hydrocephalus.  Cervical spondylotic changes with spinal stenosis C3-4.  Opacified right sphenoid sinus.  Mucocele not excluded.  Minimal mucosal thickening ethmoid sinus air cells.  MRA HEAD  Findings: Mild narrowing and irregularity of the cavernous segment of the right internal carotid artery.  Mild to moderate narrowing supraclinoid aspect of the right internal carotid artery with post stenotic dilation.  Aplastic A1 segment of the right  anterior cerebral artery.  Mild narrowing right middle cerebral artery bifurcation.  Moderate to marked focal stenosis proximal right middle cerebral artery branch vessels.  Ectatic cavernous segment left internal carotid artery.  Mild to moderate focal narrowing proximal left supraclinoid internal carotid artery.  Mild to moderate narrowing A1 segment and M1 segment of the left anterior cerebral artery and left middle cerebral artery respectively.  Moderate narrowing proximal left middle cerebral artery branch vessels.  Moderate narrowing A2 segment anterior cerebral arteries bilaterally.  Right vertebral artery is dominant in size.  Mild irregularity of the vertebral arteries bilaterally.  Nonvisualization PICAs.  Moderate narrowing distal basilar artery.  Moderate narrowing proximal AICAs bilaterally.  Poor delineation with moderate to marked narrowing superior cerebellar arteries more notable on the right.  Moderate to marked narrowing of portions of the proximal posterior cerebral artery bilaterally with moderate branch vessel irregularity bilaterally.  No aneurysm noted.  IMPRESSION: Prominent intracranial atherosclerotic type changes as noted above.  Original Report Authenticated By: Fuller Canada, M.D.     Today   Subjective:   Julie Frank today has no headache,no chest abdominal pain,no new weakness tingling or numbness, feels much better .  Objective:   Blood pressure 152/76, pulse 71, temperature 98 F (36.7 C), temperature source Oral, resp. rate 18, height 5' (1.524 m), weight 49.487 kg (109 lb 1.6 oz), SpO2 94.00%.  Intake/Output Summary (Last 24 hours) at 03/29/11 1302 Last data filed at 03/28/11 1700  Gross per 24 hour  Intake     75 ml  Output      0 ml  Net     75 ml    Exam Awake Alert, Oriented x 2, No new F.N deficits, Normal affect [place].AT,PERRAL Supple Neck,No JVD, No cervical lymphadenopathy appriciated.  Symmetrical Chest wall movement, Good air movement  bilaterally, CTAB RRR,No Gallops,Rubs or new Murmurs, No Parasternal Heave +ve B.Sounds, Abd Soft, Non tender, No organomegaly appriciated, No rebound -guarding or rigidity. No Cyanosis, Clubbing or edema, No new Rash or bruise  Data Review      CBC w Diff: Lab Results  Component Value Date   WBC 5.2 03/27/2011   HGB 13.2 03/27/2011   HCT 41.1 03/27/2011   PLT 200 03/27/2011   LYMPHOPCT 13 03/26/2011   MONOPCT 12 03/26/2011   EOSPCT 0 03/26/2011   BASOPCT 0 03/26/2011   CMP: Lab Results  Component Value Date   NA 140 03/28/2011   K 4.2 03/28/2011   CL 111 03/28/2011   CO2 19 03/28/2011   BUN 17 03/28/2011   CREATININE 0.68 03/28/2011   PROT 6.4 03/27/2011   ALBUMIN 3.0* 03/27/2011   BILITOT 0.5 03/27/2011   ALKPHOS 44 03/27/2011   AST 20 03/27/2011   ALT 8 03/27/2011  .  Micro Results Recent Results (from the past 240 hour(s))  MRSA PCR SCREENING  Status: Normal   Collection Time   03/27/11  6:44 AM      Component Value Range Status Comment   MRSA by PCR NEGATIVE  NEGATIVE  Final      Discharge Instructions     Follow with Primary MD at SNF in 3 days   Get CBC, CMP, checked 3 days by Primary MD and again as instructed by your Primary MD.   Get Medicines reviewed and adjusted.  Please request your Prim.MD to go over all Hospital Tests and Procedure/Radiological results at the follow up, please get all Hospital records sent to your Prim MD by signing hospital release before you go home.  Activity: Fall precautions use walker/cane & assistance as needed  Diet: Cardiac , Aspiration precautions.  For Heart failure patients - Check your Weight same time everyday, if you gain over 2 pounds, or you develop in leg swelling, experience more shortness of breath or chest pain, call your Primary MD immediately. Follow Cardiac Low Salt Diet and 1.8 lit/day fluid restriction.  Disposition Home  If you experience worsening of your admission symptoms, develop shortness of breath, life threatening emergency,  suicidal or homicidal thoughts you must seek medical attention immediately by calling 911 or calling your MD immediately  if symptoms less severe.  You Must read complete instructions/literature along with all the possible adverse reactions/side effects for all the Medicines you take and that have been prescribed to you. Take any new Medicines after you have completely understood and accpet all the possible adverse reactions/side effects.     Discharge Medications   Medication List  As of 03/29/2011  1:02 PM   START taking these medications         moxifloxacin 400 MG tablet   Commonly known as: AVELOX   Take 1 tablet (400 mg total) by mouth daily.      simvastatin 5 MG tablet   Commonly known as: ZOCOR   Take 1 tablet (5 mg total) by mouth daily at 6 PM.         CHANGE how you take these medications         aspirin 325 MG tablet   Take 1 tablet (325 mg total) by mouth daily.   What changed: - medication strength - dose         CONTINUE taking these medications         alendronate 70 MG tablet   Commonly known as: FOSAMAX      CALCIUM 600 + D PO      levothyroxine 25 MCG tablet   Commonly known as: SYNTHROID, LEVOTHROID      metoprolol succinate 25 MG 24 hr tablet   Commonly known as: TOPROL-XL      multivitamin tablet      omeprazole 20 MG capsule   Commonly known as: PRILOSEC      polyethylene glycol packet   Commonly known as: MIRALAX / GLYCOLAX      TYLENOL ARTHRITIS PAIN 650 MG CR tablet   Generic drug: acetaminophen         STOP taking these medications         hydrocortisone 25 MG suppository          Where to get your medications    These are the prescriptions that you need to pick up.   You may get these medications from any pharmacy.         aspirin 325 MG tablet   moxifloxacin 400 MG tablet  simvastatin 5 MG tablet             Total Time in preparing paper work, data evaluation and todays exam - 35 minutes  Leroy Sea M.D  on 03/29/2011 at 1:02 PM  [facility] Office  [phone]

## 2011-05-09 ENCOUNTER — Encounter: Payer: Self-pay | Admitting: Cardiology

## 2011-10-11 ENCOUNTER — Encounter (HOSPITAL_BASED_OUTPATIENT_CLINIC_OR_DEPARTMENT_OTHER): Payer: Medicare Other | Attending: General Surgery

## 2011-10-11 DIAGNOSIS — Z8673 Personal history of transient ischemic attack (TIA), and cerebral infarction without residual deficits: Secondary | ICD-10-CM | POA: Insufficient documentation

## 2011-10-11 DIAGNOSIS — I509 Heart failure, unspecified: Secondary | ICD-10-CM | POA: Insufficient documentation

## 2011-10-11 DIAGNOSIS — Z79899 Other long term (current) drug therapy: Secondary | ICD-10-CM | POA: Insufficient documentation

## 2011-10-11 DIAGNOSIS — L8990 Pressure ulcer of unspecified site, unspecified stage: Secondary | ICD-10-CM | POA: Insufficient documentation

## 2011-10-11 DIAGNOSIS — E039 Hypothyroidism, unspecified: Secondary | ICD-10-CM | POA: Insufficient documentation

## 2011-10-11 DIAGNOSIS — Z7982 Long term (current) use of aspirin: Secondary | ICD-10-CM | POA: Insufficient documentation

## 2011-10-11 DIAGNOSIS — L89609 Pressure ulcer of unspecified heel, unspecified stage: Secondary | ICD-10-CM | POA: Insufficient documentation

## 2011-10-11 NOTE — Progress Notes (Signed)
[facility]  NAME:  Julie Frank, Julie Frank             ACCOUNT NO.:  [id number]  MEDICAL RECORD NO.:  [id number]      DATE OF BIRTH:  Jul 25, 1909  PHYSICIAN:  Ardath Sax, M.D.           VISIT DATE:                                  OFFICE VISIT   This is 78 year old lady who comes to [place] because of a pressure sore on her left heel.  It is about a cm in diameter.  It occurred when she was at a [facility] because she had what was described as perhaps a TIA. She had had a frank CVA in 2008 and 2011.  She was there for an exercise program and somehow or another, she developed a pressure sore on her left heel, right now it is about a cm in diameter.  She has a history of congestive heart failure which is controlled now.  She is hypothyroid. She has had an appendectomy, bilateral cataracts, and a D and C of her uterus.  She is not on much in the way of medicine, just aspirin, MiraLAX, Zocor, Claritin, and Santyl.  Her blood pressure was 106/70, respirations 18, pulse is 90, her temperature 98.6.  While she was here, we went over if there were any problem.  She is very alert sitting up in a chair.  She does have a painful a pressure sore on her left heel, but her GI tract, lungs, and heart her all fantastic for a 78 year old woman.  She today had the pressure sore debrided a little bit superficially, just epidermis and then we put a DuoDerm on there to offload her left heel.  She will come back in a week and we will see how she is doing.  It is possible we may have to use an Oasis, but wants to see how the DuoDerm looks when she comes back in a week.     Ardath Sax, M.D.     PP/MEDQ  D:  10/11/2011  T:  10/11/2011  Job:  [id number]

## 2011-10-16 ENCOUNTER — Encounter: Payer: Self-pay | Admitting: Cardiology

## 2011-10-24 ENCOUNTER — Ambulatory Visit: Payer: Medicare Other | Admitting: Cardiology

## 2011-11-01 ENCOUNTER — Encounter (HOSPITAL_BASED_OUTPATIENT_CLINIC_OR_DEPARTMENT_OTHER): Payer: Medicare Other | Attending: General Surgery

## 2011-11-01 DIAGNOSIS — L89609 Pressure ulcer of unspecified heel, unspecified stage: Secondary | ICD-10-CM | POA: Insufficient documentation

## 2011-11-01 DIAGNOSIS — L8990 Pressure ulcer of unspecified site, unspecified stage: Secondary | ICD-10-CM | POA: Insufficient documentation

## 2011-11-08 ENCOUNTER — Encounter (HOSPITAL_BASED_OUTPATIENT_CLINIC_OR_DEPARTMENT_OTHER): Payer: Medicare Other

## 2011-11-20 ENCOUNTER — Ambulatory Visit: Payer: Medicare Other | Admitting: Cardiology

## 2011-12-04 ENCOUNTER — Ambulatory Visit: Payer: Medicare Other | Admitting: Cardiology

## 2011-12-07 ENCOUNTER — Encounter: Payer: Self-pay | Admitting: Cardiology

## 2011-12-10 ENCOUNTER — Encounter: Payer: Self-pay | Admitting: Cardiology

## 2011-12-24 ENCOUNTER — Ambulatory Visit: Payer: Medicare Other | Admitting: Cardiology

## 2012-01-10 ENCOUNTER — Encounter: Payer: Self-pay | Admitting: Cardiology

## 2012-01-11 ENCOUNTER — Encounter: Payer: Self-pay | Admitting: Cardiology

## 2012-01-14 ENCOUNTER — Encounter: Payer: Self-pay | Admitting: Cardiology

## 2012-01-14 ENCOUNTER — Telehealth: Payer: Self-pay | Admitting: Cardiology

## 2012-01-14 NOTE — Telephone Encounter (Signed)
Pt's dtr calling back re work in for tomorrow, thurs, or Friday, needs to be after 130pm , by the time the home gets her dressed , bathed and fed its at least 1230pm

## 2012-01-15 ENCOUNTER — Encounter: Payer: Self-pay | Admitting: Cardiology

## 2012-01-16 NOTE — Telephone Encounter (Signed)
Spoke to patient's daughter Britta Mccreedy 01/14/12.She stated she was concerned about her mothers care at [facility].States her mother has had recent pneumonia and was given Levaquin for 5 days.States potassium high and it has been repeated.Also mother is more confused and extremely fatigued.States a U/A and culture is to be to be done.Daughter was reassured and advised to keep appointment with Dr.Jordan 02/07/12.

## 2012-01-30 ENCOUNTER — Encounter: Payer: Self-pay | Admitting: Cardiology

## 2012-02-06 ENCOUNTER — Encounter: Payer: Self-pay | Admitting: Cardiology

## 2012-02-07 ENCOUNTER — Ambulatory Visit: Payer: Medicare Other | Admitting: Cardiology

## 2012-02-26 ENCOUNTER — Ambulatory Visit (INDEPENDENT_AMBULATORY_CARE_PROVIDER_SITE_OTHER): Payer: Medicare Other | Admitting: Cardiology

## 2012-02-26 ENCOUNTER — Encounter: Payer: Self-pay | Admitting: Cardiology

## 2012-02-26 VITALS — BP 130/59 | HR 63 | Ht 60.0 in | Wt 90.8 lb

## 2012-02-26 DIAGNOSIS — I340 Nonrheumatic mitral (valve) insufficiency: Secondary | ICD-10-CM

## 2012-02-26 DIAGNOSIS — I471 Supraventricular tachycardia, unspecified: Secondary | ICD-10-CM

## 2012-02-26 DIAGNOSIS — I6350 Cerebral infarction due to unspecified occlusion or stenosis of unspecified cerebral artery: Secondary | ICD-10-CM

## 2012-02-26 DIAGNOSIS — I10 Essential (primary) hypertension: Secondary | ICD-10-CM

## 2012-02-26 DIAGNOSIS — I639 Cerebral infarction, unspecified: Secondary | ICD-10-CM

## 2012-02-26 DIAGNOSIS — I059 Rheumatic mitral valve disease, unspecified: Secondary | ICD-10-CM

## 2012-02-26 DIAGNOSIS — I509 Heart failure, unspecified: Secondary | ICD-10-CM

## 2012-02-26 DIAGNOSIS — I498 Other specified cardiac arrhythmias: Secondary | ICD-10-CM

## 2012-02-26 DIAGNOSIS — R634 Abnormal weight loss: Secondary | ICD-10-CM

## 2012-02-26 NOTE — Progress Notes (Signed)
Van Clines Date of Birth: 28-Oct-1909   History of Present Illness: Julie Frank is seen today for followup. She is now 78 years old. She was last seen here in December of 2012. She has been residing at [facility]. She has a history of chronic diastolic heart failure and history of SVT. This has been an eventful year for her. She was admitted in January 2013 with a TIA. Echocardiogram at that time was unremarkable. She was treated with aspirin. Lasix was stopped on that admission and has not been resumed. In September of this year she was treated for an outpatient pneumonia. The following month she had recurrent bladder infections and received an additional 2 courses of antibiotics. Her daughter notes that she does have some chronic ankle swelling. Her breathing is doing okay now. Her appetite has been poor and according to our scale she has lost 25 pounds this past year. Her Fosamax and Zocor were discontinued. Currently she is just on aspirin and metoprolol from a cardiac standpoint.  Current Outpatient Prescriptions on File Prior to Visit  Medication Sig Dispense Refill  . acetaminophen (TYLENOL ARTHRITIS PAIN) 650 MG CR tablet Take 650 mg by mouth 2 (two) times daily.        Marland Kitchen aspirin 325 MG tablet Take 1 tablet (325 mg total) by mouth daily.  1 tablet  1  . Calcium Carbonate-Vitamin D (CALCIUM 600 + D PO) Take 1 tablet by mouth 2 (two) times daily.        Marland Kitchen levothyroxine (SYNTHROID, LEVOTHROID) 25 MCG tablet Take 25 mcg by mouth daily.        . metoprolol succinate (TOPROL-XL) 25 MG 24 hr tablet Take 25 mg by mouth daily.        . Multiple Vitamin (MULTIVITAMIN) tablet Take 1 tablet by mouth daily.        Marland Kitchen omeprazole (PRILOSEC) 20 MG capsule Take 20 mg by mouth daily.        . polyethylene glycol (MIRALAX / GLYCOLAX) packet Take 17 g by mouth every other day. As needed for constipation.        No Known Allergies  Past Medical History  Diagnosis Date  .  Mitral insufficiency     CHRONIC  . Hypertension   . Debility     GENERALIZED  . Hyperlipidemia   . OA (osteoarthritis)   . OP (osteoporosis)   . CHF (congestive heart failure)   . History of diastolic dysfunction   . Muscle weakness (generalized)   . Difficulty walking   . Metabolic encephalopathy   . Hypothyroidism   . HOH (hard of hearing)     "extremely"  . SVT (supraventricular tachycardia)     nonsustained  . Heart murmur   . Repeated falls 2010    "3 serious falls; hit head each time; staples 1st 2 times"  . Vascular dementia dx'd 2011  . Peripheral vascular disease   . Dry cough 03/26/11    "chronic; dx'd years ago as allergy related type of thing"  . Bruises easily   . Chronic back pain greater than 3 months duration   . CVA (cerebrovascular accident) 03/2006; 04/2009    residual "speech problems & short term memory decreased"  . PNA (pneumonia)   . UTI (lower urinary tract infection)     Past Surgical History  Procedure Date  . Removal colon polpys 12/2000    Villous adenoma of the cecum.  . Tonsillectomy and adenoidectomy   .  Appendectomy   . Cataract extraction w/ intraocular lens  implant, bilateral 1984  . Dilation and curettage of uterus 1960's    "several"    History  Smoking status  . Never Smoker   Smokeless tobacco  . Never Used    History  Alcohol Use No    Family History  Problem Relation Age of Onset  . Heart disease Mother     Review of Systems: The review of systems is positive for loss of hearing.  She gets around predominantly in a wheelchair. She does have significant degenerative joint disease.All other systems were reviewed and are negative.  Physical Exam: BP 130/59  Pulse 63  Ht 5' (1.524 m)  Wt 90 lb 12.8 oz (41.187 kg)  BMI 17.73 kg/m2 She is an elderly white female in no acute distress. She is hard of hearing. Her HEENT exam is unremarkable. She has no JVD or bruits. Lungs are fairly clear. Cardiac exam reveals a harsh  grade 2/6 systolic murmur at apex. Abdomen is soft and nontender. She has 1+ pretibial edema. Neuro: Alert and oriented x3. LABORATORY DATA: Labs reviewed from [facility] in October reveal normal chemistry panel and CBC. Thyroid studies have been normal.  Assessment / Plan: 1. Chronic diastolic heart failure. She appears to be well compensated. She has some chronic ankle edema but her feet are dependent most of the day. I recommended sodium restriction and elevation of her feet when possible. I suggested that she wear support hose. I would not put her back on diuretic therapy given her profound weight loss over the past year. I'll followup again in 6 months.  2. SVT, well controlled on metoprolol.  3. History of TIA.  4. Weight loss. Certainly her recent infections have impacted her appetite. I've encouraged her to increase her calorie intake is much as possible.

## 2012-02-26 NOTE — Patient Instructions (Addendum)
Continue current therapy.  Encourage increase calorie intake.

## 2012-02-27 ENCOUNTER — Other Ambulatory Visit: Payer: Self-pay | Admitting: Cardiology

## 2012-02-28 ENCOUNTER — Telehealth: Payer: Self-pay | Admitting: Cardiology

## 2012-02-28 NOTE — Telephone Encounter (Signed)
Spoke to patient's daughter Britta Mccreedy she stated she had her mother weighed at [employer] 02/27/12 and she weighed 1071/2 lbs.States her mother has another UTI and will be treated with antibiotics.States will be seeing urology in 1/14 for frequent UTI's.

## 2012-02-28 NOTE — Telephone Encounter (Signed)
Pt calling re some info she has to add re her mother, pls call before noon if poss

## 2012-03-10 ENCOUNTER — Inpatient Hospital Stay (HOSPITAL_COMMUNITY): Payer: Medicare Other

## 2012-03-10 ENCOUNTER — Emergency Department (HOSPITAL_COMMUNITY): Payer: Medicare Other

## 2012-03-10 ENCOUNTER — Telehealth: Payer: Self-pay | Admitting: Cardiology

## 2012-03-10 ENCOUNTER — Inpatient Hospital Stay (HOSPITAL_COMMUNITY)
Admission: EM | Admit: 2012-03-10 | Discharge: 2012-03-13 | DRG: 062 | Disposition: A | Discharge status: Skilled Nursing Facility | Payer: Medicare Other | Attending: Neurology | Admitting: Neurology

## 2012-03-10 DIAGNOSIS — I059 Rheumatic mitral valve disease, unspecified: Secondary | ICD-10-CM | POA: Diagnosis present

## 2012-03-10 DIAGNOSIS — I739 Peripheral vascular disease, unspecified: Secondary | ICD-10-CM | POA: Diagnosis present

## 2012-03-10 DIAGNOSIS — M549 Dorsalgia, unspecified: Secondary | ICD-10-CM | POA: Diagnosis present

## 2012-03-10 DIAGNOSIS — I4891 Unspecified atrial fibrillation: Secondary | ICD-10-CM

## 2012-03-10 DIAGNOSIS — I10 Essential (primary) hypertension: Secondary | ICD-10-CM | POA: Diagnosis present

## 2012-03-10 DIAGNOSIS — I6340 Cerebral infarction due to embolism of unspecified cerebral artery: Principal | ICD-10-CM | POA: Diagnosis present

## 2012-03-10 DIAGNOSIS — I5032 Chronic diastolic (congestive) heart failure: Secondary | ICD-10-CM

## 2012-03-10 DIAGNOSIS — Z8249 Family history of ischemic heart disease and other diseases of the circulatory system: Secondary | ICD-10-CM

## 2012-03-10 DIAGNOSIS — I639 Cerebral infarction, unspecified: Secondary | ICD-10-CM

## 2012-03-10 DIAGNOSIS — Z79899 Other long term (current) drug therapy: Secondary | ICD-10-CM

## 2012-03-10 DIAGNOSIS — Z8673 Personal history of transient ischemic attack (TIA), and cerebral infarction without residual deficits: Secondary | ICD-10-CM

## 2012-03-10 DIAGNOSIS — E039 Hypothyroidism, unspecified: Secondary | ICD-10-CM | POA: Diagnosis present

## 2012-03-10 DIAGNOSIS — I672 Cerebral atherosclerosis: Secondary | ICD-10-CM | POA: Diagnosis present

## 2012-03-10 DIAGNOSIS — I6350 Cerebral infarction due to unspecified occlusion or stenosis of unspecified cerebral artery: Secondary | ICD-10-CM

## 2012-03-10 DIAGNOSIS — G8929 Other chronic pain: Secondary | ICD-10-CM | POA: Diagnosis present

## 2012-03-10 DIAGNOSIS — I509 Heart failure, unspecified: Secondary | ICD-10-CM | POA: Diagnosis present

## 2012-03-10 DIAGNOSIS — F0150 Vascular dementia without behavioral disturbance: Secondary | ICD-10-CM | POA: Diagnosis present

## 2012-03-10 DIAGNOSIS — Z7982 Long term (current) use of aspirin: Secondary | ICD-10-CM

## 2012-03-10 DIAGNOSIS — E785 Hyperlipidemia, unspecified: Secondary | ICD-10-CM | POA: Diagnosis present

## 2012-03-10 DIAGNOSIS — M1990 Unspecified osteoarthritis, unspecified site: Secondary | ICD-10-CM | POA: Diagnosis present

## 2012-03-10 DIAGNOSIS — Z860100 Personal history of colon polyps, unspecified: Secondary | ICD-10-CM

## 2012-03-10 DIAGNOSIS — R4701 Aphasia: Secondary | ICD-10-CM | POA: Diagnosis present

## 2012-03-10 DIAGNOSIS — M810 Age-related osteoporosis without current pathological fracture: Secondary | ICD-10-CM | POA: Diagnosis present

## 2012-03-10 DIAGNOSIS — G8100 Flaccid hemiplegia affecting unspecified side: Secondary | ICD-10-CM | POA: Diagnosis present

## 2012-03-10 DIAGNOSIS — N390 Urinary tract infection, site not specified: Secondary | ICD-10-CM | POA: Diagnosis present

## 2012-03-10 LAB — CBC
MCV: 89.1 fL (ref 78.0–100.0)
Platelets: 237 10*3/uL (ref 150–400)
RDW: 16.5 % — ABNORMAL HIGH (ref 11.5–15.5)
WBC: 7.8 10*3/uL (ref 4.0–10.5)

## 2012-03-10 LAB — COMPREHENSIVE METABOLIC PANEL
ALT: 12 U/L (ref 0–35)
AST: 29 U/L (ref 0–37)
CO2: 24 mEq/L (ref 19–32)
Calcium: 9.4 mg/dL (ref 8.4–10.5)
GFR calc non Af Amer: 46 mL/min — ABNORMAL LOW (ref >90)
Potassium: 4.9 mEq/L (ref 3.5–5.1)
Sodium: 136 mEq/L (ref 135–145)
Total Protein: 6.9 g/dL (ref 6.0–8.3)

## 2012-03-10 LAB — DIFFERENTIAL
Basophils Absolute: 0 10*3/uL (ref 0.0–0.1)
Eosinophils Relative: 2 % (ref 0–5)
Lymphocytes Relative: 24 % (ref 12–46)
Neutrophils Relative %: 65 % (ref 43–77)

## 2012-03-10 LAB — POCT I-STAT, CHEM 8
Calcium, Ion: 1.2 mmol/L (ref 1.13–1.30)
Chloride: 105 mEq/L (ref 96–112)
HCT: 46 % (ref 36.0–46.0)
Potassium: 5 mEq/L (ref 3.5–5.1)
Sodium: 139 mEq/L (ref 135–145)

## 2012-03-10 LAB — GLUCOSE, CAPILLARY: Glucose-Capillary: 103 mg/dL — ABNORMAL HIGH (ref 70–99)

## 2012-03-10 LAB — APTT: aPTT: 26 seconds (ref 24–37)

## 2012-03-10 LAB — PROTIME-INR: INR: 1.22 (ref 0.00–1.49)

## 2012-03-10 MED ORDER — ONDANSETRON HCL 4 MG/2ML IJ SOLN: 4.0000 mg | Freq: Four times a day (QID) | INTRAMUSCULAR | Status: DC | PRN

## 2012-03-10 MED ORDER — SODIUM CHLORIDE 0.9 % IV SOLN: 250.0000 mL | Freq: Once | INTRAVENOUS | Status: AC

## 2012-03-10 MED ORDER — ALTEPLASE (STROKE) FULL DOSE INFUSION
44.0000 mg | Freq: Once | INTRAVENOUS | Status: AC
  Administered 2012-03-10: 44 mg via INTRAVENOUS
  Filled 2012-03-10: qty 44

## 2012-03-10 MED ORDER — SENNOSIDES-DOCUSATE SODIUM 8.6-50 MG PO TABS
1.0000 | ORAL_TABLET | Freq: Every evening | ORAL | Status: DC | PRN
  Filled 2012-03-10: qty 1

## 2012-03-10 MED ORDER — ACETAMINOPHEN 650 MG RE SUPP: 650.0000 mg | RECTAL | Status: DC | PRN

## 2012-03-10 MED ORDER — ACETAMINOPHEN 325 MG PO TABS: 650.0000 mg | ORAL_TABLET | ORAL | Status: DC | PRN

## 2012-03-10 MED ORDER — SODIUM CHLORIDE 0.9 % IV SOLN
INTRAVENOUS | Status: DC
  Administered 2012-03-10 – 2012-03-11 (×3): via INTRAVENOUS

## 2012-03-10 MED ORDER — ALTEPLASE (STROKE) FULL DOSE INFUSION
0.9000 mg/kg | Freq: Once | INTRAVENOUS | Status: DC
  Filled 2012-03-10: qty 44

## 2012-03-10 MED ORDER — PANTOPRAZOLE SODIUM 40 MG IV SOLR
40.0000 mg | Freq: Every day | INTRAVENOUS | Status: DC
  Administered 2012-03-10 – 2012-03-11 (×2): 40 mg via INTRAVENOUS
  Filled 2012-03-10 (×3): qty 40

## 2012-03-10 MED ORDER — LABETALOL HCL 5 MG/ML IV SOLN: 10.0000 mg | INTRAVENOUS | Status: DC | PRN

## 2012-03-10 NOTE — H&P (Signed)
Admission H&P    Chief Complaint: stroke HPI: Julie Frank is an 78 y.o. female with a previous history of stroke and TIA, who was last seen normal by family at 11:30.  Family returned after lunch and found patient nonverbal, right sided hemiplegia at 12:15. Code stroke was called and patient was brought to [facility]. On arrival patient showed a left gaze deviation, right UE plegia and intermittent movement of left leg.  Patient was mute. Patient has a history of chronic atrial fibrillation. She has not been on anticoagulation therapy. She's been taking aspirin daily.  LSN :11:30 tPA administered.  NIHSS 24  Past Medical History  Diagnosis Date  . Mitral insufficiency     CHRONIC  . Hypertension   . Debility     GENERALIZED  . Hyperlipidemia   . OA (osteoarthritis)   . OP (osteoporosis)   . CHF (congestive heart failure)   . History of diastolic dysfunction   . Muscle weakness (generalized)   . Difficulty walking   . Metabolic encephalopathy   . Hypothyroidism   . HOH (hard of hearing)     "extremely"  . SVT (supraventricular tachycardia)     nonsustained  . Heart murmur   . Repeated falls 2010    "3 serious falls; hit head each time; staples 1st 2 times"  . Vascular dementia dx'd 2011  . Peripheral vascular disease   . Dry cough 03/26/11    "chronic; dx'd years ago as allergy related type of thing"  . Bruises easily   . Chronic back pain greater than 3 months duration   . CVA (cerebrovascular accident) 03/2006; 04/2009    residual "speech problems & short term memory decreased"  . PNA (pneumonia)   . UTI (lower urinary tract infection)     Past Surgical History  Procedure Date  . Removal colon polpys 12/2000    Villous adenoma of the cecum.  . Tonsillectomy and adenoidectomy   . Appendectomy   . Cataract extraction w/ intraocular lens  implant, bilateral 1984  . Dilation and curettage of uterus 1960's    "several"    Family History  Problem  Relation Age of Onset  . Heart disease Mother    Social History:  reports that she has never smoked. She has never used smokeless tobacco. She reports that she does not drink alcohol or use illicit drugs.  Allergies: No Known Allergies   (Not in a hospital admission)  ROS: Unable to obtain due to no verbal out put  Physical Examination: There were no vitals taken for this visit.  HEENT-  Normocephalic, no lesions, without obvious abnormality.  Normal external eye and conjunctiva.  Normal TM's bilaterally.  Normal auditory canals and external ears. Normal external nose, mucus membranes and septum.  Normal pharynx. Neck supple with no masses, nodes, nodules or enlargement. Cardiovascular - irregularly irregular rhythm and S1, S2 normal Lungs - chest clear, no wheezing, rales, normal symmetric air entry, Heart exam - S1, S2 normal, no murmur, no gallop, rate regular Abdomen - soft, non-tender; bowel sounds normal; no masses,  no organomegaly Extremities - less then 2 second capillary refill, no joint deformities, effusion, or inflammation, no edema and no skin discoloration  Neurologic Examination: Mental Status: Follows no commands, right eyes deviated to the left, no verbal out put.  Cranial Nerves: II: Discs flat bilaterally; blinks to threat on the left, No blink to threat on the right.  pupils equal, round, reactive to light and accommodation  III,IV, VI: ptosis not present,eyes deviated to the left and do not cross midline with oculocephalic movement.  V,VII: smile symmetric, facial light touch sensation normal bilaterally VIII: hearing intact as she will look to the left when spoken to IX,X: gag reflex present XI: shrug left shoulder spontaniously XII: midline tongue extension Motor: Lifts left arm antigravity spontaneously and purposefully, right rotates right arm in spontaneously and to pain, withdraws right leg to pain but does not lift off bed, moves left leg spontaneously  and purposefully.  Tone and bulk:normal tone throughout; no atrophy noted Sensory: responds to painful stimuli all extremities left >right.  Deep Tendon Reflexes: 2+ and symmetric throughout UE, 1+ bilateral KJ, no AJ Plantars: Right: downgoing   Left: downgoing Cerebellar: Unable to asess CV: pulses palpable throughout     No results found for this or any previous visit (from the past 48 hour(s)). Ct Head Wo Contrast  03/10/2012  *RADIOLOGY REPORT*  Clinical Data: Right side weakness.  Aphasia  CT HEAD WITHOUT CONTRAST  Technique:  Contiguous axial images were obtained from the base of the skull through the vertex without contrast.  Comparison: Brain MRI 02/24/2012 and CT scan 03/26/2011.  Findings: Cortical atrophy and chronic microvascular ischemic change are again seen.  Remote left basal ganglia and sub insular lacunar infarcts are unchanged.  No acute abnormality including infarct, hemorrhage, mass lesion, mass effect, midline shift or abnormal extra-axial fluid collection.  No hydrocephalus or pneumocephalus.  Calvarium intact.  IMPRESSION:  1.  No acute finding. 2.  Atrophy and chronic microvascular ischemic change.   Original Report Authenticated By: Holley Dexter, M.D.    Felicie Morn PA-C [facility] [phone]  03/10/2012, 2:06 PM   Assessment 78 YO female with chronic atrial fibrillation not on anticoagulation, presenting with stroke symptoms consistent with left MCA distribution infarct in the setting of A-fib. IV tPA was administered and patient will be admitted to neuro ICU on stroke service.    Plan: 1. HgbA1c, fasting lipid panel 2. MRI, MRA  of the brain without contrast 3. PT consult, OT consult, Speech consult 4. Echocardiogram 5. Carotid dopplers 6. Prophylactic therapy-Plavix 75 mg per day if CT scan 24 hours post TPA administration shows no signs of intracranial hemorrhage. 7. Risk  Factor modification   Felicie Morn, PA-C. [facility] [phone]  This patient required complex initial evaluation and decision-making as well as family counseling regarding risk of thrombolytic therapy. Tota critical care time was 90 minutes.  Venetia Maxon M.D. [facility] [phone]

## 2012-03-10 NOTE — Progress Notes (Signed)
  Echocardiogram 2D Echocardiogram has been performed.  Julie Frank 03/10/2012, 6:22 PM

## 2012-03-10 NOTE — ED Notes (Signed)
Notified RN of CBG 103

## 2012-03-10 NOTE — Code Documentation (Signed)
78 year old female presents to ED via [facility] as Code stroke.  Code stroke called at 1303 wuth ETA 5 mins.  Patient arrived at 11.  EDP exam at 1317.  Stroke team arrival at 1311.  LSW 1130.  Patient resides at [facility].  Staff there report patient was seen at 1130 rolling herself to the lunch room -they spoke to her and patient was at her baseline.  Staff saw her next at 1215 slumped to the side in her wheelchair and aphasic. To CT scan at 1318.  NIHSS 24.  Dr. Roseanne Reno on phone with patients daughter Britta Mccreedy who is POA.  Full dose tpa started at 1404.

## 2012-03-10 NOTE — ED Provider Notes (Signed)
History     CSN: [id number]  Arrival date & time 03/10/12  1317   First MD Initiated Contact with Patient 03/10/12 1322      Chief Complaint  Patient presents with  . Code Stroke     The history is provided by the EMS personnel and the nursing home. The history is limited by the condition of the patient (aphasia).   Pt was seen at 1315.  Per EMS and NH report, NH staff checked on pt before lunch and then when they checked on her again approximately 1215 they noticed pt was aphasic with right facial droop, flaccid RUE and RLE.  Code Stroke called by EMS en route.     Past Medical History  Diagnosis Date  . Mitral insufficiency     CHRONIC  . Hypertension   . Debility     GENERALIZED  . Hyperlipidemia   . OA (osteoarthritis)   . OP (osteoporosis)   . CHF (congestive heart failure)   . History of diastolic dysfunction   . Muscle weakness (generalized)   . Difficulty walking   . Metabolic encephalopathy   . Hypothyroidism   . HOH (hard of hearing)     "extremely"  . SVT (supraventricular tachycardia)     nonsustained  . Heart murmur   . Repeated falls 2010    "3 serious falls; hit head each time; staples 1st 2 times"  . Vascular dementia dx'd 2011  . Peripheral vascular disease   . Dry cough 03/26/11    "chronic; dx'd years ago as allergy related type of thing"  . Bruises easily   . Chronic back pain greater than 3 months duration   . CVA (cerebrovascular accident) 03/2006; 04/2009    residual "speech problems & short term memory decreased"  . PNA (pneumonia)   . UTI (lower urinary tract infection)     Past Surgical History  Procedure Date  . Removal colon polpys 12/2000    Villous adenoma of the cecum.  . Tonsillectomy and adenoidectomy   . Appendectomy   . Cataract extraction w/ intraocular lens  implant, bilateral 1984  . Dilation and curettage of uterus 1960's    "several"    Family History  Problem Relation Age of Onset  . Heart disease Mother      History  Substance Use Topics  . Smoking status: Never Smoker   . Smokeless tobacco: Never Used  . Alcohol Use: No    Review of Systems  Unable to perform ROS: Other    Allergies  Review of patient's allergies indicates no known allergies.  Home Medications   Current Outpatient Rx  Name  Route  Sig  Dispense  Refill  . ACETAMINOPHEN ER 650 MG PO TBCR   Oral   Take 650 mg by mouth 2 (two) times daily.           . ASPIRIN 325 MG PO TABS   Oral   Take 1 tablet (325 mg total) by mouth daily.   1 tablet   1   . CALCIUM 600 + D PO   Oral   Take 1 tablet by mouth 2 (two) times daily.           Marland Kitchen LEVOTHYROXINE SODIUM 25 MCG PO TABS   Oral   Take 25 mcg by mouth daily.           Marland Kitchen METOPROLOL SUCCINATE ER 25 MG PO TB24   Oral   Take 25 mg by mouth daily.           Marland Kitchen  ONE-DAILY MULTI VITAMINS PO TABS   Oral   Take 1 tablet by mouth daily.           . NON FORMULARY      Magic cup         . OMEPRAZOLE 20 MG PO CPDR   Oral   Take 20 mg by mouth daily.           Marland Kitchen POLYETHYLENE GLYCOL 3350 PO PACK   Oral   Take 17 g by mouth every other day. As needed for constipation.           There were no vitals taken for this visit.  Physical Exam 1315: Physical examination:  Nursing notes reviewed; Vital signs and O2 SAT reviewed;  Constitutional: Well developed, Well nourished, In no acute distress; Head:  Normocephalic, atraumatic; Eyes: EOMI, PERRL, No scleral icterus; ENMT: Mouth and pharynx normal, Mucous membranes dry; Neck: Supple, Full range of motion, No lymphadenopathy; Cardiovascular: Irregular rate and rhythm, No gallop; Respiratory: Breath sounds clear & equal bilaterally, No wheezes. Normal respiratory effort/excursion; Chest: Nontender, Movement normal; Abdomen: Soft, Nontender, Nondistended, Normal bowel sounds;; Extremities: Pulses normal, No tenderness, No edema, No calf edema or asymmetry.; Neuro: Awake, alert, eyes open, non-verbal.  Left  gaze preference. +right facial droop. Will not spontaneously move RUE, will withdrawal RLE to stimulus. Will not grip with right hand. Moves LUE and LLE spontaneously.; Skin: Color normal, Warm, Dry.   ED Course  Procedures   1315:  Pt to CT scan with Code Stroke team.  1335:  Neuro Dr. Roseanne Reno at bedside.    1340:  Neuro MD has spoken with pt's daughter regarding treatment.  Neuro ordered TPA. They will admit.    MDM  MDM Reviewed: nursing note, vitals and previous chart Reviewed previous: ECG Interpretation: ECG, labs, x-ray and CT scan Total time providing critical care: 30-74 minutes. This excludes time spent performing separately reportable procedures and services. Consults: admitting MD and neurology   CRITICAL CARE Performed by: Laray Anger Total critical care time: 35 Critical care time was exclusive of separately billable procedures and treating other patients. Critical care was necessary to treat or prevent imminent or life-threatening deterioration. Critical care was time spent personally by me on the following activities: development of treatment plan with patient and/or surrogate as well as nursing, discussions with consultants, evaluation of patient's response to treatment, examination of patient, obtaining history from patient or surrogate, ordering and performing treatments and interventions, ordering and review of laboratory studies, ordering and review of radiographic studies, pulse oximetry and re-evaluation of patient's condition.    Date: 03/10/2012  Rate: 64  Rhythm: normal sinus rhythm and premature atrial contractions (PAC)  QRS Axis: left  Intervals: QT prolonged, QTc 504  ST/T Wave abnormalities: normal  Conduction Disutrbances:right bundle branch block and left anterior fascicular block  Narrative Interpretation:   Old EKG Reviewed: unchanged; no significant changes from previous EKG dated 03/26/2011.   Results for orders placed during the  hospital encounter of 03/10/12  CBC      Component Value Range   WBC 7.8  4.0 - 10.5 K/uL   RBC 5.06  3.87 - 5.11 MIL/uL   Hemoglobin 14.6  12.0 - 15.0 g/dL   HCT 04.5  40.9 - 81.1 %   MCV 89.1  78.0 - 100.0 fL   MCH 28.9  26.0 - 34.0 pg   MCHC 32.4  30.0 - 36.0 g/dL   RDW 91.4 (*) 78.2 - 95.6 %  Platelets 237  150 - 400 K/uL  DIFFERENTIAL      Component Value Range   Neutrophils Relative 65  43 - 77 %   Neutro Abs 5.1  1.7 - 7.7 K/uL   Lymphocytes Relative 24  12 - 46 %   Lymphs Abs 1.8  0.7 - 4.0 K/uL   Monocytes Relative 10  3 - 12 %   Monocytes Absolute 0.7  0.1 - 1.0 K/uL   Eosinophils Relative 2  0 - 5 %   Eosinophils Absolute 0.1  0.0 - 0.7 K/uL   Basophils Relative 0  0 - 1 %   Basophils Absolute 0.0  0.0 - 0.1 K/uL  PROTIME-INR      Component Value Range   Prothrombin Time 15.2  11.6 - 15.2 seconds   INR 1.22  0.00 - 1.49  APTT      Component Value Range   aPTT 26  24 - 37 seconds  POCT I-STAT, CHEM 8      Component Value Range   Sodium 139  135 - 145 mEq/L   Potassium 5.0  3.5 - 5.1 mEq/L   Chloride 105  96 - 112 mEq/L   BUN 28 (*) 6 - 23 mg/dL   Creatinine, Ser 1.61  0.50 - 1.10 mg/dL   Glucose, Bld 096 (*) 70 - 99 mg/dL   Calcium, Ion 0.45  4.09 - 1.30 mmol/L   TCO2 28  0 - 100 mmol/L   Hemoglobin 15.6 (*) 12.0 - 15.0 g/dL   HCT 81.1  91.4 - 78.2 %  GLUCOSE, CAPILLARY      Component Value Range   Glucose-Capillary 103 (*) 70 - 99 mg/dL    Ct Head Wo Contrast 03/10/2012  *RADIOLOGY REPORT*  Clinical Data: Right side weakness.  Aphasia  CT HEAD WITHOUT CONTRAST  Technique:  Contiguous axial images were obtained from the base of the skull through the vertex without contrast.  Comparison: Brain MRI 02/24/2012 and CT scan 03/26/2011.  Findings: Cortical atrophy and chronic microvascular ischemic change are again seen.  Remote left basal ganglia and sub insular lacunar infarcts are unchanged.  No acute abnormality including infarct, hemorrhage, mass lesion,  mass effect, midline shift or abnormal extra-axial fluid collection.  No hydrocephalus or pneumocephalus.  Calvarium intact.  IMPRESSION:  1.  No acute finding. 2.  Atrophy and chronic microvascular ischemic change.   Original Report Authenticated By: Holley Dexter, M.D.         Laray Anger, DO 03/10/12 [phone]

## 2012-03-10 NOTE — Telephone Encounter (Signed)
Pt at [facility] had a massive stroke, pt's daughter wants to talk to cheryl

## 2012-03-10 NOTE — ED Notes (Signed)
Daughter here- stated she had eaten lunch and wheeled self back to rm, was found by staff at 12:15.

## 2012-03-10 NOTE — Telephone Encounter (Signed)
Spoke to patient's daughter Britta Mccreedy she stated her mother had a massive stroke this afternoon and is in [facility] ICU.Will let Dr.Jordan know.

## 2012-03-10 NOTE — ED Notes (Signed)
To ED  Via [facility] from [facility]  With stroke symptoms noticed at 12:30. Staff states saw pt nml at 11:30, when nursing staff came back around to check, pt was slumped to right side, right facial droop, no acknowledgement of right side.

## 2012-03-11 ENCOUNTER — Encounter (HOSPITAL_COMMUNITY): Payer: Self-pay | Admitting: *Deleted

## 2012-03-11 ENCOUNTER — Inpatient Hospital Stay (HOSPITAL_COMMUNITY): Payer: Medicare Other

## 2012-03-11 DIAGNOSIS — I509 Heart failure, unspecified: Secondary | ICD-10-CM

## 2012-03-11 DIAGNOSIS — I5032 Chronic diastolic (congestive) heart failure: Secondary | ICD-10-CM

## 2012-03-11 LAB — LIPID PANEL
HDL: 43 mg/dL (ref >39)
LDL Cholesterol: 112 mg/dL — ABNORMAL HIGH (ref 0–99)
Total CHOL/HDL Ratio: 4.3 RATIO
Triglycerides: 153 mg/dL — ABNORMAL HIGH (ref <150)
VLDL: 31 mg/dL (ref 0–40)

## 2012-03-11 MED ORDER — ASPIRIN 325 MG PO TABS
325.0000 mg | ORAL_TABLET | Freq: Every day | ORAL | Status: DC
  Administered 2012-03-11: 325 mg via ORAL
  Filled 2012-03-11 (×2): qty 1

## 2012-03-11 NOTE — Clinical Social Work Psychosocial (Signed)
     Clinical Social Work Department BRIEF PSYCHOSOCIAL ASSESSMENT 03/11/2012  Patient:  Julie Frank, Julie Frank     Account Number:  [id number]     Admit date:  03/10/2012  Clinical Social Worker:  Verl Blalock  Date/Time:  03/11/2012 10:45 AM  Referred by:  RN  Date Referred:  03/11/2012 Referred for  SNF Placement   Other Referral:   Interview type:  Family Other interview type:   Patient currently working with speech therapy and experiencing some expressive aphasia.  Spoke with patient daughter Julie Frank) over the phone.    PSYCHOSOCIAL DATA Living Status:  FACILITY Admitted from facility:  [facility] Level of care:  Skilled Nursing Facility Primary support name:  Julie Frank, Julie Frank   [phone] Primary support relationship to patient:  CHILD, ADULT Degree of support available:   Strong (HCPOA)    CURRENT CONCERNS Current Concerns  Post-Acute Placement   Other Concerns:    SOCIAL WORK ASSESSMENT / PLAN Clinical Social Worker spoke with patient daughter over the phone to offer support and discuss patient needs at discharge.  Patient daughter states that patient is a current resident at [facility] and has been there 2 years.  Patient was at [facility] in her wheelchair at dinner when staff found her slumped over.  Patient daughter plans for patient to return to [facility] at discharge.  Patient daughter expressed concerns regarding the ability for patient to admit back to [facility] under a new rehab benefit versus long term care.  CSW encouraged patient daughter to contact facility directly to determine patient level of care upon return.    Clinical Social Worker will remain available for support and to facilitate patient discharge needs once medically stable.   Assessment/plan status:  Psychosocial Support/Ongoing Assessment of Needs Other assessment/ plan:   Information/referral to community resources:   Visual merchandiser provided  patient daughter with appropriate contact information for CSW staff and unit staff.    PATIENTS/FAMILYS RESPONSE TO PLAN OF CARE: Patient alert and oriented to person and place by appropriately nodding yes and no.  Patient is experiencing expressive aphasia and having difficulties verbalizing. Patient daughter is very involved in patient care and is agreeable with hesitancy to return to [place].  Patient has been a resident there for 2 years and patient daughter understands the importance of familar surroundings for patient upon return.  Patient daughter verbalized her appreciation for CSW support and involvement.

## 2012-03-11 NOTE — Procedures (Signed)
Objective Swallowing Evaluation: Modified Barium Swallowing Study  Patient Details  Name: TECORA Frank MRN: [id number] Date of Birth: 12-Mar-1910  Today's Date: 03/11/2012 Time: 4098-1191 SLP Time Calculation (min): 20 min  Past Medical History:  Past Medical History  Diagnosis Date  . Mitral insufficiency     CHRONIC  . Hypertension   . Debility     GENERALIZED  . Hyperlipidemia   . OA (osteoarthritis)   . OP (osteoporosis)   . CHF (congestive heart failure)   . History of diastolic dysfunction   . Muscle weakness (generalized)   . Difficulty walking   . Metabolic encephalopathy   . Hypothyroidism   . HOH (hard of hearing)     "extremely"  . SVT (supraventricular tachycardia)     nonsustained  . Heart murmur   . Repeated falls 2010    "3 serious falls; hit head each time; staples 1st 2 times"  . Vascular dementia dx'd 2011  . Peripheral vascular disease   . Dry cough 03/26/11    "chronic; dx'd years ago as allergy related type of thing"  . Bruises easily   . Chronic back pain greater than 3 months duration   . CVA (cerebrovascular accident) 03/2006; 04/2009    residual "speech problems & short term memory decreased"  . PNA (pneumonia)   . UTI (lower urinary tract infection)    Past Surgical History:  Past Surgical History  Procedure Date  . Removal colon polpys 12/2000    Villous adenoma of the cecum.  . Tonsillectomy and adenoidectomy   . Appendectomy   . Cataract extraction w/ intraocular lens  implant, bilateral 1984  . Dilation and curettage of uterus 1960's    "several"   HPI:  [name] is a 78 y.o. female presenting with right hemiplegia, left gaze deviation, mutism. Status post IV t-PA 03/10/2012 at 1404. Imaging pending Infarct felt to be embolic secondary to known atrial fibrillation. Workup underway. On aspirin 81 mg orally every day prior to admission. Now on no antiplatelets as within 24h of tpa for secondary stroke prevention. Patient  with resultant  global aphasia with resolved right hemiparesis..     Assessment / Plan / Recommendation Clinical Impression  Dysphagia Diagnosis: Moderate oral phase dysphagia;Moderate pharyngeal phase dysphagia Clinical impression: Patient presents with a moderate primarily motor based oropharyngeal dysphagia. Oral phase characterized by decreased bolus containment, anterior labial spillage, delayed oral transit (greatest with soft solids), followed by loss of bolus over the base of the tongue. Eventual delayed swallow initiation results in penetration and aspiration of all liquid consistencies, and aspiration 1 x of solid bolus, all with throat clear in response. Deepest aspiration noted with thin liquids in which throat clear was ineffective to clear the airway. Pharyngeal strength intact and nectar thick liquids provided via tsp in order to control bolus size was effective to aid in airway protection during today's evaluation. Recommend conservative diet of dysphagia 1 (puree) with nectar thick liquids via tsp, meds crushed in puree, with full supervision for use of precautions and diet tolerance. SLP will f/u closely at bedside.     Treatment Recommendation  Therapy as outlined in treatment plan below    Diet Recommendation Dysphagia 1 (Puree);Nectar-thick liquid   Liquid Administration via: Spoon (tsp only) Medication Administration: Crushed with puree Supervision: Patient able to self feed;Full supervision/cueing for compensatory strategies Compensations: Slow rate;Small sips/bites;Check for pocketing Postural Changes and/or Swallow Maneuvers: Seated upright 90 degrees;Upright 30-60 min after meal  Other  Recommendations Oral Care Recommendations: Oral care BID Other Recommendations: Order thickener from pharmacy;Prohibited food (jello, ice cream, thin soups);Remove water pitcher   Follow Up Recommendations  Skilled Nursing facility    Frequency and Duration min 3x week  2 weeks        SLP Swallow Goals Patient will utilize recommended strategies during swallow to increase swallowing safety with: Maximum assistance Swallow Study Goal #2 - Progress: Not met   General HPI: [name] is a 78 y.o. female presenting with right hemiplegia, left gaze deviation, mutism. Status post IV t-PA 03/10/2012 at 1404. Imaging pending Infarct felt to be embolic secondary to known atrial fibrillation. Workup underway. On aspirin 81 mg orally every day prior to admission. Now on no antiplatelets as within 24h of tpa for secondary stroke prevention. Patient with resultant  global aphasia with resolved right hemiparesis.. Type of Study: Modified Barium Swallowing Study Reason for Referral: Objectively evaluate swallowing function Previous Swallow Assessment: MBS 03/21/10-recommended a regular diet (avoiding crumbly or dry solids), thin liquids with a chin tuck due to flash penetration of liquids. Per history on this MBS, patient had a previous MBS on 05/03/09 which recommended advancement from nectar thick liquids which patient was placed on while hospitalized to thin liquids (no report noted).  (bedside swallow eval 12/18 with recs for MBS) Diet Prior to this Study: NPO Temperature Spikes Noted: No Respiratory Status: Room air History of Recent Intubation: No Behavior/Cognition: Alert;Cooperative;Pleasant mood (global aphasia) Oral Cavity - Dentition: Adequate natural dentition Oral Motor / Sensory Function: Impaired - see Bedside swallow eval Self-Feeding Abilities: Able to feed self;Needs assist Patient Positioning: Upright in chair Baseline Vocal Quality: Clear Volitional Cough: Weak Volitional Swallow: Unable to elicit Anatomy: Other (Comment) (anterior curvature of cervical spine) Pharyngeal Secretions: Not observed secondary MBS    Reason for Referral Objectively evaluate swallowing function   Oral Phase Oral Preparation/Oral Phase Oral Phase: Impaired Oral -  Honey Oral - Honey Teaspoon: Lingual/palatal residue Oral - Nectar Oral - Nectar Teaspoon: Lingual/palatal residue Oral - Nectar Cup: Left anterior bolus loss;Right anterior bolus loss;Lingual/palatal residue Oral - Thin Oral - Thin Teaspoon: Left anterior bolus loss;Right anterior bolus loss;Lingual/palatal residue Oral - Solids Oral - Puree: Within functional limits Oral - Mechanical Soft: Delayed oral transit;Impaired mastication;Lingual/palatal residue   Pharyngeal Phase Pharyngeal Phase Pharyngeal Phase: Impaired Pharyngeal - Honey Pharyngeal - Honey Teaspoon: Delayed swallow initiation;Premature spillage to valleculae;Penetration/Aspiration before swallow Penetration/Aspiration details (honey teaspoon): Material enters airway, remains ABOVE vocal cords then ejected out (flash penetration) Pharyngeal - Nectar Pharyngeal - Nectar Teaspoon: Delayed swallow initiation;Premature spillage to pyriform sinuses;Premature spillage to valleculae;Penetration/Aspiration before swallow Penetration/Aspiration details (nectar teaspoon): Material enters airway, remains ABOVE vocal cords then ejected out (1 x to the cords with immediate and effective throat clear) Pharyngeal - Nectar Cup: Delayed swallow initiation;Premature spillage to pyriform sinuses;Penetration/Aspiration before swallow Penetration/Aspiration details (nectar cup): Material enters airway, CONTACTS cords then ejected out (with throat clear, effective) Pharyngeal - Thin Pharyngeal - Thin Teaspoon: Delayed swallow initiation;Premature spillage to pyriform sinuses;Penetration/Aspiration before swallow Penetration/Aspiration details (thin teaspoon): Material enters airway, passes BELOW cords and not ejected out despite cough attempt by patient Pharyngeal - Solids Pharyngeal - Puree: Delayed swallow initiation;Premature spillage to valleculae;Penetration/Aspiration before swallow Penetration/Aspiration details (puree): Material enters  airway, remains ABOVE vocal cords then ejected out Pharyngeal - Mechanical Soft: Delayed swallow initiation;Premature spillage to pyriform sinuses;Penetration/Aspiration before swallow Penetration/Aspiration details (mechanical soft): Material enters airway, passes BELOW cords then ejected out (throat clear effective  to clear aspirates)  Cervical Esophageal Phase    GO Ferdinand Lango MA, CCC-SLP [phone]    Cervical Esophageal Phase Cervical Esophageal Phase: [facility]         Doryce Mcgregory Meryl 03/11/2012, 2:04 PM

## 2012-03-11 NOTE — Progress Notes (Signed)
Agree with cancellation.  Pierre, [place] DPT [phone]

## 2012-03-11 NOTE — Progress Notes (Addendum)
PT/OT Cancellation Note  Patient Details Name: LILLI DEWALD MRN: [id number] DOB: [birth year]   Cancelled Treatment:    Reason Eval/Treat Not Completed: Patient at procedure or test/unavailable.  Attempted to see pt x 2.  However pt off floor.  Will attempt later if time allows.  Thanks!   Kem Parcher 03/11/2012, 11:31 AM Jake Shark, PT DPT [phone]

## 2012-03-11 NOTE — Progress Notes (Signed)
[place] Chard, OTR/L Pager: [phone] 03/11/2012

## 2012-03-11 NOTE — Evaluation (Signed)
Clinical/Bedside Swallow Evaluation Patient Details  Name: Julie Frank MRN: [id number] Date of Birth: 1910/01/16  Today's Date: 03/11/2012 Time: 1000-1012 SLP Time Calculation (min): 12 min  Past Medical History:  Past Medical History  Diagnosis Date  . Mitral insufficiency     CHRONIC  . Hypertension   . Debility     GENERALIZED  . Hyperlipidemia   . OA (osteoarthritis)   . OP (osteoporosis)   . CHF (congestive heart failure)   . History of diastolic dysfunction   . Muscle weakness (generalized)   . Difficulty walking   . Metabolic encephalopathy   . Hypothyroidism   . HOH (hard of hearing)     "extremely"  . SVT (supraventricular tachycardia)     nonsustained  . Heart murmur   . Repeated falls 2010    "3 serious falls; hit head each time; staples 1st 2 times"  . Vascular dementia dx'd 2011  . Peripheral vascular disease   . Dry cough 03/26/11    "chronic; dx'd years ago as allergy related type of thing"  . Bruises easily   . Chronic back pain greater than 3 months duration   . CVA (cerebrovascular accident) 03/2006; 04/2009    residual "speech problems & short term memory decreased"  . PNA (pneumonia)   . UTI (lower urinary tract infection)    Past Surgical History:  Past Surgical History  Procedure Date  . Removal colon polpys 12/2000    Villous adenoma of the cecum.  . Tonsillectomy and adenoidectomy   . Appendectomy   . Cataract extraction w/ intraocular lens  implant, bilateral 1984  . Dilation and curettage of uterus 1960's    "several"   HPI:  [name] is a 78 y.o. female presenting with right hemiplegia, left gaze deviation, mutism. Status post IV t-PA 03/10/2012 at 1404. Imaging pending Infarct felt to be embolic secondary to known atrial fibrillation. Workup underway. On aspirin 81 mg orally every day prior to admission. Now on no antiplatelets as within 24h of tpa for secondary stroke prevention. Patient with resultant  global aphasia  with resolved right hemiparesis..   Assessment / Plan / Recommendation Clinical Impression  Patient presents with overt indication of decreased airway protection with clinician provided po trials characterized by throat clearing and coughing post swallow. Suspect combination of delayed swallow reflex and potential pharyngeal residuals. Given advanced age, clinical indication of decreased airway protection at bedside, and h/o dysphagia, recommend proceeding with MBS to determine least restrictive diet.     Aspiration Risk  Moderate    Diet Recommendation NPO   Medication Administration: Via alternative means    Other  Recommendations Recommended Consults: MBS Oral Care Recommendations: Oral care QID   Follow Up Recommendations  Skilled Nursing facility       Pertinent Vitals/Pain n/a        Swallow Study    General HPI: [name] is a 78 y.o. female presenting with right hemiplegia, left gaze deviation, mutism. Status post IV t-PA 03/10/2012 at 1404. Imaging pending Infarct felt to be embolic secondary to known atrial fibrillation. Workup underway. On aspirin 81 mg orally every day prior to admission. Now on no antiplatelets as within 24h of tpa for secondary stroke prevention. Patient with resultant  global aphasia with resolved right hemiparesis.. Type of Study: Bedside swallow evaluation Previous Swallow Assessment: MBS 03/21/10-recommended a regular diet (avoiding crumbly or dry solids), thin liquids with a chin tuck due to flash penetration of liquids.  Per history on this MBS, patient had a previous MBS on 05/03/09 which recommended advancement from nectar thick liquids which patient was placed on while hospitalized to thin liquids (no report noted).  Diet Prior to this Study: NPO Temperature Spikes Noted: No Respiratory Status: Room air History of Recent Intubation: No Behavior/Cognition: Alert;Cooperative;Pleasant mood (global aphasia) Oral Cavity - Dentition:  Adequate natural dentition Self-Feeding Abilities: Able to feed self;Needs assist Patient Positioning: Upright in bed Baseline Vocal Quality: Clear Volitional Cough: Cognitively unable to elicit Volitional Swallow: Unable to elicit    Oral/Motor/Sensory Function Overall Oral Motor/Sensory Function: Appears within functional limits for tasks assessed (although oral apraxia and aphasia impacting exam)   Ice Chips Ice chips: Impaired Presentation: Spoon;Self Fed (with HOH assist) Oral Phase Impairments: Reduced labial seal Oral Phase Functional Implications: Right anterior spillage (mild) Pharyngeal Phase Impairments: Decreased hyoid-laryngeal movement;Throat Clearing - Immediate   Thin Liquid Thin Liquid: Impaired Presentation: Cup;Self Fed (with HOH assist) Oral Phase Impairments: Reduced labial seal Oral Phase Functional Implications: Right anterior spillage Pharyngeal  Phase Impairments: Suspected delayed Swallow;Decreased hyoid-laryngeal movement;Throat Clearing - Immediate;Cough - Delayed    Nectar Thick Nectar Thick Liquid: Not tested   Honey Thick Honey Thick Liquid: Not tested   Puree Puree: Impaired Presentation: Self Fed;Spoon (with HOH assist) Pharyngeal Phase Impairments: Decreased hyoid-laryngeal movement;Throat Clearing - Delayed   Solid   GO   Leonia Heatherly MA, CCC-SLP [phone]  Solid: Not tested       Karan Ramnauth Meryl 03/11/2012,10:35 AM

## 2012-03-11 NOTE — Progress Notes (Signed)
VASCULAR LAB PRELIMINARY  PRELIMINARY  PRELIMINARY  PRELIMINARY  Carotid duplex  completed.    Preliminary report:  Bilateral:  No evidence of hemodynamically significant internal carotid artery stenosis.   Vertebral artery flow is antegrade.      Kila Godina, RVT 03/11/2012, 9:24 AM

## 2012-03-11 NOTE — Progress Notes (Signed)
Stroke Team Progress Note  HISTORY  Julie Frank is an 78 y.o. female with a previous history of stroke and TIA, who was last seen normal by family at 11:30a 03/10/2012. Family returned after lunch and found patient nonverbal, right sided hemiplegia at 12:15p. Code stroke was called and patient was brought to [facility]. On arrival patient showed a left gaze deviation, right UE plegia and intermittent movement of left leg. Patient was mute. Patient has a history of chronic atrial fibrillation. She has not been on anticoagulation therapy. She's been taking aspirin daily. Patient was found to be a TPA candidate. She was admitted to the neuro ICU for further evaluation and treatment.  SUBJECTIVE Her  Family is not   at the bedside.  Overall   her condition is  much improved. Blood pressure has remained stable overnight. No neurological worsening noted overnight.  OBJECTIVE Most recent Vital Signs: Filed Vitals:   03/11/12 0500 03/11/12 0600 03/11/12 0700 03/11/12 0800  BP: 129/77 109/39 112/86 128/44  Pulse: 82   35  Temp:   97.8 F (36.6 C)   TempSrc:   Axillary   Resp: 16 17 18 21   Height:      Weight:      SpO2: 98% 97% 98% 99%   CBG (last 3)   Basename 03/10/12 1350  GLUCAP 103*    IV Fluid Intake:     . sodium chloride 75 mL/hr at 03/11/12 0700    MEDICATIONS    . pantoprazole (PROTONIX) IV  40 mg Intravenous QHS   PRN:  acetaminophen, acetaminophen, labetalol, ondansetron (ZOFRAN) IV, senna-docusate  Diet:  NPO  Activity:  Bedrest DVT Prophylaxis:  SCDs   CLINICALLY SIGNIFICANT STUDIES Basic Metabolic Panel:  Lab 03/10/12 4098 03/10/12 1335  NA 139 136  K 5.0 4.9  CL 105 101  CO2 -- 24  GLUCOSE 110* 113*  BUN 28* 22  CREATININE 1.00 0.97  CALCIUM -- 9.4  MG -- --  PHOS -- --   Liver Function Tests:  Lab 03/10/12 1335  AST 29  ALT 12  ALKPHOS 63  BILITOT 0.2*  PROT 6.9  ALBUMIN 3.3*   CBC:  Lab 03/10/12 1347 03/10/12 1335  WBC -- 7.8   NEUTROABS -- 5.1  HGB 15.6* 14.6  HCT 46.0 45.1  MCV -- 89.1  PLT -- 237   Coagulation:  Lab 03/10/12 1324  LABPROT 15.2  INR 1.22   Cardiac Enzymes:  Lab 03/10/12 1335  CKTOTAL --  CKMB --  CKMBINDEX --  TROPONINI <0.30   Urinalysis: No results found for this basename: COLORURINE:2,APPERANCEUR:2,LABSPEC:2,PHURINE:2,GLUCOSEU:2,HGBUR:2,BILIRUBINUR:2,KETONESUR:2,PROTEINUR:2,UROBILINOGEN:2,NITRITE:2,LEUKOCYTESUR:2 in the last 168 hours Lipid Panel    Component Value Date/Time   CHOL 186 03/11/2012 0400   TRIG 153* 03/11/2012 0400   HDL 43 03/11/2012 0400   CHOLHDL 4.3 03/11/2012 0400   VLDL 31 03/11/2012 0400   LDLCALC 112* 03/11/2012 0400   HgbA1C  Lab Results  Component Value Date   HGBA1C 5.6 03/27/2011    Urine Drug Screen:     Component Value Date/Time   LABOPIA NONE DETECTED 03/26/2011 2359   COCAINSCRNUR NONE DETECTED 03/26/2011 2359   LABBENZ NONE DETECTED 03/26/2011 2359   AMPHETMU NONE DETECTED 03/26/2011 2359   THCU NONE DETECTED 03/26/2011 2359   LABBARB NONE DETECTED 03/26/2011 2359    Alcohol Level: No results found for this basename: ETH:2 in the last 168 hours  CT of the brain  03/10/2012   1.  No acute finding. 2.  Atrophy and chronic microvascular ischemic change  MRI of the brain  pending  MRA of the brain  pending  2D Echocardiogram  pending  Carotid Doppler  no hemodynamically significant stenosis bilaterally.  CXR  03/10/2012  Enlargement of cardiac silhouette. Small left pleural effusion with atelectasis versus consolidation in left lower lobe.     EKG  atrial fibrillation, rate 64.   Therapy Recommendations  pending  Physical Exam   Frail elderly lady currently not in distressAwake alert. Afebrile. Head is nontraumatic. Neck is supple without bruit. Hearing is diminished.. Cardiac exam no murmur or gallop. Lungs are clear to auscultation. Distal pulses are well felt.  Neurological Exam : Awake alert globally aphasic expressive greater than  receptive. Makes a few guttural sounds and occasional words but cannot speak sentences. Able to follow only occasional simple midline commands. Blinks to threat bilaterally. Follows gaze in all directions. Fundi were not visualized. Visual acuity and fields are difficult to test No mild by facial weakness. No upper or lower eczema to drift. Cooperation is limited for detailed muscle testing but I do not see any significant focal weakness. Sensation and coordination are difficult to test. Plantars are downgoing. Gait was not tested. ASSESSMENT Julie Frank is a 78 y.o. female presenting with right hemiplegia, left gaze deviation, mutism. Status post IV t-PA 03/10/2012 at 1404. Imaging pending Infarct felt to be embolic secondary to known atrial fibrillation. Workup underway. On aspirin 81 mg orally every day prior to admission. Now on no antiplatelets as within 24h of tpa for secondary stroke prevention. Patient with resultant  global aphasia with resolved right hemiparesis.Marland Kitchen  atrial fibrillation, not on coumadin PTA secondary to repeated falls Hypertension Hyperlipidemia, LDL 112, on statin PTAnow, goal LDL < 100 PVD Vascular dementia Stroke 03/2006, 04/2009 [facility] day # 1  TREATMENT/PLAN  Continue aspirin after checking head CT post TPA for secondary stroke prevention.  Statin for elevated LDL  Check echocardiogram,HbA1c.  Physical, occupational and speech therapy consults.  Mobilize out of bed. This patient is critically ill and at significant risk of neurological worsening, death and care requires constant monitoring of vital signs, hemodynamics,respiratory and cardiac monitoring,review of multiple databases, neurological assessment, discussion with family, other specialists and medical decision making of high complexity. I spent 30 minutes of neurocritical care time  in the care of  this patient.      I have personally obtained a history, examined the patient,  evaluated imaging results, and formulated the assessment and plan of care. I agree with the above.  Delia Heady, MD Medical Director [facility] Pager: [phone] 03/11/2012 9:45 AM

## 2012-03-11 NOTE — Progress Notes (Addendum)
PT/OT Cancellation Note  Patient Details Name: Julie Frank MRN: [id number] DOB: 1909-10-02   Cancelled Treatment:     Patient not medically ready (on bedrest, will need updated activity orders to initiate therapy). Thank you. [place] Chard, OTR/L Pager: [phone] 03/11/2012    Jacquel Mccamish 03/11/2012, 9:01 AM

## 2012-03-12 ENCOUNTER — Telehealth: Payer: Self-pay | Admitting: Cardiology

## 2012-03-12 MED ORDER — ASPIRIN 325 MG PO TABS
325.0000 mg | ORAL_TABLET | Freq: Every day | ORAL | Status: DC
  Administered 2012-03-12 – 2012-03-13 (×2): 325 mg via ORAL
  Filled 2012-03-12 (×2): qty 1

## 2012-03-12 MED ORDER — SULFAMETHOXAZOLE-TMP DS 800-160 MG PO TABS
1.0000 | ORAL_TABLET | Freq: Every day | ORAL | Status: DC
  Administered 2012-03-12 – 2012-03-13 (×2): 1 via ORAL
  Filled 2012-03-12 (×2): qty 1

## 2012-03-12 MED ORDER — ADULT MULTIVITAMIN W/MINERALS CH
1.0000 | ORAL_TABLET | Freq: Every day | ORAL | Status: DC
  Administered 2012-03-12 – 2012-03-13 (×2): 1 via ORAL
  Filled 2012-03-12 (×2): qty 1

## 2012-03-12 MED ORDER — PANTOPRAZOLE SODIUM 40 MG PO TBEC
40.0000 mg | DELAYED_RELEASE_TABLET | Freq: Every day | ORAL | Status: DC
  Administered 2012-03-12 – 2012-03-13 (×2): 40 mg via ORAL
  Filled 2012-03-12 (×2): qty 1

## 2012-03-12 MED ORDER — METOPROLOL SUCCINATE ER 25 MG PO TB24
25.0000 mg | ORAL_TABLET | Freq: Every day | ORAL | Status: DC
  Administered 2012-03-12 – 2012-03-13 (×2): 25 mg via ORAL
  Filled 2012-03-12 (×2): qty 1

## 2012-03-12 MED ORDER — ATORVASTATIN CALCIUM 10 MG PO TABS
10.0000 mg | ORAL_TABLET | Freq: Every day | ORAL | Status: DC
  Administered 2012-03-12: 10 mg via ORAL
  Filled 2012-03-12 (×2): qty 1

## 2012-03-12 MED ORDER — LEVOTHYROXINE SODIUM 25 MCG PO TABS
25.0000 ug | ORAL_TABLET | Freq: Every day | ORAL | Status: DC
  Administered 2012-03-12 – 2012-03-13 (×2): 25 ug via ORAL
  Filled 2012-03-12 (×4): qty 1

## 2012-03-12 MED ORDER — POLYETHYLENE GLYCOL 3350 17 G PO PACK
17.0000 g | PACK | ORAL | Status: DC
  Administered 2012-03-12: 17 g via ORAL
  Filled 2012-03-12: qty 1

## 2012-03-12 MED ORDER — ONE-DAILY MULTI VITAMINS PO TABS: 1.0000 | ORAL_TABLET | Freq: Every day | ORAL | Status: DC

## 2012-03-12 NOTE — Progress Notes (Signed)
Speech Language Pathology Treatment: Aphasia/Dysphagia Patient Details Name: LILAS DIEFENDORF MRN: [id number] DOB: 1910/02/18 Today's Date: 03/12/2012 Time: 1445- 1519    Assessment / Plan / Recommendation Clinical Impression  Patient alert with improving communication. Patient with increased spontaneous verbal utterances today, mostly automatic speech in type however also able to name common objects related to functional ADLs during self feeding task with 75% accuracy. Following basic 1-step commands with minimal contextual cues today. Patient able to sustain attention to self feeding task with SLP supervision, utilizing compensatory strategies and aspiration precautions including small bites and sips with min verbal cues and use of spoon for liquids with mod-max verbal and demonstration cues. Overall, no overt s/s of aspiration noted. Current diet continues to remain appropriate. Hopeful for continued improvement in both communication and swallowing given great improvement over the past 24 hours. SLP will continue to f/u.     SLP Plan  Continue with current plan of care       SLP Goals  SLP Goals Potential to Achieve Goals: Good Potential Considerations: Severity of impairments Progress/Goals/Alternative treatment plan discussed with pt/caregiver and they: Agree SLP Goal #1: Patient will answer basic biographical Y/N question to make basic needs known with moderate clinician cues SLP Goal #1 - Progress: Progressing toward goal SLP Goal #2: Patient will follow basic 1 step directions during functional ADLs with moderate visual cues SLP Goal #2 - Progress: Progressing toward goal   Swallowing Goals  SLP Swallowing Goals Patient will utilize recommended strategies during swallow to increase swallowing safety with: Moderate assistance Swallow Study Goal #2 - Progress: Revised (modified due to lack of progress/goal met)   General Temperature Spikes Noted: No Respiratory Status: Room  air Behavior/Cognition: Alert;Cooperative;Pleasant mood Oral Cavity - Dentition: Adequate natural dentition Patient Positioning: Upright in bed       Treatment Treatment focused on: Aphasia;Cognition (dysphagia) Skilled Treatment: Patient alert with improving communication. Patient with increased spontaneous verbal utterances today, mostly automatic speech in type however also able to name common objects related to functional ADLs during self feeding task with 75% accuracy. Following basic 1-step commands with minimal contextual cues today. Patient able to sustain attention to self feeding task with SLP supervision, utilizing compensatory strategies and aspiration precautions including small bites and sips with min verbal cues and use of spoon for liquids with mod-max verbal and demonstration cues. Overall, no overt s/s of aspiration noted. Current diet continues to remain appropriate. Hopeful for continued improvement in both communication and swallowing given great improvement over the past 24 hours. SLP will continue to f/u.    GO   Ferdinand Lango MA, CCC-SLP [phone]   Naileah Karg Meryl 03/12/2012, 3:21 PM

## 2012-03-12 NOTE — Telephone Encounter (Signed)
New problem:   Patient took the medication well at [facility]. Please call to discuss.

## 2012-03-12 NOTE — Evaluation (Signed)
Physical Therapy Evaluation Patient Details Name: Julie Frank MRN: [id number] DOB: 1909/12/20 Today's Date: 03/12/2012 Time: 4098-1191 PT Time Calculation (min): 26 min  PT Assessment / Plan / Recommendation Clinical Impression  Pt is 78 y/o female admitted for previous history of stroke and TIA, who was last seen normal by family at 11:30a 03/10/2012. Family returned after lunch and found patient nonverbal, right sided hemiplegia at 12:15p.  MRI found left MCA infarct.  Pt bilateral LE purposeful movement however unable to fully assess due to cognition and expressive aphasia.  Pt will benefit from acute PT services to improve overall mobilty and prepare for safe d/c to next venue.    PT Assessment  Patient needs continued PT services    Follow Up Recommendations  SNF    Does the patient have the potential to tolerate intense rehabilitation      Barriers to Discharge        Equipment Recommendations  None recommended by PT    Recommendations for Other Services     Frequency Min 3X/week    Precautions / Restrictions Precautions Precautions: Fall Restrictions Weight Bearing Restrictions: No   Pertinent Vitals/Pain No c/o pain      Mobility  Bed Mobility Bed Mobility: Supine to Sit;Sitting - Scoot to Edge of Bed Supine to Sit: 1: +2 Total assist Supine to Sit: Patient Percentage: 40% Sitting - Scoot to Edge of Bed: 1: +1 Total assist Details for Bed Mobility Assistance: assist to initiate and to come into upright sitting, pt with strong posterior lean during bed mobility. Reciprocal scooting with pad Transfers Transfers: Heritage manager Transfers: 1: +2 Total assist Squat Pivot Transfers: Patient Percentage: 20% Details for Transfer Assistance: unable to achieve upright standing; (A) to transfer hips to recliner Ambulation/Gait Ambulation/Gait Assistance: Not tested (comment)    Shoulder Instructions     Exercises     PT Diagnosis:  Generalized weakness;Hemiplegia dominant side  PT Problem List: Decreased strength;Decreased activity tolerance;Decreased balance;Decreased mobility;Decreased knowledge of use of DME;Decreased cognition PT Treatment Interventions: Functional mobility training;Therapeutic activities;Therapeutic exercise;Balance training;Neuromuscular re-education;Patient/family education   PT Goals Acute Rehab PT Goals PT Goal Formulation: Patient unable to participate in goal setting Time For Goal Achievement: 03/26/12 Potential to Achieve Goals: Fair Pt will go Supine/Side to Sit: with min assist PT Goal: Supine/Side to Sit - Progress: Goal set today Pt will Sit at Edge of Bed: with supervision;3-5 min PT Goal: Sit at Edge Of Bed - Progress: Goal set today Pt will go Sit to Supine/Side: with min assist PT Goal: Sit to Supine/Side - Progress: Goal set today Pt will go Sit to Stand: with mod assist PT Goal: Sit to Stand - Progress: Goal set today Pt will go Stand to Sit: with mod assist PT Goal: Stand to Sit - Progress: Goal set today Pt will Transfer Bed to Chair/Chair to Bed: with mod assist PT Transfer Goal: Bed to Chair/Chair to Bed - Progress: Goal set today  Visit Information  Last PT Received On: 03/12/12 Assistance Needed: +2 PT/OT Co-Evaluation/Treatment: Yes    Subjective Data      Prior Functioning  Home Living Available Help at Discharge: Skilled Nursing Facility Type of Home: Skilled Nursing Facility Prior Function Level of Independence: Needs assistance Needs Assistance: Bathing;Dressing;Feeding;Grooming;Toileting Bath: Maximal Dressing: Maximal Feeding: Unable to assess Grooming: Maximal Toileting: Total Able to Take Stairs?: No Driving: No Vocation: Retired Comments: per report from [employer], pt was Max/Total A stand pivot transfers to/from w/c Communication Communication: Expressive  difficulties;HOH Dominant Hand:  (unknown)    Cognition  Arousal/Alertness:  Awake/alert Orientation Level: Disoriented to;Time;Place;Situation Behavior During Session: WFL for tasks performed Cognition - Other Comments: expressive aphasia therefore, difficult to fully assess.     Extremity/Trunk Assessment Right Upper Extremity Assessment RUE ROM/Strength/Tone: Unable to fully assess;Due to impaired cognition Left Upper Extremity Assessment LUE ROM/Strength/Tone: Unable to fully assess;Due to impaired cognition Right Lower Extremity Assessment RLE ROM/Strength/Tone: Unable to fully assess;Due to impaired cognition (However noticeable decrease knee extension) Left Lower Extremity Assessment LLE ROM/Strength/Tone: Unable to fully assess;Due to impaired cognition (However noticeable decrease knee extension) Trunk Assessment Trunk Assessment: Kyphotic   Balance Balance Balance Assessed: Yes Static Sitting Balance Static Sitting - Balance Support: Feet supported Static Sitting - Level of Assistance: 3: Mod assist;5: Stand by assistance Static Sitting - Comment/# of Minutes: Initial mod (A) to maintain balance due to posterior lean.  Pt with kyphotic and posterior tilt fixed posture.  Pt able to progress to minguard (A) however leans to right side.  End of Session PT - End of Session Equipment Utilized During Treatment: Gait belt Activity Tolerance: Patient tolerated treatment well Patient left: in bed;with call bell/phone within reach Nurse Communication: Mobility status  GP     Breauna Mazzeo 03/12/2012, 10:08 AM Jake Shark, PT DPT [phone]

## 2012-03-12 NOTE — Evaluation (Signed)
Occupational Therapy Evaluation Patient Details Name: Julie Frank MRN: [id number] DOB: 01-28-1910 Today's Date: 03/12/2012 Time: 4098-1191 OT Time Calculation (min): 26 min  OT Assessment / Plan / Recommendation Clinical Impression  Julie Frank is an 78 y.o. female with a previous history of stroke and TIA. Pt admitted with right hemiplegia, left gaze deviation, mutism. Status post IV t-PA 03/10/2012 at 1404. Imaging shows patch left MCA branch Infarcts felt to be embolic secondary to known atrial fibrillation. Pt will benefit from skilled OT in the acute setting to maximize I with ADL and ADL mobility prior to d/c. Recommend return to SNF at d/c.    OT Assessment  Patient needs continued OT Services    Follow Up Recommendations  SNF    Barriers to Discharge      Equipment Recommendations  None recommended by OT    Recommendations for Other Services    Frequency  Min 2X/week    Precautions / Restrictions Precautions Precautions: Fall Restrictions Weight Bearing Restrictions: No   Pertinent Vitals/Pain Pt reports pain when touching left foot and left fingertip. Did not rate.    ADL  Grooming: Moderate assistance Where Assessed - Grooming: Supported sitting Upper Body Bathing: Moderate assistance Where Assessed - Upper Body Bathing: Supported sitting Lower Body Bathing: +1 Total assistance Where Assessed - Lower Body Bathing: Supported sit to stand Upper Body Dressing: Maximal assistance Where Assessed - Upper Body Dressing: Supported sitting Lower Body Dressing: +1 Total assistance Where Assessed - Lower Body Dressing: Supported sit to Pharmacist, hospital: +2 Total assistance Toilet Transfer: Patient Percentage: 20% Statistician Method: Ambulance person:  (bed to chair) Toileting - Clothing Manipulation and Hygiene: +2 Total assistance Where Assessed - Toileting Clothing Manipulation and Hygiene: Sit to stand from 3-in-1 or  toilet Equipment Used: Gait belt Transfers/Ambulation Related to ADLs: +2totalA(pt=20%) squat pivot transfer bed to chair    OT Diagnosis: Generalized weakness;Disturbance of vision;Cognitive deficits;Paresis  OT Problem List: Decreased activity tolerance;Impaired balance (sitting and/or standing);Impaired vision/perception;Decreased coordination;Decreased cognition;Decreased safety awareness;Decreased knowledge of use of DME or AE;Decreased knowledge of precautions;Impaired UE functional use OT Treatment Interventions: Self-care/ADL training;DME and/or AE instruction;Therapeutic activities;Cognitive remediation/compensation;Visual/perceptual remediation/compensation;Patient/family education;Balance training   OT Goals Acute Rehab OT Goals OT Goal Formulation: With patient Time For Goal Achievement: 03/26/12 Potential to Achieve Goals: Good ADL Goals Pt Will Perform Eating: with set-up;with min assist;Sitting, chair;Sitting, edge of bed ADL Goal: Eating - Progress: Goal set today Pt Will Perform Grooming: with mod assist;Standing at sink ADL Goal: Grooming - Progress: Goal set today Pt Will Perform Upper Body Dressing: with mod assist;Sitting, bed;Sitting, chair ADL Goal: Upper Body Dressing - Progress: Goal set today Pt Will Transfer to Toilet: with mod assist;Squat pivot transfer;with DME ADL Goal: Toilet Transfer - Progress: Goal set today Pt Will Perform Toileting - Clothing Manipulation: with max assist ADL Goal: Toileting - Clothing Manipulation - Progress: Goal set today  Visit Information  Last OT Received On: 03/12/12 Assistance Needed: +2 PT/OT Co-Evaluation/Treatment: Yes    Subjective Data  Subjective: "You hands are cold!" Patient Stated Goal: None stated   Prior Functioning     Home Living Available Help at Discharge: Skilled Nursing Facility Type of Home: Skilled Nursing Facility Prior Function Level of Independence: Needs assistance Needs Assistance:  Bathing;Dressing;Feeding;Grooming;Toileting Bath: Maximal Dressing: Maximal Feeding: Unable to assess Grooming: Maximal Toileting: Total Able to Take Stairs?: No Driving: No Vocation: Retired Comments: per report from [employer], pt was Max/Total A stand pivot transfers to/from  w/c Communication Communication: Expressive difficulties;HOH Dominant Hand:  (unknown)         Vision/Perception Vision - Assessment Additional Comments: unable to fully assess due to aphasia. however, pt with left gaze preference. she does appear to be able to cross midline in both directions. No double vision.   Cognition  Arousal/Alertness: Awake/alert Orientation Level: Disoriented to;Time;Place;Situation Behavior During Session: WFL for tasks performed Cognition - Other Comments: expressive aphasia therefore, difficult to fully assess.     Extremity/Trunk Assessment Right Upper Extremity Assessment RUE ROM/Strength/Tone: Unable to fully assess;Due to impaired cognition Left Upper Extremity Assessment LUE ROM/Strength/Tone: Unable to fully assess;Due to impaired cognition Trunk Assessment Trunk Assessment: Kyphotic     Mobility Bed Mobility Bed Mobility: Supine to Sit;Sitting - Scoot to Edge of Bed Supine to Sit: 1: +2 Total assist Supine to Sit: Patient Percentage: 40% Sitting - Scoot to Edge of Bed: 1: +1 Total assist Details for Bed Mobility Assistance: assist to initiate and to come into upright sitting, pt with strong posterior lean during bed mobility. Reciprocal scooting with pad Transfers Details for Transfer Assistance: unable to achieve upright standing; (A) to transfer hips to recliner     Shoulder Instructions     Exercise     Balance Balance Balance Assessed: Yes Static Sitting Balance Static Sitting - Balance Support: Feet supported Static Sitting - Level of Assistance: 3: Mod assist;5: Stand by assistance Static Sitting - Comment/# of Minutes: Initial mod (A) to maintain  balance due to posterior lean.  Pt with kyphotic and posterior tilt fixed posture.  Pt able to progress to minguard (A) however leans to right side.   End of Session OT - End of Session Equipment Utilized During Treatment: Gait belt Activity Tolerance: Patient tolerated treatment well Patient left: in chair;with call bell/phone within reach Nurse Communication: Mobility status  GO     Johnhenry Tippin 03/12/2012, 10:02 AM

## 2012-03-12 NOTE — Progress Notes (Signed)
Stroke Team Progress Note  HISTORY  Julie Frank is an 78 y.o. female with a previous history of stroke and TIA, who was last seen normal by family at 11:30a 03/10/2012. Family returned after lunch and found patient nonverbal, right sided hemiplegia at 12:15p. Code stroke was called and patient was brought to [facility]. On arrival patient showed a left gaze deviation, right UE plegia and intermittent movement of left leg. Patient was mute. Patient has a history of chronic atrial fibrillation. She has not been on anticoagulation therapy. She's been taking aspirin daily. Patient was found to be a TPA candidate. She was admitted to the neuro ICU for further evaluation and treatment.  SUBJECTIVE Her  Family is not   at the bedside.  Overall   her condition is  much improved. Blood pressure has remained stable overnight. No neurological worsening noted overnight.  OBJECTIVE Most recent Vital Signs: Filed Vitals:   03/12/12 0600 03/12/12 0700 03/12/12 0801 03/12/12 0814  BP: 158/75 153/77  155/99  Pulse: 69 64  69  Temp:   97.5 F (36.4 C)   TempSrc:   Axillary   Resp: 14 16  20   Height:      Weight:      SpO2: 99% 99%  98%   CBG (last 3)   Basename 03/10/12 1350  GLUCAP 103*    IV Fluid Intake:      . sodium chloride 75 mL/hr at 03/12/12 0800    MEDICATIONS     . aspirin  325 mg Oral Daily  . pantoprazole (PROTONIX) IV  40 mg Intravenous QHS   PRN:  acetaminophen, acetaminophen, labetalol, ondansetron (ZOFRAN) IV, senna-docusate  Diet:  Dysphagia  Activity:  Bedrest DVT Prophylaxis:  SCDs   CLINICALLY SIGNIFICANT STUDIES Basic Metabolic Panel:   Lab 03/10/12 1347 03/10/12 1335  NA 139 136  K 5.0 4.9  CL 105 101  CO2 -- 24  GLUCOSE 110* 113*  BUN 28* 22  CREATININE 1.00 0.97  CALCIUM -- 9.4  MG -- --  PHOS -- --   Liver Function Tests:   Lab 03/10/12 1335  AST 29  ALT 12  ALKPHOS 63  BILITOT 0.2*  PROT 6.9  ALBUMIN 3.3*   CBC:   Lab  03/10/12 1347 03/10/12 1335  WBC -- 7.8  NEUTROABS -- 5.1  HGB 15.6* 14.6  HCT 46.0 45.1  MCV -- 89.1  PLT -- 237   Coagulation:   Lab 03/10/12 1324  LABPROT 15.2  INR 1.22   Cardiac Enzymes:   Lab 03/10/12 1335  CKTOTAL --  CKMB --  CKMBINDEX --  TROPONINI <0.30   Urinalysis: No results found for this basename: COLORURINE:2,APPERANCEUR:2,LABSPEC:2,PHURINE:2,GLUCOSEU:2,HGBUR:2,BILIRUBINUR:2,KETONESUR:2,PROTEINUR:2,UROBILINOGEN:2,NITRITE:2,LEUKOCYTESUR:2 in the last 168 hours Lipid Panel    Component Value Date/Time   CHOL 186 03/11/2012 0400   TRIG 153* 03/11/2012 0400   HDL 43 03/11/2012 0400   CHOLHDL 4.3 03/11/2012 0400   VLDL 31 03/11/2012 0400   LDLCALC 112* 03/11/2012 0400   HgbA1C  Lab Results  Component Value Date   HGBA1C 5.8* 03/11/2012    Urine Drug Screen:     Component Value Date/Time   LABOPIA NONE DETECTED 03/26/2011 2359   COCAINSCRNUR NONE DETECTED 03/26/2011 2359   LABBENZ NONE DETECTED 03/26/2011 2359   AMPHETMU NONE DETECTED 03/26/2011 2359   THCU NONE DETECTED 03/26/2011 2359   LABBARB NONE DETECTED 03/26/2011 2359    Alcohol Level: No results found for this basename: ETH:2 in the last 168 hours  CT of the brain  03/10/2012   1.  No acute finding. 2.  Atrophy and chronic microvascular ischemic change  MRI of the brain  Patchy left insular and frontal cortex small infarcts  MRA of the brain  Moderate left m1 and diffuse mild atherosclerosis 2D Echocardiogram  Left ventricle: The cavity size was normal. Wall thickness was increased in a pattern of severe LVH. Systolic function was normal. The estimated ejection fraction was in the range of 50% to 55%  Carotid Doppler  no hemodynamically significant stenosis bilaterally.  CXR  03/10/2012  Enlargement of cardiac silhouette. Small left pleural effusion with atelectasis versus consolidation in left lower lobe.     EKG  atrial fibrillation, rate 64.   Therapy Recommendations  pending  Physical  Exam   Frail elderly lady currently not in distressAwake alert. Afebrile. Head is nontraumatic. Neck is supple without bruit. Hearing is diminished.. Cardiac exam no murmur or gallop. Lungs are clear to auscultation. Distal pulses are well felt.  Neurological Exam : Awake alert nonfluent speech with mild aphasia expressive greater than receptive can speak short sentences. Able to follow  simple midline commands. Blinks to threat bilaterally. Follows gaze in all directions. Fundi were not visualized. Visual acuity and fields are difficult to test No mild by facial weakness. No upper or lower eczema to drift. Cooperation is limited for detailed muscle testing but I do not see any significant focal weakness. Sensation and coordination are difficult to test. Plantars are downgoing. Gait was not tested. ASSESSMENT [name] is a 78 y.o. female presenting with right hemiplegia, left gaze deviation, mutism. Status post IV t-PA 03/10/2012 at 1404. Imaging shows patch left MCA branch Infarcts felt to be embolic secondary to known atrial fibrillation. Workup underway. On aspirin 81 mg orally every day prior to admission. Now on no antiplatelets as within 24h of tpa for secondary stroke prevention. Patient with resultant  global aphasia with resolved right hemiparesis.Marland Kitchen  atrial fibrillation, not on coumadin PTA secondary to repeated falls Hypertension Hyperlipidemia, LDL 112, on statin PTAnow, goal LDL < 100 PVD Vascular dementia Stroke 03/2006, 04/2009 [facility] day # 2  TREATMENT/PLAN  Continue aspirin   for secondary stroke prevention. As not anticoagulation candidate given age, fall risk  Statin for elevated LDL  Transfer to floor.  Physical, occupational and speech therapy consults.  Mobilize out of bed.  D/w daughter       Delia Heady, MD Medical Director [facility] Pager: [phone] 03/12/2012 9:25 AM

## 2012-03-12 NOTE — Telephone Encounter (Signed)
Spoke to patient's daughter Britta Mccreedy she wanted Dr.Jordan to know her mother continues to improve and she will be discharged back to [facility] tomorrow 03/13/12.She wants to know if her mother should start back lasix.Daughter was told at patient's last office visit Dr.Jordan did not advise restarting lasix due to profound weight loss.Daughter stated patient was not weighed accurately at that visit and she presently weighs 107 lbs.Advised will check with Dr.Jordan 03/13/12 and call her back.

## 2012-03-13 LAB — URINALYSIS, ROUTINE W REFLEX MICROSCOPIC
Glucose, UA: NEGATIVE mg/dL
Hgb urine dipstick: NEGATIVE
Ketones, ur: NEGATIVE mg/dL
Protein, ur: NEGATIVE mg/dL

## 2012-03-13 LAB — URINE MICROSCOPIC-ADD ON

## 2012-03-13 MED ORDER — ATORVASTATIN CALCIUM 10 MG PO TABS: 10.0000 mg | ORAL_TABLET | Freq: Every day | ORAL | Status: DC

## 2012-03-13 NOTE — Telephone Encounter (Signed)
Patient's daughter called no answer.Unable to leave message.

## 2012-03-13 NOTE — Discharge Summary (Signed)
Stroke Discharge Summary  Patient ID: Julie Frank   MRN: [id number]      DOB: 1909/09/19  Date of Admission: 03/10/2012 Date of Discharge: 03/13/2012  Attending Physician:  Darcella Cheshire, MD, Stroke MD  Consulting Physician(s):     None  Patient's PCP:  Kimber Relic, MD  Discharge Diagnoses:  Principal Problem:  *CVA (cerebrovascular accident) Active Problems:  Atrial fibrillation  Hypertension  BMI: Body mass index is 20.49 kg/(m^2).  Past Medical History  Diagnosis Date  . Mitral insufficiency     CHRONIC  . Hypertension   . Debility     GENERALIZED  . Hyperlipidemia   . OA (osteoarthritis)   . OP (osteoporosis)   . CHF (congestive heart failure)   . History of diastolic dysfunction   . Muscle weakness (generalized)   . Difficulty walking   . Metabolic encephalopathy   . Hypothyroidism   . HOH (hard of hearing)     "extremely"  . SVT (supraventricular tachycardia)     nonsustained  . Heart murmur   . Repeated falls 2010    "3 serious falls; hit head each time; staples 1st 2 times"  . Vascular dementia dx'd 2011  . Peripheral vascular disease   . Dry cough 03/26/11    "chronic; dx'd years ago as allergy related type of thing"  . Bruises easily   . Chronic back pain greater than 3 months duration   . CVA (cerebrovascular accident) 03/2006; 04/2009    residual "speech problems & short term memory decreased"  . PNA (pneumonia)   . UTI (lower urinary tract infection)    Past Surgical History  Procedure Date  . Removal colon polpys 12/2000    Villous adenoma of the cecum.  . Tonsillectomy and adenoidectomy   . Appendectomy   . Cataract extraction w/ intraocular lens  implant, bilateral 1984  . Dilation and curettage of uterus 1960's    "several"      Medication List     As of 03/13/2012  2:10 PM    STOP taking these medications         multivitamin tablet      NON FORMULARY      TYLENOL ARTHRITIS PAIN 650 MG CR tablet   Generic  drug: acetaminophen      TAKE these medications         aspirin 325 MG tablet   Take 1 tablet (325 mg total) by mouth daily.      atorvastatin 10 MG tablet   Commonly known as: LIPITOR   Take 1 tablet (10 mg total) by mouth daily at 6 PM.      CALCIUM 600 + D PO   Take 1 tablet by mouth 2 (two) times daily.      levothyroxine 25 MCG tablet   Commonly known as: SYNTHROID, LEVOTHROID   Take 25 mcg by mouth daily.      metoprolol succinate 25 MG 24 hr tablet   Commonly known as: TOPROL-XL   Take 25 mg by mouth daily.      omeprazole 20 MG capsule   Commonly known as: PRILOSEC   Take 20 mg by mouth daily.      polyethylene glycol packet   Commonly known as: MIRALAX / GLYCOLAX   Take 17 g by mouth every other day. As needed for constipation.      sulfamethoxazole-trimethoprim 800-160 MG per tablet   Commonly known as: BACTRIM DS   Take 1  tablet by mouth daily. Take one tablet by mouth daily for 10 days.      ASK your doctor about these medications         Cranberry 425 MG Caps   Take 1 capsule by mouth 2 (two) times daily.        LABORATORY STUDIES CBC    Component Value Date/Time   WBC 7.8 03/10/2012 1335   RBC 5.06 03/10/2012 1335   HGB 15.6* 03/10/2012 1347   HCT 46.0 03/10/2012 1347   PLT 237 03/10/2012 1335   MCV 89.1 03/10/2012 1335   MCH 28.9 03/10/2012 1335   MCHC 32.4 03/10/2012 1335   RDW 16.5* 03/10/2012 1335   LYMPHSABS 1.8 03/10/2012 1335   MONOABS 0.7 03/10/2012 1335   EOSABS 0.1 03/10/2012 1335   BASOSABS 0.0 03/10/2012 1335   CMP    Component Value Date/Time   NA 139 03/10/2012 1347   K 5.0 03/10/2012 1347   CL 105 03/10/2012 1347   CO2 24 03/10/2012 1335   GLUCOSE 110* 03/10/2012 1347   BUN 28* 03/10/2012 1347   CREATININE 1.00 03/10/2012 1347   CALCIUM 9.4 03/10/2012 1335   PROT 6.9 03/10/2012 1335   ALBUMIN 3.3* 03/10/2012 1335   AST 29 03/10/2012 1335   ALT 12 03/10/2012 1335   ALKPHOS 63 03/10/2012 1335   BILITOT 0.2*  03/10/2012 1335   GFRNONAA 46* 03/10/2012 1335   GFRAA 54* 03/10/2012 1335   COAGS Lab Results  Component Value Date   INR 1.22 03/10/2012   INR 1.07 07/21/2009   INR 1.10 05/01/2009   Lipid Panel    Component Value Date/Time   CHOL 186 03/11/2012 0400   TRIG 153* 03/11/2012 0400   HDL 43 03/11/2012 0400   CHOLHDL 4.3 03/11/2012 0400   VLDL 31 03/11/2012 0400   LDLCALC 112* 03/11/2012 0400   HgbA1C  Lab Results  Component Value Date   HGBA1C 5.8* 03/11/2012   Cardiac Panel (last 3 results) No results found for this basename: CKTOTAL:3,CKMB:3,TROPONINI:3,RELINDX:3 in the last 72 hours Urinalysis    Component Value Date/Time   COLORURINE YELLOW 03/13/2012 1306   APPEARANCEUR CLOUDY* 03/13/2012 1306   LABSPEC 1.019 03/13/2012 1306   PHURINE 5.0 03/13/2012 1306   GLUCOSEU NEGATIVE 03/13/2012 1306   HGBUR NEGATIVE 03/13/2012 1306   BILIRUBINUR NEGATIVE 03/13/2012 1306   KETONESUR NEGATIVE 03/13/2012 1306   PROTEINUR NEGATIVE 03/13/2012 1306   UROBILINOGEN 0.2 03/13/2012 1306   NITRITE NEGATIVE 03/13/2012 1306   LEUKOCYTESUR MODERATE* 03/13/2012 1306   Urine Drug Screen     Component Value Date/Time   LABOPIA NONE DETECTED 03/26/2011 2359   COCAINSCRNUR NONE DETECTED 03/26/2011 2359   LABBENZ NONE DETECTED 03/26/2011 2359   AMPHETMU NONE DETECTED 03/26/2011 2359   THCU NONE DETECTED 03/26/2011 2359   LABBARB NONE DETECTED 03/26/2011 2359    SIGNIFICANT DIAGNOSTIC STUDIES  CT of the brain 03/10/2012 1. No acute finding. 2. Atrophy and chronic microvascular ischemic change   MRI of the brain Patchy left insular and frontal cortex small infarcts   MRA of the brain Moderate left m1 and diffuse mild atherosclerosis   2D Echocardiogram Left ventricle: The cavity size was normal. Wall thickness was increased in a pattern of severe LVH. Systolic function was normal. The estimated ejection fraction was in the range of 50% to 55%   Carotid Doppler no hemodynamically significant  stenosis bilaterally.   CXR 03/10/2012 Enlargement of cardiac silhouette. Small left pleural effusion with atelectasis versus consolidation in left lower  lobe.   EKG atrial fibrillation, rate 64.    History of Present Illness    Julie Frank is an 78 y.o. female with a previous history of stroke and TIA, who was last seen normal by family at 11:30a 03/10/2012. Family returned after lunch and found patient nonverbal, right sided hemiplegia at 12:15p. Code stroke was called and patient was brought to [facility]. On arrival patient showed a left gaze deviation, right UE plegia and intermittent movement of left leg. Patient was mute. Patient has a history of chronic atrial fibrillation. She has not been on anticoagulation therapy. She's been taking aspirin daily. Upon further questioning, the patient was found to be taking a baby aspirin 81mg  daily. Patient was found to be a TPA candidate. She was admitted to the neuro ICU for further evaluation and treatment  Hospital Course  Ms. HARLYN RATHMANN is a 78 y.o. female presenting with right hemiplegia, left gaze deviation, mutism. Status post IV t-PA 03/10/2012 at 1404. Imaging shows patch left MCA branch Infarcts felt to be embolic secondary to known atrial fibrillation. Workup underway. On aspirin 81 mg orally every day prior to admission. Now on no antiplatelets as within 24h of tpa , but did start Aspirin 325mg  daily after MRI cleared post tPA for secondary stroke prevention. Patient with resultant global aphasia with resolved right hemiparesis.Marland Kitchen  atrial fibrillation, not on coumadin PTA secondary to repeated falls  Hypertension  Hyperlipidemia, LDL 112, goal LDL < 100, on lipitor  PVD  Vascular dementia  Stroke 03/2006, 04/2009  [facility]  TREATMENT/PLAN  Continue aspirin for secondary stroke prevention. As not anticoagulation candidate given age, fall risk  SNF recommended  Risk factor management  Patient to transfer to SNF today.   Dr. Pearlean Brownie discussed plan with daughter. The patient is on long term antibiotic therapy for chronic UTI. As per daughter request, we obtained a urine sample and will send off for culture with documentation that the patient is on above antibiotic. This will need to be reviewed or restudied by accepting facility on Sep 04, 2022 03/16/2012.   Patient with continued stroke symptoms of aphasia. Physical therapy, occupational therapy and speech therapy evaluated patient. They recommend SNF  Discharge Exam  Blood pressure 141/81, pulse 72, temperature 98 F (36.7 C), temperature source Oral, resp. rate 18, height 5' (1.524 m), weight 47.6 kg (104 lb 15 oz), SpO2 100.00%.   Physical Exam  Frail elderly lady currently not in distressAwake alert. Afebrile. Head is nontraumatic. Neck is supple without bruit. Hearing is diminished.. Cardiac exam no murmur or gallop. Lungs are clear to auscultation. Distal pulses are well felt.  Neurological Exam : Awake alert nonfluent speech with mild aphasia expressive greater than receptive can speak short sentences. Able to follow simple midline commands. Blinks to threat bilaterally. Follows gaze in all directions. Fundi were not visualized. Visual acuity and fields are difficult to test No mild by facial weakness. No upper or lower eczema to drift. Cooperation is limited for detailed muscle testing but I do not see any significant focal weakness. Sensation and coordination are difficult to test. Plantars are downgoing. Gait was not tested.  Discharge Diet   Dysphagia  1 nectar thick  Discharge Plan    Disposition: SNF[facility]   Aspirin 325mg  daily  for secondary stroke prevention.  Ongoing risk factor control by Primary Care Physician.  Risk factor recommendations:  {Hypertension target range 130-140/70-80  Lipid range - LDL < 100 and checked every 6 months, fasting  Diabetes -  HgB A1C <7  Follow-up GREEN, Lenon Curt, MD in 1 month.  Follow-up with Dr. Delia Heady in 2 months.  45 minutes were spent preparing discharge. I have spent time as well reviewing the discharge with social work, nursing and patients daughter (I have tried to call daughter and have waited for her on the floor and have not successfully communicated directly with daughter. Nursing will call me if daughter wishes to speak to me).  Signed  Gwendolyn Lima. Manson Passey, [facility], MBA, MHA [facility] Pager: [phone] 03/13/2012 2:17 PM       I have personally examined this patient, reviewed pertinent data and developed the plan of care. I agree with above. Delia Heady, MD Medical Director [facility] Pager: [phone] 03/13/2012 6:10 PM

## 2012-03-13 NOTE — Progress Notes (Signed)
Stroke Team Progress Note  HISTORY  Julie Frank is an 78 y.o. female with a previous history of stroke and TIA, who was last seen normal by family at 11:30a 03/10/2012. Family returned after lunch and found patient nonverbal, right sided hemiplegia at 12:15p. Code stroke was called and patient was brought to [facility]. On arrival patient showed a left gaze deviation, right UE plegia and intermittent movement of left leg. Patient was mute. Patient has a history of chronic atrial fibrillation. She has not been on anticoagulation therapy. She's been taking aspirin daily. Patient was found to be a TPA candidate. She was admitted to the neuro ICU for further evaluation and treatment.  SUBJECTIVE  Patient sitting up in chair. Answers questions appropriately. No complaints.   OBJECTIVE Most recent Vital Signs: Filed Vitals:   03/12/12 1627 03/12/12 2211 03/13/12 0215 03/13/12 0632  BP: 131/50 147/69 146/74 147/77  Pulse: 48 73 69 72  Temp: 97.6 F (36.4 C) 97.9 F (36.6 C) 97.7 F (36.5 C) 96.4 F (35.8 C)  TempSrc: Oral Oral Oral Axillary  Resp: 24 17 16 16   Height:      Weight:      SpO2: 97% 95% 94% 95%   CBG (last 3)   Basename 03/10/12 1350  GLUCAP 103*    IV Fluid Intake:      . sodium chloride 75 mL/hr at 03/12/12 1200    MEDICATIONS     . aspirin  325 mg Oral Daily  . atorvastatin  10 mg Oral q1800  . levothyroxine  25 mcg Oral QAC breakfast  . metoprolol succinate  25 mg Oral Daily  . multivitamin with minerals  1 tablet Oral Daily  . pantoprazole  40 mg Oral Daily  . polyethylene glycol  17 g Oral QODAY  . sulfamethoxazole-trimethoprim  1 tablet Oral Daily   PRN:  acetaminophen, acetaminophen, labetalol, ondansetron (ZOFRAN) IV, senna-docusate  Diet:  Dysphagia 1 Activity:  Activity as tolerated DVT Prophylaxis:  SCDs   CLINICALLY SIGNIFICANT STUDIES Basic Metabolic Panel:   Lab 03/10/12 1347 03/10/12 1335  NA 139 136  K 5.0 4.9  CL  105 101  CO2 -- 24  GLUCOSE 110* 113*  BUN 28* 22  CREATININE 1.00 0.97  CALCIUM -- 9.4  MG -- --  PHOS -- --   Liver Function Tests:   Lab 03/10/12 1335  AST 29  ALT 12  ALKPHOS 63  BILITOT 0.2*  PROT 6.9  ALBUMIN 3.3*   CBC:   Lab 03/10/12 1347 03/10/12 1335  WBC -- 7.8  NEUTROABS -- 5.1  HGB 15.6* 14.6  HCT 46.0 45.1  MCV -- 89.1  PLT -- 237   Coagulation:   Lab 03/10/12 1324  LABPROT 15.2  INR 1.22   Cardiac Enzymes:   Lab 03/10/12 1335  CKTOTAL --  CKMB --  CKMBINDEX --  TROPONINI <0.30   Lipid Panel    Component Value Date/Time   CHOL 186 03/11/2012 0400   TRIG 153* 03/11/2012 0400   HDL 43 03/11/2012 0400   CHOLHDL 4.3 03/11/2012 0400   VLDL 31 03/11/2012 0400   LDLCALC 112* 03/11/2012 0400   HgbA1C  Lab Results  Component Value Date   HGBA1C 5.8* 03/11/2012    Urine Drug Screen:     Component Value Date/Time   LABOPIA NONE DETECTED 03/26/2011 2359   COCAINSCRNUR NONE DETECTED 03/26/2011 2359   LABBENZ NONE DETECTED 03/26/2011 2359   AMPHETMU NONE DETECTED 03/26/2011 2359  THCU NONE DETECTED 03/26/2011 2359   LABBARB NONE DETECTED 03/26/2011 2359    Alcohol Level: No results found for this basename: ETH:2 in the last 168 hours  CT of the brain  03/10/2012   1.  No acute finding. 2.  Atrophy and chronic microvascular ischemic change  MRI of the brain  Patchy left insular and frontal cortex small infarcts  MRA of the brain  Moderate left m1 and diffuse mild atherosclerosis  2D Echocardiogram  Left ventricle: The cavity size was normal. Wall thickness was increased in a pattern of severe LVH. Systolic function was normal. The estimated ejection fraction was in the range of 50% to 55%  Carotid Doppler  no hemodynamically significant stenosis bilaterally.  CXR  03/10/2012  Enlargement of cardiac silhouette. Small left pleural effusion with atelectasis versus consolidation in left lower lobe.     EKG  atrial fibrillation, rate 64.   Therapy  Recommendations  SNF  Physical Exam   Frail elderly lady currently not in distressAwake alert. Afebrile. Head is nontraumatic. Neck is supple without bruit. Hearing is diminished.. Cardiac exam no murmur or gallop. Lungs are clear to auscultation. Distal pulses are well felt.  Neurological Exam : Awake alert nonfluent speech with mild aphasia expressive greater than receptive can speak short sentences. Able to follow  simple midline commands. Blinks to threat bilaterally. Follows gaze in all directions. Fundi were not visualized. Visual acuity and fields are difficult to test No mild by facial weakness. No upper or lower eczema to drift. Cooperation is limited for detailed muscle testing but I do not see any significant focal weakness. Sensation and coordination are difficult to test. Plantars are downgoing. Gait was not tested.   ASSESSMENT Julie Frank is a 78 y.o. female presenting with right hemiplegia, left gaze deviation, mutism. Status post IV t-PA 03/10/2012 at 1404. Imaging shows patch left MCA branch Infarcts felt to be embolic secondary to known atrial fibrillation. Workup underway. On aspirin 81 mg orally every day prior to admission. Now on no antiplatelets as within 24h of tpa , but did start Aspirin 325mg  daily after MRI cleared post tPA for secondary stroke prevention. Patient with resultant  global aphasia with resolved right hemiparesis.Marland Kitchen  atrial fibrillation, not on coumadin PTA secondary to repeated falls Hypertension Hyperlipidemia, LDL 112, goal LDL < 100, on lipitor PVD Vascular dementia Stroke 03/2006, 04/2009 [facility] [place] day # 3  TREATMENT/PLAN  Continue aspirin for secondary stroke prevention. As not anticoagulation candidate given age, fall risk  SNF recommended  Risk factor management  Patient to transfer to SNF today.   Dr. Pearlean Brownie discussed plan with daughter.     Gwendolyn Lima. Manson Passey, [facility], MBA, MHA [facility] Pager:  [phone] 03/13/2012 9:04 AM      I have personally reviewed this chart, examined this patient and developed plan of care. Delia Heady, MD Medical Director [facility] Pager: [phone] 03/13/2012 11:57 AM

## 2012-03-13 NOTE — Clinical Social Work Note (Signed)
Clinical Social Work   Pt is ready for discharge to [place]. Facility has received discharge summary and is ready to accept pt. Pt and family are agreeable to discharge plan. [facility] will provide transportation to facility. CSW is signing off as no further needs identified.   Dede Query, MSW, [facility] Majors [phone]

## 2012-03-16 ENCOUNTER — Telehealth: Payer: Self-pay | Admitting: Cardiology

## 2012-03-16 LAB — URINE CULTURE: Colony Count: >=100000

## 2012-03-16 NOTE — Telephone Encounter (Signed)
Pt daughter wants to ask another medication question and she will be at this number until 2pm can you please call her/lg

## 2012-03-16 NOTE — Telephone Encounter (Signed)
Spoke to daughter Britta Mccreedy she wanted to verify lasix is just if needed.Daughter was told lasix should be given only if needed.

## 2012-03-16 NOTE — Telephone Encounter (Signed)
Spoke with patient's daughter Britta Mccreedy was told Dr.Jordan advised only take lasix if has increase sob,edema.Daughter stated her mother continues to improve from her recent stroke.Advised to keep appointment with Dr.Jordan in 6 months and call sooner if needed.

## 2012-06-01 ENCOUNTER — Other Ambulatory Visit (HOSPITAL_COMMUNITY): Payer: Medicare Other

## 2012-06-01 ENCOUNTER — Ambulatory Visit (HOSPITAL_COMMUNITY): Admission: RE | Admit: 2012-06-01 | Payer: Medicare Other | Source: Ambulatory Visit

## 2012-06-02 ENCOUNTER — Other Ambulatory Visit (HOSPITAL_COMMUNITY): Payer: Medicare Other

## 2012-06-02 ENCOUNTER — Ambulatory Visit (HOSPITAL_COMMUNITY): Admission: RE | Admit: 2012-06-02 | Payer: Medicare Other | Source: Ambulatory Visit

## 2012-06-04 ENCOUNTER — Inpatient Hospital Stay (HOSPITAL_COMMUNITY): Admission: RE | Admit: 2012-06-04 | Payer: Medicare Other | Source: Ambulatory Visit

## 2012-06-04 ENCOUNTER — Ambulatory Visit (HOSPITAL_COMMUNITY): Payer: Medicare Other

## 2012-06-09 ENCOUNTER — Telehealth: Payer: Self-pay | Admitting: *Deleted

## 2012-06-09 NOTE — Telephone Encounter (Signed)
I called the daughter. The recommendations are for TPA not be given for 90 days after a moderate to large stroke. I discussed this with the daughter.

## 2012-06-09 NOTE — Telephone Encounter (Signed)
Patient's daughter called wanting to know if there's a limit on giving TPA and how many days once this medicatin is given how many days after can it be given again.

## 2012-06-11 ENCOUNTER — Ambulatory Visit (HOSPITAL_COMMUNITY)
Admission: RE | Admit: 2012-06-11 | Discharge: 2012-06-11 | Disposition: A | Discharge status: Home / Self Care | Payer: Medicare Other | Source: Ambulatory Visit | Attending: Neurology | Admitting: Neurology

## 2012-06-11 ENCOUNTER — Ambulatory Visit (HOSPITAL_COMMUNITY)
Admission: RE | Admit: 2012-06-11 | Discharge: 2012-06-11 | Disposition: A | Discharge status: Home / Self Care | Payer: Medicare Other | Source: Ambulatory Visit | Attending: Internal Medicine | Admitting: Internal Medicine

## 2012-06-11 DIAGNOSIS — R1310 Dysphagia, unspecified: Secondary | ICD-10-CM | POA: Insufficient documentation

## 2012-06-11 NOTE — Procedures (Signed)
Objective Swallowing Evaluation: Modified Barium Swallowing Study  Patient Details  Name: Julie Frank MRN: [id number] Date of Birth: December 14, 1909  Today's Date: 06/11/2012 Time: 1478-2956 SLP Time Calculation (min): 35 min  Past Medical History:  Past Medical History  Diagnosis Date  . Mitral insufficiency     CHRONIC  . Hypertension   . Debility     GENERALIZED  . Hyperlipidemia   . OA (osteoarthritis)   . OP (osteoporosis)   . CHF (congestive heart failure)   . History of diastolic dysfunction   . Muscle weakness (generalized)   . Difficulty walking   . Metabolic encephalopathy   . Hypothyroidism   . HOH (hard of hearing)     "extremely"  . SVT (supraventricular tachycardia)     nonsustained  . Heart murmur   . Repeated falls 2010    "3 serious falls; hit head each time; staples 1st 2 times"  . Vascular dementia dx'd 2011  . Peripheral vascular disease   . Dry cough 03/26/11    "chronic; dx'd years ago as allergy related type of thing"  . Bruises easily   . Chronic back pain greater than 3 months duration   . CVA (cerebrovascular accident) 03/2006; 04/2009    residual "speech problems & short term memory decreased"  . PNA (pneumonia)   . UTI (lower urinary tract infection)    Past Surgical History:  Past Surgical History  Procedure Laterality Date  . Removal colon polpys  12/2000    Villous adenoma of the cecum.  . Tonsillectomy and adenoidectomy    . Appendectomy    . Cataract extraction w/ intraocular lens  implant, bilateral  1984  . Dilation and curettage of uterus  1960's    "several"   HPI:  Pt is 79 year old female with hx of CVAs 03/2006, 04/2009, and 12/13, returns for [facility] today to determine degree of dysphagia.  Pt was followed for aphasia and dysphagia during last admission in December.  Underwent MBS 03/11/12 which revealed a mod dysphagia.  Pt was D/Cd to a SNF on a dysphagia 1 diet with nectar-thick liquids.       Assessment / Plan /  Recommendation Clinical Impression  Dysphagia Diagnosis: Within Functional Limits for normal elderly population Clinical impression: Pt presents with functional swallow with resolution of deficits identified after 02/2012 CVA.  Presents with premature spillage of POs to level of valleculae with consistent high penetration of thin and nectar-thick liquids, however penetrate is ejected from larynx upon completion of swallow with no incidents of aspiration.  There is no residue post-swallow.  Rec regular or mechanical soft diet (per preferences); thin liquids; continue to crush meds with puree given propensity for penetration and curvature of cervical vertebrae.    Treatment Recommendation       Diet Recommendation Regular;Thin liquid   Liquid Administration via: Cup;Straw Medication Administration: Crushed with puree Compensations: Slow rate;Small sips/bites Postural Changes and/or Swallow Maneuvers: Seated upright 90 degrees    Other  Recommendations Oral Care Recommendations: Oral care BID   Follow Up Recommendations  None      General HPI: Pt is 79 year old female with hx of CVAs 03/2006, 04/2009, and 12/13, returns for [facility] today to determine degree of dysphagia.  Pt was followed for aphasia and dysphagia during last admission in December.  Underwent MBS 03/11/12 which revealed a mod dysphagia.  Pt was D/Cd to a SNF on a dysphagia 1 diet with nectar-thick liquids.   Type of Study:  Modified Barium Swallowing Study Reason for Referral: Objectively evaluate swallowing function Previous Swallow Assessment: Patient presents with a moderate primarily motor based oropharyngeal dysphagia. Oral phase characterized by decreased bolus containment, anterior labial spillage, delayed oral transit (greatest with soft solids), followed by loss of bolus over the base of the tongue. Eventual delayed swallow initiation results in penetration and aspiration of all liquid consistencies, and aspiration 1 x of  solid bolus, all with throat clear in response. Deepest aspiration noted with thin liquids in which throat clear was ineffective to clear the airway. Pharyngeal strength intact and nectar thick liquids provided via tsp in order to control bolus size was effective to aid in airway protection during today's evaluation. Recommend conservative diet of dysphagia 1 (puree) with nectar thick liquids via tsp, meds crushed in puree, with full supervision for use of precautions and diet tolerance. SLP will f/u closely at bedside Diet Prior to this Study: Dysphagia 3 (soft);Nectar-thick liquids Temperature Spikes Noted: No Respiratory Status: Room air History of Recent Intubation: No Behavior/Cognition: Alert;Cooperative;Pleasant mood Oral Cavity - Dentition: Adequate natural dentition Oral Motor / Sensory Function: Within functional limits Self-Feeding Abilities: Able to feed self Patient Positioning: Upright in chair Baseline Vocal Quality: Clear Volitional Cough: Strong    Reason for Referral Objectively evaluate swallowing function   Oral Phase Oral Preparation/Oral Phase Oral Phase: WFL   Pharyngeal Phase Pharyngeal Phase Pharyngeal Phase: Impaired Pharyngeal - Nectar Pharyngeal - Nectar Cup: Premature spillage to valleculae;Penetration/Aspiration before swallow Penetration/Aspiration details (nectar cup): Material enters airway, remains ABOVE vocal cords then ejected out Pharyngeal - Thin Pharyngeal - Thin Straw: Premature spillage to valleculae;Penetration/Aspiration before swallow Penetration/Aspiration details (thin straw): Material enters airway, remains ABOVE vocal cords then ejected out  Cervical Esophageal Phase    GO    Cervical Esophageal Phase Cervical Esophageal Phase:  (prominent CP)    Functional Assessment Tool Used: clinical judgement Functional Limitations: Swallowing Swallow Current Status ([id number]): At least 1 percent but less than 20 percent impaired, limited or  restricted Swallow Goal Status [phone]): At least 1 percent but less than 20 percent impaired, limited or restricted Swallow Discharge Status [phone]): At least 1 percent but less than 20 percent impaired, limited or restricted   Sally-Anne Wamble L. Samson Frederic, [place] CCC/SLP Pager [phone]  Blenda Mounts Laurice 06/11/2012, 2:11 PM

## 2012-06-26 DIAGNOSIS — L97509 Non-pressure chronic ulcer of other part of unspecified foot with unspecified severity: Secondary | ICD-10-CM

## 2012-06-30 ENCOUNTER — Non-Acute Institutional Stay (SKILLED_NURSING_FACILITY): Payer: Medicare Other | Admitting: Nurse Practitioner

## 2012-06-30 ENCOUNTER — Encounter: Payer: Self-pay | Admitting: Nurse Practitioner

## 2012-06-30 DIAGNOSIS — R634 Abnormal weight loss: Secondary | ICD-10-CM

## 2012-06-30 DIAGNOSIS — I639 Cerebral infarction, unspecified: Secondary | ICD-10-CM

## 2012-06-30 DIAGNOSIS — I10 Essential (primary) hypertension: Secondary | ICD-10-CM

## 2012-06-30 DIAGNOSIS — I6350 Cerebral infarction due to unspecified occlusion or stenosis of unspecified cerebral artery: Secondary | ICD-10-CM

## 2012-06-30 DIAGNOSIS — E039 Hypothyroidism, unspecified: Secondary | ICD-10-CM

## 2012-06-30 NOTE — Assessment & Plan Note (Signed)
Will check TSH

## 2012-06-30 NOTE — Assessment & Plan Note (Signed)
Patient is stable; continue current regimen. Will monitor and make changes as necessary.  

## 2012-06-30 NOTE — Progress Notes (Signed)
Patient ID: Julie Frank, female   DOB: 16-Nov-1909, 79 y.o.   MRN: [id number]  Chief Complaint: medical management   HPI:  79 year old female with a history of CVA, CHF, HTN, hyperlipidemia who is a long term resident of SNF. Recently pt has had a 10 lbs wt loss in a month. Staff reports pt is not eating like she used to. Pt reports she is doing well and is without complaints.   Review of Systems:  Review of Systems  Constitutional: Positive for weight loss. Negative for malaise/fatigue.  HENT: Negative for congestion and sore throat.   Respiratory: Negative for cough, sputum production and shortness of breath.   Cardiovascular: Negative for chest pain and leg swelling.  Gastrointestinal: Negative for heartburn, nausea, vomiting, abdominal pain, diarrhea and constipation.  Genitourinary: Negative for dysuria.  Musculoskeletal: Negative for myalgias.  Skin: Negative for itching and rash.  Neurological: Positive for weakness.  Psychiatric/Behavioral: Positive for memory loss. Negative for depression. The patient is not nervous/anxious.      Medications: Patient's Medications  New Prescriptions   No medications on file  Previous Medications   ASPIRIN 325 MG TABLET    Take 1 tablet (325 mg total) by mouth daily.   ATORVASTATIN (LIPITOR) 10 MG TABLET    Take 1 tablet (10 mg total) by mouth daily at 6 PM.   CALCIUM CARBONATE-VITAMIN D (CALCIUM 600 + D PO)    Take 1 tablet by mouth 2 (two) times daily.     CRANBERRY 425 MG CAPS    Take 1 capsule by mouth 2 (two) times daily.   LEVOTHYROXINE (SYNTHROID, LEVOTHROID) 25 MCG TABLET    Take 25 mcg by mouth daily.     METOPROLOL SUCCINATE (TOPROL-XL) 25 MG 24 HR TABLET    Take 25 mg by mouth daily.     POLYETHYLENE GLYCOL (MIRALAX / GLYCOLAX) PACKET    Take 17 g by mouth every other day. As needed for constipation.  Modified Medications   No medications on file  Discontinued Medications   OMEPRAZOLE (PRILOSEC) 20 MG CAPSULE    Take 20 mg  by mouth daily.     SULFAMETHOXAZOLE-TRIMETHOPRIM (BACTRIM DS) 800-160 MG PER TABLET    Take 1 tablet by mouth daily. Take one tablet by mouth daily for 10 days.     Physical Exam: Physical Exam  Nursing note and vitals reviewed. Constitutional: Vital signs are normal. No distress.  HENT:  Head: Normocephalic and atraumatic.  Eyes: Conjunctivae and EOM are normal. Pupils are equal, round, and reactive to light.  Neck: Normal range of motion. Neck supple.  Cardiovascular: Normal rate, regular rhythm and normal heart sounds.   Pulmonary/Chest: Effort normal and breath sounds normal.  Abdominal: Soft. Bowel sounds are normal. She exhibits no distension. There is no tenderness.  Neurological: She is alert.  Skin: Skin is warm and dry. She is not diaphoretic.    Filed Vitals:   06/30/12 1042  BP: 136/79  Pulse: 70  Temp: 97 F (36.1 C)  Resp: 20  Height: 5' (1.524 m)  Weight: 96 lb 3.2 oz (43.636 kg)      Labs reviewed: Basic Metabolic Panel:  Recent Labs  40/98/11 1335 03/10/12 1347  NA 136 139  K 4.9 5.0  CL 101 105  CO2 24  --   GLUCOSE 113* 110*  BUN 22 28*  CREATININE 0.97 1.00  CALCIUM 9.4  --     Liver Function Tests:  Recent Labs  03/10/12 1335  AST 29  ALT 12  ALKPHOS 63  BILITOT 0.2*  PROT 6.9  ALBUMIN 3.3*    CBC:  Recent Labs  03/10/12 1335 03/10/12 1347  WBC 7.8  --   NEUTROABS 5.1  --   HGB 14.6 15.6*  HCT 45.1 46.0  MCV 89.1  --   PLT 237  --     Assessment/Plan CVA (cerebrovascular accident) Patient is stable; continue current regimen. Will monitor and make changes as necessary.   Hypertension Patient is stable; continue current regimen. Will monitor and make changes as necessary.   Unspecified hypothyroidism Will check TSH  Weight loss 10lbs in 1 month- already on supplements. With decreased appetite. Will start remeron 15mg  qhs. Will check CBC with diff, cmp and tsh at this time.

## 2012-06-30 NOTE — Assessment & Plan Note (Signed)
10lbs in 1 month- already on supplements. With decreased appetite. Will start remeron 15mg  qhs. Will check CBC with diff, cmp and tsh at this time.

## 2012-07-30 ENCOUNTER — Non-Acute Institutional Stay (SKILLED_NURSING_FACILITY): Payer: Medicare Other | Admitting: Nurse Practitioner

## 2012-07-30 ENCOUNTER — Encounter: Payer: Self-pay | Admitting: Nurse Practitioner

## 2012-07-30 DIAGNOSIS — E039 Hypothyroidism, unspecified: Secondary | ICD-10-CM

## 2012-07-30 DIAGNOSIS — R634 Abnormal weight loss: Secondary | ICD-10-CM

## 2012-07-30 DIAGNOSIS — I10 Essential (primary) hypertension: Secondary | ICD-10-CM

## 2012-07-30 DIAGNOSIS — R319 Hematuria, unspecified: Secondary | ICD-10-CM

## 2012-07-30 NOTE — Assessment & Plan Note (Signed)
Awaiting C&S

## 2012-07-30 NOTE — Progress Notes (Signed)
Patient ID: Julie Frank, female   DOB: March 31, 1909, 79 y.o.   MRN: [id number]  Chief Complaint: medical management of chronic conditions   HPI:  79 year old female being seen today for routine visit with a history of CVA, CHF, HTN, hyperlipidemia who is a long term resident of SNF. Pt is currently being tested for UTI due to blood in urine- currently no blood noted. Pt with increased appetite since started on Remeron she has already had a noted weight gain. Staff without any new concerns at this time.    Review of Systems:  Review of Systems  Constitutional: Negative for fever and chills.  Respiratory: Negative for shortness of breath.   Cardiovascular: Negative for chest pain.  Gastrointestinal: Negative for heartburn, abdominal pain, diarrhea and constipation.  Genitourinary: Negative for dysuria.  Neurological: Negative for weakness and headaches.  Psychiatric/Behavioral: Positive for memory loss. The patient does not have insomnia.   limited ROS due to memory loss  medications: Patient's Medications  New Prescriptions   No medications on file  Previous Medications   ASPIRIN 325 MG TABLET    Take 1 tablet (325 mg total) by mouth daily.   CALCIUM CARBONATE-VITAMIN D (CALCIUM 600 + D PO)    Take 1 tablet by mouth 2 (two) times daily.     CRANBERRY 425 MG CAPS    Take 1 capsule by mouth 2 (two) times daily.   LEVOTHYROXINE (SYNTHROID, LEVOTHROID) 25 MCG TABLET    Take 25 mcg by mouth daily.     METOPROLOL SUCCINATE (TOPROL-XL) 25 MG 24 HR TABLET    Take 25 mg by mouth daily.     MIRTAZAPINE (REMERON) 15 MG TABLET    Take 15 mg by mouth at bedtime.   POLYETHYLENE GLYCOL (MIRALAX / GLYCOLAX) PACKET    Take 17 g by mouth every other day. As needed for constipation.   SIMVASTATIN (ZOCOR) 20 MG TABLET    Take 20 mg by mouth every evening.  Modified Medications   No medications on file  Discontinued Medications   ATORVASTATIN (LIPITOR) 10 MG TABLET    Take 1 tablet (10 mg total) by  mouth daily at 6 PM.     Physical Exam: Physical Exam  Constitutional: No distress.  Thin body habitus   HENT:  Head: Normocephalic and atraumatic.  Nose: Nose normal.  Mouth/Throat: Oropharynx is clear and moist. No oropharyngeal exudate.  Eyes: Conjunctivae and EOM are normal. Pupils are equal, round, and reactive to light.  Neck: Normal range of motion. Neck supple.  Cardiovascular: Normal rate, regular rhythm and normal heart sounds.   Pulmonary/Chest: Effort normal and breath sounds normal. No respiratory distress.  Abdominal: Soft. Bowel sounds are normal. She exhibits no distension. There is no tenderness.  Musculoskeletal: She exhibits edema (trace edema). She exhibits no tenderness.  Neurological: She is alert.  Skin: Skin is warm and dry. She is not diaphoretic.  Psychiatric: She exhibits abnormal recent memory and abnormal remote memory.     Filed Vitals:   07/30/12 1544  BP: 149/76  Pulse: 72  Temp: 97.4 F (36.3 C)  Resp: 18  Weight: 101 lb (45.813 kg)      Labs reviewed: Basic Metabolic Panel:  Recent Labs  74/25/95 1335 03/10/12 1347  NA 136 139  K 4.9 5.0  CL 101 105  CO2 24  --   GLUCOSE 113* 110*  BUN 22 28*  CREATININE 0.97 1.00  CALCIUM 9.4  --     Liver  Function Tests:  Recent Labs  03/10/12 1335  AST 29  ALT 12  ALKPHOS 63  BILITOT 0.2*  PROT 6.9  ALBUMIN 3.3*    CBC:  Recent Labs  03/10/12 1335 03/10/12 1347  WBC 7.8  --   NEUTROABS 5.1  --   HGB 14.6 15.6*  HCT 45.1 46.0  MCV 89.1  --   PLT 237  --     Significant Diagnostic Results:  CBC with Diff       Result: 07/01/2012 2:53 PM    ( Status: F )       C     WBC  6.1        4.0-10.5  K/uL  SLN       RBC  4.83        3.87-5.11  MIL/uL  SLN       Hemoglobin  13.4        12.0-15.0  g/dL  SLN       Hematocrit  40.4        36.0-46.0  %  SLN       MCV  83.6        78.0-100.0  fL  SLN       MCH  27.7        26.0-34.0  pg  SLN       MCHC  33.2        30.0-36.0   g/dL  SLN       RDW  69.6     H  11.5-15.5  %  SLN       Platelet Count  216        150-400  K/uL  SLN       Granulocyte %  60        43-77  %  SLN       Absolute Gran  3.7        1.7-7.7  K/uL  SLN       Lymph %  25        12-46  %  SLN       Absolute Lymph  1.5        0.7-4.0  K/uL  SLN       Mono %  11        3-12  %  SLN       Absolute Mono  0.7        0.1-1.0  K/uL  SLN       Eos %  3        0-5  %  SLN       Absolute Eos  0.2        0.0-0.7  K/uL  SLN       Baso %  1        0-1  %  SLN       Absolute Baso  0.1        0.0-0.1  K/uL  SLN       Smear Review  Criteria for review not met   SLN      Comprehensive Metabolic Panel       Result: 07/01/2012 2:29 PM    ( Status: F )            Sodium  143        135-145  mEq/L  SLN       Potassium  4.9        3.5-5.3  mEq/L  SLN       Chloride  109        96-112  mEq/L  SLN       CO2  31        19-32  mEq/L  SLN       Glucose  84        70-99  mg/dL  SLN       BUN  24     H  6-23  mg/dL  SLN       Creatinine  0.71        0.50-1.10  mg/dL  SLN       Bilirubin, Total  0.7        0.3-1.2  mg/dL  SLN       Alkaline Phosphatase  60        39-117  U/L  SLN       AST/SGOT  26        0-37  U/L  SLN       ALT/SGPT  22        0-35  U/L  SLN       Total Protein  5.2     L  6.0-8.3  g/dL  SLN       Albumin  2.9     L  3.5-5.2  g/dL  SLN       Calcium  8.4        8.4-10.5  mg/dL  SLN      TSH, Ultrasensitive       Result: 07/01/2012 3:39 PM    ( Status: F )            TSH  1.706        0.350-4.500  uIU/mL  SLN      Assessment/Plan Weight loss Weight is up since remeron has been started- will cont to monitor   Unspecified hypothyroidism Patient is stable; continue current regimen. Will monitor and make changes as necessary.   Hematuria, unspecified Awaiting C&S  Hypertension Patient is stable; continue current regimen. Will monitor and make changes as necessary.

## 2012-07-30 NOTE — Assessment & Plan Note (Signed)
Weight is up since remeron has been started- will cont to monitor

## 2012-07-30 NOTE — Assessment & Plan Note (Signed)
Patient is stable; continue current regimen. Will monitor and make changes as necessary.  

## 2012-08-18 NOTE — Assessment & Plan Note (Signed)
Patient is stable; continue current regimen. Will monitor and make changes as necessary.  

## 2012-08-31 ENCOUNTER — Non-Acute Institutional Stay (SKILLED_NURSING_FACILITY): Payer: Medicare Other | Admitting: Nurse Practitioner

## 2012-08-31 DIAGNOSIS — R634 Abnormal weight loss: Secondary | ICD-10-CM

## 2012-08-31 DIAGNOSIS — I10 Essential (primary) hypertension: Secondary | ICD-10-CM

## 2012-08-31 DIAGNOSIS — I6350 Cerebral infarction due to unspecified occlusion or stenosis of unspecified cerebral artery: Secondary | ICD-10-CM

## 2012-08-31 DIAGNOSIS — I639 Cerebral infarction, unspecified: Secondary | ICD-10-CM

## 2012-08-31 DIAGNOSIS — N390 Urinary tract infection, site not specified: Secondary | ICD-10-CM

## 2012-08-31 NOTE — Progress Notes (Signed)
Patient ID: Julie Frank, female   DOB: 1909-08-27, 79 y.o.   MRN: [id number]   No Known Allergies  Chief Complaint  Patient presents with  . Medical Managment of Chronic Issues    HPI: Patient is a 79 y.o. female who is a long term resident of [facility] is seen today for routine follow up. Pt without complaints and staff has no concerns at this time. Pt was recently started on Remeron and has had a weight gain and staff reports she is eating well.   Review of Systems:  Review of Systems  Constitutional: Negative for fever, chills and malaise/fatigue.  Eyes: Negative.   Respiratory: Negative for cough and shortness of breath.   Cardiovascular: Negative for chest pain.  Gastrointestinal: Negative for heartburn, abdominal pain, diarrhea and constipation.  Genitourinary: Negative for dysuria.  Musculoskeletal: Negative.   Skin: Negative.   Neurological: Negative for weakness and headaches.  Psychiatric/Behavioral: Positive for memory loss. Negative for depression. The patient is not nervous/anxious and does not have insomnia.      Past Medical History  Diagnosis Date  . Mitral insufficiency     CHRONIC  . Hypertension   . Debility     GENERALIZED  . Hyperlipidemia   . OA (osteoarthritis)   . OP (osteoporosis)   . CHF (congestive heart failure)   . History of diastolic dysfunction   . Muscle weakness (generalized)   . Difficulty walking   . Metabolic encephalopathy   . Hypothyroidism   . HOH (hard of hearing)     "extremely"  . SVT (supraventricular tachycardia)     nonsustained  . Heart murmur   . Repeated falls 2010    "3 serious falls; hit head each time; staples 1st 2 times"  . Vascular dementia dx'd 2011  . Peripheral vascular disease   . Dry cough 03/26/11    "chronic; dx'd years ago as allergy related type of thing"  . Bruises easily   . Chronic back pain greater than 3 months duration   . CVA (cerebrovascular accident) 03/2006; 04/2009    residual "speech  problems & short term memory decreased"  . PNA (pneumonia)   . UTI (lower urinary tract infection)    Past Surgical History  Procedure Laterality Date  . Removal colon polpys  12/2000    Villous adenoma of the cecum.  . Tonsillectomy and adenoidectomy    . Appendectomy    . Cataract extraction w/ intraocular lens  implant, bilateral  1984  . Dilation and curettage of uterus  1960's    "several"   Social History:   reports that she has never smoked. She has never used smokeless tobacco. She reports that she does not drink alcohol or use illicit drugs.  Family History  Problem Relation Age of Onset  . Heart disease Mother     Medications: Patient's Medications  New Prescriptions   No medications on file  Previous Medications   ASPIRIN 325 MG TABLET    Take 1 tablet (325 mg total) by mouth daily.   CALCIUM CARBONATE-VITAMIN D (CALCIUM 600 + D PO)    Take 1 tablet by mouth 2 (two) times daily.     CRANBERRY 425 MG CAPS    Take 1 capsule by mouth 2 (two) times daily.   LEVOTHYROXINE (SYNTHROID, LEVOTHROID) 25 MCG TABLET    Take 25 mcg by mouth daily.     METOPROLOL SUCCINATE (TOPROL-XL) 25 MG 24 HR TABLET    Take 25 mg by mouth  daily.     MIRTAZAPINE (REMERON) 15 MG TABLET    Take 15 mg by mouth at bedtime.   POLYETHYLENE GLYCOL (MIRALAX / GLYCOLAX) PACKET    Take 17 g by mouth every other day. As needed for constipation.   SIMVASTATIN (ZOCOR) 20 MG TABLET    Take 20 mg by mouth every evening.  Modified Medications   No medications on file  Discontinued Medications   No medications on file     Physical Exam:  Filed Vitals:   08/31/12 1740  BP: 110/72  Pulse: 70  Temp: 97.2 F (36.2 C)  Resp: 20  Weight: 106 lb (48.081 kg)   Physical Exam  Constitutional: No distress.  HENT:  Mouth/Throat: Oropharynx is clear and moist.  Cardiovascular: Normal rate, regular rhythm, normal heart sounds and intact distal pulses.   Pulmonary/Chest: Effort normal and breath sounds  normal. No respiratory distress.  Abdominal: Soft. Bowel sounds are normal.  Musculoskeletal: She exhibits edema (trace). She exhibits no tenderness.  Neurological: She is alert.  Skin: Skin is warm and dry. She is not diaphoretic.    Labs reviewed: Basic Metabolic Panel:  Recent Labs  40/98/11 1335 03/10/12 1347  NA 136 139  K 4.9 5.0  CL 101 105  CO2 24  --   GLUCOSE 113* 110*  BUN 22 28*  CREATININE 0.97 1.00  CALCIUM 9.4  --    Liver Function Tests:  Recent Labs  03/10/12 1335  AST 29  ALT 12  ALKPHOS 63  BILITOT 0.2*  PROT 6.9  ALBUMIN 3.3*   No results found for this basename: LIPASE, AMYLASE,  in the last 8760 hours No results found for this basename: AMMONIA,  in the last 8760 hours CBC:  Recent Labs  03/10/12 1335 03/10/12 1347  WBC 7.8  --   NEUTROABS 5.1  --   HGB 14.6 15.6*  HCT 45.1 46.0  MCV 89.1  --   PLT 237  --    Lipid Panel:  Recent Labs  03/11/12 0400  CHOL 186  HDL 43  LDLCALC 112*  TRIG 153*  CHOLHDL 4.3   CBC with Diff       Result: 07/25/2012 1:15 PM    ( Status: F )            WBC  6.8        4.0-10.5  K/uL  SLN       RBC  5.70     H  3.87-5.11  MIL/uL  SLN       Hemoglobin  16.1     H  12.0-15.0  g/dL  SLN       Hematocrit  47.6     H  36.0-46.0  %  SLN       MCV  83.5        78.0-100.0  fL  SLN       MCH  28.2        26.0-34.0  pg  SLN       MCHC  33.8        30.0-36.0  g/dL  SLN       RDW  91.4     H  11.5-15.5  %  SLN       Platelet Count  236        150-400  K/uL  SLN       Granulocyte %  65        43-77  %  SLN  Absolute Gran  4.4        1.7-7.7  K/uL  SLN       Lymph %  24        12-46  %  SLN       Absolute Lymph  1.6        0.7-4.0  K/uL  SLN       Mono %  9        3-12  %  SLN       Absolute Mono  0.6        0.1-1.0  K/uL  SLN       Eos %  2        0-5  %  SLN       Absolute Eos  0.1        0.0-0.7  K/uL  SLN       Baso %  0        0-1  %  SLN       Absolute Baso  0.0        0.0-0.1  K/uL  SLN        Smear Review  Criteria for review not met   SLN      Basic Metabolic Panel       Result: 07/25/2012 1:23 PM    ( Status: F )            Sodium  140        135-145  mEq/L  SLN       Potassium  4.6        3.5-5.3  mEq/L  SLN       Chloride  105        96-112  mEq/L  SLN       CO2  29        19-32  mEq/L  SLN       Glucose  97        70-99  mg/dL  SLN       BUN  23        6-23  mg/dL  SLN       Creatinine  0.77        0.50-1.10  mg/dL  SLN       Calcium  9.2        8.4-10.5  mg/dL  SLN        Assessment/Plan    1.   Weight loss 783.21      weight gain with Remeron will cont medication and follow weights   2.   Hypertension 401.9     Patient is stable; continue current regimen. Will monitor and make changes as necessary.   3.   CVA (cerebrovascular accident)   Patient is stable; continue current regimen. Will monitor and make changes as necessary.    4. UTI  recently treated for UTI due to hematuria being followed by urology

## 2012-10-01 ENCOUNTER — Non-Acute Institutional Stay (SKILLED_NURSING_FACILITY): Payer: Medicare Other | Admitting: Nurse Practitioner

## 2012-10-01 DIAGNOSIS — R634 Abnormal weight loss: Secondary | ICD-10-CM

## 2012-10-01 DIAGNOSIS — I639 Cerebral infarction, unspecified: Secondary | ICD-10-CM

## 2012-10-01 DIAGNOSIS — I10 Essential (primary) hypertension: Secondary | ICD-10-CM

## 2012-10-01 DIAGNOSIS — I6350 Cerebral infarction due to unspecified occlusion or stenosis of unspecified cerebral artery: Secondary | ICD-10-CM

## 2012-10-01 NOTE — Progress Notes (Signed)
Patient ID: Julie Frank, female   DOB: May 18, 1909, 79 y.o.   MRN: [id number]  Nursing Home Location:  [facility]   Place of Service: SNF (31)   Chief Complaint: medical management of chronic conditions  HPI: Patient is a 79y.o. female who is a long term resident of [facility] is seen today for routine follow up. Pt without complaints and staff has no concerns at this time. There has been no issues or visit in the last month. Pt is doing well in current setting.  Review of Systems:  Review of Systems from staff, record and pt Constitutional: Negative for fever, chills and malaise/fatigue.  Eyes: Negative.  Respiratory: Negative for cough and shortness of breath.  Cardiovascular: Negative for chest pain.  Gastrointestinal: Negative for heartburn, abdominal pain, diarrhea and constipation.  Genitourinary: Negative for dysuria.  Musculoskeletal: Negative.  Skin: Negative.  Neurological: Negative for weakness and headaches.  Psychiatric/Behavioral: Positive for memory loss. Negative for depression. The patient is not nervous/anxious and does not have insomnia   Medications: Patient's Medications  New Prescriptions   No medications on file  Previous Medications   ASPIRIN 325 MG TABLET    Take 1 tablet (325 mg total) by mouth daily.   CALCIUM CARBONATE-VITAMIN D (CALCIUM 600 + D PO)    Take 1 tablet by mouth 2 (two) times daily.     CRANBERRY 425 MG CAPS    Take 1 capsule by mouth 2 (two) times daily.   LEVOTHYROXINE (SYNTHROID, LEVOTHROID) 25 MCG TABLET    Take 25 mcg by mouth daily.     METOPROLOL SUCCINATE (TOPROL-XL) 25 MG 24 HR TABLET    Take 25 mg by mouth daily.     MIRTAZAPINE (REMERON) 15 MG TABLET    Take 15 mg by mouth at bedtime.   POLYETHYLENE GLYCOL (MIRALAX / GLYCOLAX) PACKET    Take 17 g by mouth every other day. As needed for constipation.   SIMVASTATIN (ZOCOR) 20 MG TABLET    Take 20 mg by mouth every evening.  Modified Medications   No  medications on file  Discontinued Medications   No medications on file     Physical Exam:  Filed Vitals:   10/01/12 1713  BP: 98/53  Pulse: 54  Temp: 97 F (36.1 C)  Resp: 18    Physical Exam  Constitutional: She is well-developed, well-nourished, and in no distress. No distress.  HENT:  Mouth/Throat: Oropharynx is clear and moist. No oropharyngeal exudate.  Cardiovascular: Normal rate, regular rhythm and normal heart sounds.   Pulmonary/Chest: Effort normal and breath sounds normal.  Abdominal: Soft. Bowel sounds are normal. She exhibits no distension.  Musculoskeletal: She exhibits no edema and no tenderness.  Self propels in [facility]  Neurological: She is alert.  Skin: Skin is warm and dry. She is not diaphoretic.     Labs reviewed: CBC with Diff  Result: 07/25/2012 1:15 PM ( Status: F )  WBC 6.8 4.0-10.5 K/uL SLN  RBC 5.70 H 3.87-5.11 MIL/uL SLN  Hemoglobin 16.1 H 12.0-15.0 g/dL SLN  Hematocrit 40.9 H 36.0-46.0 % SLN  MCV 83.5 78.0-100.0 fL SLN  MCH 28.2 26.0-34.0 pg SLN  MCHC 33.8 30.0-36.0 g/dL SLN  RDW 81.1 H 91.4-78.2 % SLN  Platelet Count 236 150-400 K/uL SLN  Granulocyte % 65 43-77 % SLN  Absolute Gran 4.4 1.7-7.7 K/uL SLN  Lymph % 24 12-46 % SLN  Absolute Lymph 1.6 0.7-4.0 K/uL SLN  Mono % 9 3-12 % SLN  Absolute Mono 0.6 0.1-1.0 K/uL SLN  Eos % 2 0-5 % SLN  Absolute Eos 0.1 0.0-0.7 K/uL SLN  Baso % 0 0-1 % SLN  Absolute Baso 0.0 0.0-0.1 K/uL SLN  Smear Review Criteria for review not met SLN  Basic Metabolic Panel  Result: 07/25/2012 1:23 PM ( Status: F )  Sodium 140 135-145 mEq/L SLN  Potassium 4.6 3.5-5.3 mEq/L SLN  Chloride 105 96-112 mEq/L SLN  CO2 29 19-32 mEq/L SLN  Glucose 97 70-99 mg/dL SLN  BUN 23 1-61 mg/dL SLN  Creatinine 0.96 0.45-4.09 mg/dL SLN  Calcium 9.2 8.1-19.1 mg/dL SLN    Assessment/Plan 1. Weight loss Weight down this month- will cont to monitor- will cont on remeron   2. Hypertension Blood pressure stable; HR low- will  decrease metoprolol tartrate to 12.5 mg BID  3. CVA (cerebrovascular accident) Stable.

## 2012-10-30 DIAGNOSIS — L89309 Pressure ulcer of unspecified buttock, unspecified stage: Secondary | ICD-10-CM

## 2012-11-02 ENCOUNTER — Non-Acute Institutional Stay (SKILLED_NURSING_FACILITY): Payer: Medicare Other | Admitting: Nurse Practitioner

## 2012-11-02 ENCOUNTER — Encounter: Payer: Self-pay | Admitting: Nurse Practitioner

## 2012-11-02 DIAGNOSIS — R634 Abnormal weight loss: Secondary | ICD-10-CM

## 2012-11-02 DIAGNOSIS — I10 Essential (primary) hypertension: Secondary | ICD-10-CM

## 2012-11-02 DIAGNOSIS — I6350 Cerebral infarction due to unspecified occlusion or stenosis of unspecified cerebral artery: Secondary | ICD-10-CM

## 2012-11-02 DIAGNOSIS — I639 Cerebral infarction, unspecified: Secondary | ICD-10-CM

## 2012-11-02 NOTE — Progress Notes (Signed)
Patient ID: Julie Frank, female   DOB: [birth year], 79 y.o.   MRN: [id number]  Nursing Home Location:  [facility]   Place of Service: SNF (31)  Chief Complaint  Patient presents with  . Medical Managment of Chronic Issues    HPI:  Patient is a 79 y.o. female with PMH of hypertension, CVA, hyperlipidemia, hypothyroid, and weight loss is a long term resident of [facility] is seen today for routine follow up. Pt has been doing well however she is with new would on her sacrum that has been followed by wound care. pts appetite has been diminished and she has had cont weight loss despite being on Remeron at night    Review of Systems:   DATA OBTAINED: from patient, nurse, medical record GENERAL: no complaints , no fevers, has cont decreased appetite  SKIN: No itching, rash, has wound on sacrum  MOUTH/THROAT: no pain in mouth, No sore throat, No difficulty chewing or swallowing  RESPIRATORY: No cough, wheezing, SOB CARDIAC: No chest pain, palpitations, lower extremity edema  GI: No abdominal pain, No N/V/D or constipation, No heartburn or reflux  GU: No dysuria, frequency or urgency, or incontinence  MUSCULOSKELETAL: No unrelieved bone/joint pain NEUROLOGIC: Awake, alert, appropriate to situation, No change in mental status. Moves all four, no focal deficits PSYCHIATRIC: No overt anxiety or sadness. Sleeps well. No behavior issue.  AMBULATION:  Self propels in WC   Medications: Patient's Medications  New Prescriptions   No medications on file  Previous Medications   ASPIRIN 325 MG TABLET    Take 1 tablet (325 mg total) by mouth daily.   CALCIUM CARBONATE-VITAMIN D (CALCIUM 600 + D PO)    Take 1 tablet by mouth 2 (two) times daily.     CRANBERRY 425 MG CAPS    Take 1 capsule by mouth 2 (two) times daily.   LEVOTHYROXINE (SYNTHROID, LEVOTHROID) 25 MCG TABLET    Take 25 mcg by mouth daily.     METOPROLOL SUCCINATE (TOPROL-XL) 25 MG 24 HR TABLET    Take 25 mg by mouth  daily.     MIRTAZAPINE (REMERON) 15 MG TABLET    Take 15 mg by mouth at bedtime.   POLYETHYLENE GLYCOL (MIRALAX / GLYCOLAX) PACKET    Take 17 g by mouth every other day. As needed for constipation.   SIMVASTATIN (ZOCOR) 20 MG TABLET    Take 20 mg by mouth every evening.  Modified Medications   No medications on file  Discontinued Medications   No medications on file     Physical Exam:  Filed Vitals:   11/02/12 1609  BP: 103/68  Pulse: 69  Temp: 97 F (36.1 C)  Resp: 20    GENERAL APPEARANCE: thin female in no acute distress.  SKIN: No diaphoresis rash, or stage 2 pressure ulcer on sacrum HEAD: Normocephalic, atraumatic  EYES: Conjunctiva/lids clear. Pupils round, reactive. EOMs intact.  EARS: External exam WNL. Hearing grossly normal.  NOSE: No deformity or discharge.  MOUTH/THROAT: Lips w/o lesions. Mouth and throat normal. Tongue moist, w/o lesion.  NECK: No thyroid tenderness, enlargement or nodule  RESPIRATORY: Breathing is even, unlabored. Lung sounds are clear   CARDIOVASCULAR: Heart RRR no murmurs, rubs or gallops. No peripheral edema.  ARTERIAL: radial pulse 2+, DP pulse 1+  VENOUS: No varicosities. No venous stasis skin changes  GASTROINTESTINAL: Abdomen is soft, non-tender, not distended w/ normal bowel sounds. No mass, ventral or inguinal hernia. No organomegally GENITOURINARY: Bladder non tender,  not distended  MUSCULOSKELETAL: No abnormal joints or musculature NEUROLOGIC: Oriented X3. Cranial nerves 2-12 grossly intact. Moves all extremities no tremor. PSYCHIATRIC: Mood and affect appropriate to situation, no behavioral issues  Labs reviewed/Significant Diagnostic Results: CBC with Diff   Result: 07/25/2012 1:15 PM ( Status: F )   WBC 6.8 4.0-10.5 K/uL SLN   RBC 5.70 H 3.87-5.11 MIL/uL SLN   Hemoglobin 16.1 H 12.0-15.0 g/dL SLN   Hematocrit 19.1 H 36.0-46.0 % SLN   MCV 83.5 78.0-100.0 fL SLN   MCH 28.2 26.0-34.0 pg SLN   MCHC 33.8 30.0-36.0 g/dL SLN   RDW  47.8 H 29.5-62.1 % SLN   Platelet Count 236 150-400 K/uL SLN   Granulocyte % 65 43-77 % SLN   Absolute Gran 4.4 1.7-7.7 K/uL SLN   Lymph % 24 12-46 % SLN   Absolute Lymph 1.6 0.7-4.0 K/uL SLN   Mono % 9 3-12 % SLN   Absolute Mono 0.6 0.1-1.0 K/uL SLN   Eos % 2 0-5 % SLN   Absolute Eos 0.1 0.0-0.7 K/uL SLN   Baso % 0 0-1 % SLN   Absolute Baso 0.0 0.0-0.1 K/uL SLN   Smear Review Criteria for review not met SLN   Basic Metabolic Panel   Result: 07/25/2012 1:23 PM ( Status: F )   Sodium 140 135-145 mEq/L SLN   Potassium 4.6 3.5-5.3 mEq/L SLN   Chloride 105 96-112 mEq/L SLN   CO2 29 19-32 mEq/L SLN   Glucose 97 70-99 mg/dL SLN   BUN 23 3-08 mg/dL SLN   Creatinine 6.57 8.46-9.62 mg/dL SLN   Calcium 9.2 9.5-28.4 mg/dL SLN     Assessment/Plan    1. Weight loss conts with weight loss despite medication and supplements; this makes wound healing poor. Staff to encourage pt however due to pts age and memory loss weight loss is expected   2. CVA (cerebrovascular accident) No changes in CVA  3. Hypertension Stable on current medications

## 2012-11-06 DIAGNOSIS — L89309 Pressure ulcer of unspecified buttock, unspecified stage: Secondary | ICD-10-CM

## 2012-11-25 ENCOUNTER — Encounter (HOSPITAL_BASED_OUTPATIENT_CLINIC_OR_DEPARTMENT_OTHER): Payer: Medicare Other | Attending: General Surgery

## 2012-11-25 DIAGNOSIS — L8990 Pressure ulcer of unspecified site, unspecified stage: Secondary | ICD-10-CM | POA: Insufficient documentation

## 2012-11-25 DIAGNOSIS — L89309 Pressure ulcer of unspecified buttock, unspecified stage: Secondary | ICD-10-CM | POA: Insufficient documentation

## 2012-11-25 DIAGNOSIS — L8992 Pressure ulcer of unspecified site, stage 2: Secondary | ICD-10-CM | POA: Insufficient documentation

## 2012-11-26 NOTE — Progress Notes (Signed)
[facility]  NAME:  [name], [name]             ACCOUNT NO.:  [id number]  MEDICAL RECORD NO.:  [id number]      DATE OF BIRTH:  [birth year]  PHYSICIAN:  Ardath Sax, M.D.           VISIT DATE:                                  OFFICE VISIT   [name] is a 79 year old delightful lady who comes to [place] because of a small less than 1 cm pressure ulcer on her right buttock that I would called as stage II.  She also has on the tip of her left great toe, a 4- mm ulcer presumably from the sheaths rubbing her tip of her left great toe.  I cleaned these up and I am going to treat them with silver alginate.  I do not think these are going to take very long to heal. They are just stage II and very small and the lady is healthy.  She eats well.  She does not walk, however, and spends a lot of time in the wheelchair.  Her vital signs were totally normal when she was examined.  Blood pressure 126/78, respirations 16, pulse 65, temperature 97.8, she only weighs 99.8 pounds.  The only medicine that she has on is calcium, vitamins, Tylenol and magnesium hydroxide when needed.  Her heart and lungs are fine.  Her abdomen is soft.  The ulcer on her buttocks, we treated with offloading it with a DuoDERM with silver alginate.  We left instructions to the nursing home how to handle this and if they have any trouble, they will send her back.  DIAGNOSIS:  Pressure ulcer, right buttock and left great toe, stage II.     Ardath Sax, M.D.     PP/MEDQ  D:  11/25/2012  T:  11/26/2012  Job:  [id number]

## 2012-12-04 ENCOUNTER — Encounter (HOSPITAL_COMMUNITY): Payer: Self-pay

## 2012-12-04 ENCOUNTER — Emergency Department (HOSPITAL_COMMUNITY): Payer: Medicare Other

## 2012-12-04 ENCOUNTER — Inpatient Hospital Stay (HOSPITAL_COMMUNITY)
Admission: EM | Admit: 2012-12-04 | Discharge: 2012-12-07 | DRG: 178 | Disposition: A | Discharge status: Skilled Nursing Facility | Payer: Medicare Other | Attending: Internal Medicine | Admitting: Internal Medicine

## 2012-12-04 DIAGNOSIS — F0150 Vascular dementia without behavioral disturbance: Secondary | ICD-10-CM | POA: Diagnosis present

## 2012-12-04 DIAGNOSIS — N390 Urinary tract infection, site not specified: Secondary | ICD-10-CM | POA: Diagnosis present

## 2012-12-04 DIAGNOSIS — E860 Dehydration: Secondary | ICD-10-CM | POA: Diagnosis present

## 2012-12-04 DIAGNOSIS — D72829 Elevated white blood cell count, unspecified: Secondary | ICD-10-CM | POA: Diagnosis present

## 2012-12-04 DIAGNOSIS — B9689 Other specified bacterial agents as the cause of diseases classified elsewhere: Secondary | ICD-10-CM | POA: Diagnosis present

## 2012-12-04 DIAGNOSIS — G459 Transient cerebral ischemic attack, unspecified: Secondary | ICD-10-CM

## 2012-12-04 DIAGNOSIS — Z8673 Personal history of transient ischemic attack (TIA), and cerebral infarction without residual deficits: Secondary | ICD-10-CM

## 2012-12-04 DIAGNOSIS — R531 Weakness: Secondary | ICD-10-CM

## 2012-12-04 DIAGNOSIS — I4891 Unspecified atrial fibrillation: Secondary | ICD-10-CM

## 2012-12-04 DIAGNOSIS — R509 Fever, unspecified: Secondary | ICD-10-CM

## 2012-12-04 DIAGNOSIS — Z9181 History of falling: Secondary | ICD-10-CM

## 2012-12-04 DIAGNOSIS — R634 Abnormal weight loss: Secondary | ICD-10-CM

## 2012-12-04 DIAGNOSIS — I639 Cerebral infarction, unspecified: Secondary | ICD-10-CM | POA: Diagnosis present

## 2012-12-04 DIAGNOSIS — R319 Hematuria, unspecified: Secondary | ICD-10-CM

## 2012-12-04 DIAGNOSIS — G8929 Other chronic pain: Secondary | ICD-10-CM | POA: Diagnosis present

## 2012-12-04 DIAGNOSIS — I340 Nonrheumatic mitral (valve) insufficiency: Secondary | ICD-10-CM

## 2012-12-04 DIAGNOSIS — Z7982 Long term (current) use of aspirin: Secondary | ICD-10-CM

## 2012-12-04 DIAGNOSIS — I509 Heart failure, unspecified: Secondary | ICD-10-CM | POA: Diagnosis present

## 2012-12-04 DIAGNOSIS — I059 Rheumatic mitral valve disease, unspecified: Secondary | ICD-10-CM | POA: Diagnosis present

## 2012-12-04 DIAGNOSIS — Z79899 Other long term (current) drug therapy: Secondary | ICD-10-CM

## 2012-12-04 DIAGNOSIS — L89109 Pressure ulcer of unspecified part of back, unspecified stage: Secondary | ICD-10-CM | POA: Diagnosis present

## 2012-12-04 DIAGNOSIS — I471 Supraventricular tachycardia: Secondary | ICD-10-CM

## 2012-12-04 DIAGNOSIS — I672 Cerebral atherosclerosis: Secondary | ICD-10-CM | POA: Diagnosis present

## 2012-12-04 DIAGNOSIS — R627 Adult failure to thrive: Secondary | ICD-10-CM | POA: Diagnosis present

## 2012-12-04 DIAGNOSIS — H9190 Unspecified hearing loss, unspecified ear: Secondary | ICD-10-CM | POA: Diagnosis present

## 2012-12-04 DIAGNOSIS — E039 Hypothyroidism, unspecified: Secondary | ICD-10-CM

## 2012-12-04 DIAGNOSIS — M549 Dorsalgia, unspecified: Secondary | ICD-10-CM | POA: Diagnosis present

## 2012-12-04 DIAGNOSIS — I10 Essential (primary) hypertension: Secondary | ICD-10-CM | POA: Diagnosis present

## 2012-12-04 DIAGNOSIS — J690 Pneumonitis due to inhalation of food and vomit: Principal | ICD-10-CM | POA: Diagnosis present

## 2012-12-04 DIAGNOSIS — M1990 Unspecified osteoarthritis, unspecified site: Secondary | ICD-10-CM | POA: Diagnosis present

## 2012-12-04 DIAGNOSIS — Z8679 Personal history of other diseases of the circulatory system: Secondary | ICD-10-CM

## 2012-12-04 DIAGNOSIS — L8990 Pressure ulcer of unspecified site, unspecified stage: Secondary | ICD-10-CM | POA: Diagnosis present

## 2012-12-04 DIAGNOSIS — Z66 Do not resuscitate: Secondary | ICD-10-CM | POA: Diagnosis present

## 2012-12-04 DIAGNOSIS — I739 Peripheral vascular disease, unspecified: Secondary | ICD-10-CM | POA: Diagnosis present

## 2012-12-04 DIAGNOSIS — J209 Acute bronchitis, unspecified: Secondary | ICD-10-CM

## 2012-12-04 DIAGNOSIS — L89609 Pressure ulcer of unspecified heel, unspecified stage: Secondary | ICD-10-CM | POA: Diagnosis present

## 2012-12-04 DIAGNOSIS — E785 Hyperlipidemia, unspecified: Secondary | ICD-10-CM | POA: Diagnosis present

## 2012-12-04 DIAGNOSIS — M810 Age-related osteoporosis without current pathological fracture: Secondary | ICD-10-CM | POA: Diagnosis present

## 2012-12-04 DIAGNOSIS — I5032 Chronic diastolic (congestive) heart failure: Secondary | ICD-10-CM

## 2012-12-04 LAB — CBC
HCT: 42.9 % (ref 36.0–46.0)
Hemoglobin: 14.3 g/dL (ref 12.0–15.0)
MCH: 29.2 pg (ref 26.0–34.0)
MCHC: 33.3 g/dL (ref 30.0–36.0)
MCV: 87.7 fL (ref 78.0–100.0)

## 2012-12-04 LAB — URINALYSIS, ROUTINE W REFLEX MICROSCOPIC
Bilirubin Urine: NEGATIVE
Glucose, UA: NEGATIVE mg/dL
Protein, ur: 100 mg/dL — AB

## 2012-12-04 LAB — URINE MICROSCOPIC-ADD ON

## 2012-12-04 LAB — BASIC METABOLIC PANEL
BUN: 21 mg/dL (ref 6–23)
Calcium: 8.9 mg/dL (ref 8.4–10.5)
Creatinine, Ser: 0.64 mg/dL (ref 0.50–1.10)
GFR calc non Af Amer: 70 mL/min — ABNORMAL LOW (ref >90)
Glucose, Bld: 114 mg/dL — ABNORMAL HIGH (ref 70–99)
Potassium: 4.8 mEq/L (ref 3.5–5.1)

## 2012-12-04 LAB — TROPONIN I: Troponin I: <0.3 ng/mL (ref <0.30)

## 2012-12-04 MED ORDER — ASPIRIN 325 MG PO TABS
325.0000 mg | ORAL_TABLET | Freq: Every day | ORAL | Status: DC
  Administered 2012-12-05 – 2012-12-07 (×3): 325 mg via ORAL
  Filled 2012-12-04 (×3): qty 1

## 2012-12-04 MED ORDER — METOPROLOL TARTRATE 12.5 MG HALF TABLET
12.5000 mg | ORAL_TABLET | Freq: Two times a day (BID) | ORAL | Status: DC
  Administered 2012-12-05: 12.5 mg via ORAL
  Filled 2012-12-04 (×2): qty 1

## 2012-12-04 MED ORDER — PIPERACILLIN-TAZOBACTAM 3.375 G IVPB 30 MIN
3.3750 g | Freq: Once | INTRAVENOUS | Status: AC
  Administered 2012-12-05: 3.375 g via INTRAVENOUS
  Filled 2012-12-04: qty 50

## 2012-12-04 MED ORDER — SODIUM CHLORIDE 0.9 % IV BOLUS (SEPSIS)
500.0000 mL | Freq: Once | INTRAVENOUS | Status: AC
  Administered 2012-12-04: 500 mL via INTRAVENOUS

## 2012-12-04 MED ORDER — PIPERACILLIN-TAZOBACTAM 3.375 G IVPB
3.3750 g | Freq: Three times a day (TID) | INTRAVENOUS | Status: DC
  Administered 2012-12-05 – 2012-12-07 (×7): 3.375 g via INTRAVENOUS
  Filled 2012-12-04 (×9): qty 50

## 2012-12-04 MED ORDER — HEPARIN SODIUM (PORCINE) 5000 UNIT/ML IJ SOLN
5000.0000 [IU] | Freq: Three times a day (TID) | INTRAMUSCULAR | Status: DC
  Administered 2012-12-05 – 2012-12-07 (×8): 5000 [IU] via SUBCUTANEOUS
  Filled 2012-12-04 (×10): qty 1

## 2012-12-04 MED ORDER — PANTOPRAZOLE SODIUM 40 MG PO TBEC
40.0000 mg | DELAYED_RELEASE_TABLET | Freq: Every day | ORAL | Status: DC
  Administered 2012-12-05 – 2012-12-07 (×3): 40 mg via ORAL
  Filled 2012-12-04 (×5): qty 1

## 2012-12-04 MED ORDER — LEVOTHYROXINE SODIUM 25 MCG PO TABS
25.0000 ug | ORAL_TABLET | Freq: Every day | ORAL | Status: DC
  Administered 2012-12-06 – 2012-12-07 (×2): 25 ug via ORAL
  Filled 2012-12-04 (×5): qty 1

## 2012-12-04 MED ORDER — VANCOMYCIN HCL 500 MG IV SOLR
500.0000 mg | INTRAVENOUS | Status: DC
  Administered 2012-12-05 – 2012-12-06 (×2): 500 mg via INTRAVENOUS
  Filled 2012-12-04 (×3): qty 500

## 2012-12-04 MED ORDER — DEXTROSE 5 % IV SOLN
1.0000 g | Freq: Once | INTRAVENOUS | Status: AC
  Administered 2012-12-04: 1 g via INTRAVENOUS
  Filled 2012-12-04: qty 10

## 2012-12-04 MED ORDER — DEXTROSE 5 % IV SOLN
500.0000 mg | INTRAVENOUS | Status: DC
  Administered 2012-12-05: 500 mg via INTRAVENOUS
  Filled 2012-12-04 (×2): qty 500

## 2012-12-04 MED ORDER — DEXTROSE 5 % IV SOLN
500.0000 mg | Freq: Once | INTRAVENOUS | Status: AC
  Administered 2012-12-04: 500 mg via INTRAVENOUS
  Filled 2012-12-04: qty 500

## 2012-12-04 MED ORDER — ATORVASTATIN CALCIUM 10 MG PO TABS
10.0000 mg | ORAL_TABLET | Freq: Every day | ORAL | Status: DC
  Administered 2012-12-05 – 2012-12-07 (×3): 10 mg via ORAL
  Filled 2012-12-04 (×3): qty 1

## 2012-12-04 NOTE — ED Notes (Signed)
Per [facility], pt from [facility] for stroke like symptoms witnessed by daughter at 54. Daughter reported that she was leaning to the right side and it was flaccid. She was also not able to verbalize anything to her. EMS arrived at 1754 and reported that her grips were equal, no droop was present, no pronator drift. Marland Kitchen Hx of afib and stroke.

## 2012-12-04 NOTE — Progress Notes (Signed)
ANTIBIOTIC CONSULT NOTE - INITIAL  Pharmacy Consult for Vancocin and Zosyn Indication: rule out pneumonia  No Known Allergies  Patient Measurements: Weight: 48kg  Vital Signs: Temp: 98.2 F (36.8 C) (09/12 2023) Temp src: Oral (09/12 2023) BP: 128/46 mmHg (09/12 2200) Pulse Rate: 35 (09/12 2200)  Labs:  Recent Labs  12/04/12 2039  WBC 11.5*  HGB 14.3  PLT 201  CREATININE 0.64     Microbiology: No results found for this or any previous visit (from the past 720 hour(s)).  Medical History: Past Medical History  Diagnosis Date  . Mitral insufficiency     CHRONIC  . Hypertension   . Debility     GENERALIZED  . Hyperlipidemia   . OA (osteoarthritis)   . OP (osteoporosis)   . CHF (congestive heart failure)   . History of diastolic dysfunction   . Muscle weakness (generalized)   . Difficulty walking   . Metabolic encephalopathy   . Hypothyroidism   . HOH (hard of hearing)     "extremely"  . SVT (supraventricular tachycardia)     nonsustained  . Heart murmur   . Repeated falls 2010    "3 serious falls; hit head each time; staples 1st 2 times"  . Vascular dementia dx'd 2011  . Peripheral vascular disease   . Dry cough 03/26/11    "chronic; dx'd years ago as allergy related type of thing"  . Bruises easily   . Chronic back pain greater than 3 months duration   . CVA (cerebrovascular accident) 03/2006; 04/2009    residual "speech problems & short term memory decreased"  . PNA (pneumonia)   . UTI (lower urinary tract infection)     Medications:  Prescriptions prior to admission  Medication Sig Dispense Refill  . aspirin 325 MG tablet Take 1 tablet (325 mg total) by mouth daily.  1 tablet  1  . atorvastatin (LIPITOR) 10 MG tablet Take 10 mg by mouth daily.      . Calcium Carbonate-Vitamin D (CALCIUM 600 + D PO) Take 1 tablet by mouth 2 (two) times daily.        . Cranberry 425 MG CAPS Take 1 capsule by mouth 2 (two) times daily.      Marland Kitchen levothyroxine (SYNTHROID,  LEVOTHROID) 25 MCG tablet Take 25 mcg by mouth daily.        . metoprolol succinate (TOPROL-XL) 25 MG 24 hr tablet Take 12.5 mg by mouth daily.       Marland Kitchen omeprazole (PRILOSEC) 20 MG capsule Take 20 mg by mouth daily.      . polyethylene glycol (MIRALAX / GLYCOLAX) packet Take 17 g by mouth every other day. As needed for constipation.      . sulfamethoxazole-trimethoprim (BACTRIM DS) 800-160 MG per tablet Take 1 tablet by mouth 2 (two) times daily. For 10 days       Scheduled:  . [START ON 12/05/2012] aspirin  325 mg Oral Daily  . [START ON 12/05/2012] atorvastatin  10 mg Oral Daily  . azithromycin  500 mg Intravenous Once  . azithromycin  500 mg Intravenous Q24H  . heparin  5,000 Units Subcutaneous Q8H  . [START ON 12/05/2012] levothyroxine  25 mcg Oral Daily  . [START ON 12/05/2012] metoprolol tartrate  12.5 mg Oral BID  . [START ON 12/05/2012] pantoprazole  40 mg Oral Daily    Assessment: 79yo female c/o weakness x3-5hr, no SOB though CXR reveals opacity, to begin IV ABX empirically.  Goal of Therapy:  Vancomycin trough level 15-20 mcg/ml  Plan:  Will begin vancomycin 500mg  IV Q24H and Zosyn 3.375g IV Q8H and monitor CBC, Cx, levels prn.  Vernard Gambles, PharmD, BCPS  12/04/2012,11:29 PM

## 2012-12-04 NOTE — ED Provider Notes (Signed)
CSN: [id number]     Arrival date & time 12/04/12  1822 History   First MD Initiated Contact with Patient 12/04/12 1824     Chief Complaint  Patient presents with  . Stroke Symptoms   (Consider location/radiation/quality/duration/timing/severity/associated sxs/prior Treatment) HPI Comments: Weakness noted by daughter - bilateral arm weakness Hx of multiple CVA - tPA previously when she wa 79 years old.  Patient is a 79 y.o. female presenting with weakness. The history is provided by the patient.  Weakness This is a new problem. The current episode started 3 to 5 hours ago. The problem occurs constantly. The problem has not changed since onset.Pertinent negatives include no shortness of breath. Nothing aggravates the symptoms. Nothing relieves the symptoms. She has tried nothing for the symptoms. The treatment provided no relief.    Past Medical History  Diagnosis Date  . Mitral insufficiency     CHRONIC  . Hypertension   . Debility     GENERALIZED  . Hyperlipidemia   . OA (osteoarthritis)   . OP (osteoporosis)   . CHF (congestive heart failure)   . History of diastolic dysfunction   . Muscle weakness (generalized)   . Difficulty walking   . Metabolic encephalopathy   . Hypothyroidism   . HOH (hard of hearing)     "extremely"  . SVT (supraventricular tachycardia)     nonsustained  . Heart murmur   . Repeated falls 2010    "3 serious falls; hit head each time; staples 1st 2 times"  . Vascular dementia dx'd 2011  . Peripheral vascular disease   . Dry cough 03/26/11    "chronic; dx'd years ago as allergy related type of thing"  . Bruises easily   . Chronic back pain greater than 3 months duration   . CVA (cerebrovascular accident) 03/2006; 04/2009    residual "speech problems & short term memory decreased"  . PNA (pneumonia)   . UTI (lower urinary tract infection)    Past Surgical History  Procedure Laterality Date  . Removal colon polpys  12/2000    Villous adenoma of  the cecum.  . Tonsillectomy and adenoidectomy    . Appendectomy    . Cataract extraction w/ intraocular lens  implant, bilateral  1984  . Dilation and curettage of uterus  1960's    "several"   Family History  Problem Relation Age of Onset  . Heart disease Mother    History  Substance Use Topics  . Smoking status: Never Smoker   . Smokeless tobacco: Never Used  . Alcohol Use: No   OB History   Grav Para Term Preterm Abortions TAB SAB Ect Mult Living                 Review of Systems  Constitutional: Negative for fever.  Respiratory: Negative for cough and shortness of breath.   Neurological: Positive for weakness.  All other systems reviewed and are negative.    Allergies  Review of patient's allergies indicates no known allergies.  Home Medications   Current Outpatient Rx  Name  Route  Sig  Dispense  Refill  . aspirin 325 MG tablet   Oral   Take 1 tablet (325 mg total) by mouth daily.   1 tablet   1   . atorvastatin (LIPITOR) 10 MG tablet   Oral   Take 10 mg by mouth daily.         . Calcium Carbonate-Vitamin D (CALCIUM 600 + D PO)   Oral  Take 1 tablet by mouth 2 (two) times daily.           . Cranberry 425 MG CAPS   Oral   Take 1 capsule by mouth 2 (two) times daily.         Marland Kitchen levothyroxine (SYNTHROID, LEVOTHROID) 25 MCG tablet   Oral   Take 25 mcg by mouth daily.           . metoprolol succinate (TOPROL-XL) 25 MG 24 hr tablet   Oral   Take 12.5 mg by mouth daily.          Marland Kitchen omeprazole (PRILOSEC) 20 MG capsule   Oral   Take 20 mg by mouth daily.         . polyethylene glycol (MIRALAX / GLYCOLAX) packet   Oral   Take 17 g by mouth every other day. As needed for constipation.         . sulfamethoxazole-trimethoprim (BACTRIM DS) 800-160 MG per tablet   Oral   Take 1 tablet by mouth 2 (two) times daily. For 10 days          BP 140/73  Pulse 71  Temp(Src) 98.8 F (37.1 C) (Oral)  Resp 24  SpO2 95% Physical Exam  Nursing  note and vitals reviewed. Constitutional: She appears well-developed and well-nourished. No distress.  HENT:  Head: Normocephalic and atraumatic.  Mucus membranes tacky  Eyes: EOM are normal. Pupils are equal, round, and reactive to light.  Neck: Normal range of motion. Neck supple.  Cardiovascular: Normal rate.  An irregularly irregular rhythm present. Exam reveals no friction rub.   No murmur heard. Pulmonary/Chest: Effort normal and breath sounds normal. No respiratory distress. She has no wheezes. She has no rales.  Abdominal: Soft. She exhibits no distension. There is no tenderness. There is no rebound.  Musculoskeletal: Normal range of motion. She exhibits no edema.  Neurological: She is alert. No cranial nerve deficit. She exhibits normal muscle tone.  Due to age and prior CVAs, difficult to cooperate with Neuro exam.   Skin: No rash noted. She is not diaphoretic.    ED Course  Procedures (including critical care time) Labs Review Labs Reviewed  CBC  BASIC METABOLIC PANEL  LACTIC ACID, PLASMA  URINALYSIS, ROUTINE W REFLEX MICROSCOPIC  TROPONIN I  PRO B NATRIURETIC PEPTIDE   Imaging Review Dg Chest Port 1 View  12/04/2012   CLINICAL DATA:  Weakness, congestive heart failure  EXAM: PORTABLE CHEST - 1 VIEW  COMPARISON:  03/10/2012  FINDINGS: Cardiomegaly again noted. Small left pleural effusion with left basilar atelectasis or infiltrate. No convincing pulmonary edema. The patient is rotated to the right. Diffuse osteopenia is noted.  IMPRESSION: Cardiomegaly. No pulmonary edema. Small left pleural effusion with left basilar atelectasis or infiltrate.   Electronically Signed   By: Natasha Mead   On: 12/04/2012 19:09    Date: 12/04/2012  Rate: 73  Rhythm: atrial fibrillation  QRS Axis: left  Intervals: QTc prolonged  ST/T Wave abnormalities: normal  Conduction Disutrbances:right bundle branch block  Narrative Interpretation:   Old EKG Reviewed: unchanged   MDM   1. UTI  (urinary tract infection)     79 year old female with history of stroke presents with diffuse weakness. About a daughter. No recent fevers. Our concern for possible stroke. Initial sugar normal with EMS and no focal deficits noted by EMS. On arrival, vitals are stable here. Patient is following questions intermittently. Daughter states this is her baseline. She denies any  abdominal pain chest pain or shortness of breath. Her mucous membranes appear fairly dry. She has had no history of UTIs. She has no focal weakness, but diffuse weakness in arms and legs. She has no normal cranial nerves. Concern for possible infectious source. We'll scan her head to look for possible stroke or TIA. We will also do labs look for infection. Urine with UTI - lactate elevated mildly - will hydrate and give rocephin. Patient has mild O2 requirement and possible infiltrate on CXR. Will give azithromycin for CAP coverage. I spoke with medicine about these antibiotics and they stated it's fine for now. Family amenable to admission.    Dagmar Hait, MD 12/04/12 2252

## 2012-12-04 NOTE — H&P (Signed)
[facility] History and Physical  Patient: Julie Frank  YQM:[id number]  DOB: 1909/11/19  DOA: 12/04/2012  Referring physician: Dr. Gwendolyn Grant PCP: Kimber Relic, MD  Consults:     Chief Complaint: Generalized weakness  HPI: Julie Frank is a 79 y.o. female with Past medical history of recurrent UTI, CHF, hypertension, recurrent CVA, failure to thrive, hypothyroidism. The patient is a resident of [facility]. Today she was found by her daughter around afternoon sitting in a chair with her mouth open not moving her extremities. The daughter called the nursing staff who evaluated the patient and patient was found to have generalized weakness without any focal neurological deficit. The daughter requested the patient to be transferred to ER.  As per the patient she denies any complaint of chest pain, nausea, shortness of breath, abdominal pain. As per the daughter the patient has not been gaining weight, and actually losing weight since last one week. There is no new swelling in the leg, the patient did not have any fall or trauma.  The patient has history of urinary incontinence. There is no diarrhea noted by the daughter since last few days.  Review of Systems: as mentioned in the history of present illness.  A Comprehensive review of the other systems is negative.  Past Medical History  Diagnosis Date  . Mitral insufficiency     CHRONIC  . Hypertension   . Debility     GENERALIZED  . Hyperlipidemia   . OA (osteoarthritis)   . OP (osteoporosis)   . CHF (congestive heart failure)   . History of diastolic dysfunction   . Muscle weakness (generalized)   . Difficulty walking   . Metabolic encephalopathy   . Hypothyroidism   . HOH (hard of hearing)     "extremely"  . SVT (supraventricular tachycardia)     nonsustained  . Heart murmur   . Repeated falls 2010    "3 serious falls; hit head each time; staples 1st 2 times"  . Vascular dementia dx'd 2011  .  Peripheral vascular disease   . Dry cough 03/26/11    "chronic; dx'd years ago as allergy related type of thing"  . Bruises easily   . Chronic back pain greater than 3 months duration   . CVA (cerebrovascular accident) 03/2006; 04/2009    residual "speech problems & short term memory decreased"  . PNA (pneumonia)   . UTI (lower urinary tract infection)    Past Surgical History  Procedure Laterality Date  . Removal colon polpys  12/2000    Villous adenoma of the cecum.  . Tonsillectomy and adenoidectomy    . Appendectomy    . Cataract extraction w/ intraocular lens  implant, bilateral  1984  . Dilation and curettage of uterus  1960's    "several"   Social History:  reports that she has never smoked. She has never used smokeless tobacco. She reports that she does not drink alcohol or use illicit drugs. Patient is coming from SNF. Partially Independent for most of her  ADL.  No Known Allergies  Family History  Problem Relation Age of Onset  . Heart disease Mother     Prior to Admission medications   Medication Sig Start Date End Date Taking? Authorizing Provider  aspirin 325 MG tablet Take 1 tablet (325 mg total) by mouth daily. 03/29/11 12/04/12 Yes Leroy Sea, MD  atorvastatin (LIPITOR) 10 MG tablet Take 10 mg by mouth daily.   Yes Historical Provider, MD  Calcium Carbonate-Vitamin D (CALCIUM 600 + D PO) Take 1 tablet by mouth 2 (two) times daily.     Yes Historical Provider, MD  Cranberry 425 MG CAPS Take 1 capsule by mouth 2 (two) times daily.   Yes Historical Provider, MD  levothyroxine (SYNTHROID, LEVOTHROID) 25 MCG tablet Take 25 mcg by mouth daily.     Yes Historical Provider, MD  metoprolol succinate (TOPROL-XL) 25 MG 24 hr tablet Take 12.5 mg by mouth daily.    Yes Historical Provider, MD  omeprazole (PRILOSEC) 20 MG capsule Take 20 mg by mouth daily.   Yes Historical Provider, MD  polyethylene glycol (MIRALAX / GLYCOLAX) packet Take 17 g by mouth every other day. As  needed for constipation.   Yes Historical Provider, MD  sulfamethoxazole-trimethoprim (BACTRIM DS) 800-160 MG per tablet Take 1 tablet by mouth 2 (two) times daily. For 10 days   Yes Historical Provider, MD    Physical Exam: Filed Vitals:   12/04/12 2045 12/04/12 2130 12/04/12 2145 12/04/12 2200  BP: 141/50 118/49 97/77 128/46  Pulse: 36 36 35 35  Temp:      TempSrc:      Resp: 24 20 23 31   SpO2: 100% 97% 96% 96%    General: Alert, Awake and Oriented to Time, Place and Person. Appear in mild distress Eyes: PERRL ENT: Oral Mucosa clear dry. Neck: No JVD, no Carotid Bruits  Cardiovascular: S1 and S2 Present, aortic systolic Murmur, Peripheral Pulses Present Respiratory: Bilateral Air entry equal and Decreased, bibasilar Crackles, no wheezes Abdomen: Bowel Sound Present, Soft and Non tender Skin: No Rash Extremities: No Pedal edema, no calf tenderness Neurologic: Grossly Unremarkable.  Labs on Admission:  CBC:  Recent Labs Lab 12/04/12 2039  WBC 11.5*  HGB 14.3  HCT 42.9  MCV 87.7  PLT 201    CMP     Component Value Date/Time   NA 138 12/04/2012 2039   K 4.8 12/04/2012 2039   CL 105 12/04/2012 2039   CO2 21 12/04/2012 2039   GLUCOSE 114* 12/04/2012 2039   BUN 21 12/04/2012 2039   CREATININE 0.64 12/04/2012 2039   CALCIUM 8.9 12/04/2012 2039   PROT 6.9 03/10/2012 1335   ALBUMIN 3.3* 03/10/2012 1335   AST 29 03/10/2012 1335   ALT 12 03/10/2012 1335   ALKPHOS 63 03/10/2012 1335   BILITOT 0.2* 03/10/2012 1335   GFRNONAA 70* 12/04/2012 2039   GFRAA 82* 12/04/2012 2039    No results found for this basename: LIPASE, AMYLASE,  in the last 168 hours No results found for this basename: AMMONIA,  in the last 168 hours  Cardiac Enzymes:  Recent Labs Lab 12/04/12 2039  TROPONINI <0.30    BNP (last 3 results)  Recent Labs  12/04/12 2039  PROBNP 14051.0*    Radiological Exams on Admission: Ct Head Wo Contrast  12/04/2012   CLINICAL DATA:  Stroke-like symptoms   EXAM: CT HEAD WITHOUT CONTRAST  TECHNIQUE: Contiguous axial images were obtained from the base of the skull through the vertex without intravenous contrast.  COMPARISON:  03/10/2012  FINDINGS: No intracranial hemorrhage, mass effect or midline shift. No skull fracture is noted. Paranasal sinuses and mastoid air cells are unremarkable.  Again noted stable atrophy. Extensive periventricular and patchy subcortical white matter decreased attenuation consistent with chronic small vessel ischemic changes again noted. No definite acute cortical infarction. No mass lesion is noted on this unenhanced scan.  IMPRESSION: No acute intracranial abnormality. Stable cerebral atrophy. No definite acute cortical  infarction. Extensive periventricular and patchy subcortical white matter decreased attenuation probable due to chronic small vessel ischemic changes. If there is high clinical suspicion for acute infarction follow-up MRI with diffusion imaging is recommended.   Electronically Signed   By: Natasha Mead   On: 12/04/2012 19:55   Dg Chest Port 1 View  12/04/2012   CLINICAL DATA:  Weakness, congestive heart failure  EXAM: PORTABLE CHEST - 1 VIEW  COMPARISON:  03/10/2012  FINDINGS: Cardiomegaly again noted. Small left pleural effusion with left basilar atelectasis or infiltrate. No convincing pulmonary edema. The patient is rotated to the right. Diffuse osteopenia is noted.  IMPRESSION: Cardiomegaly. No pulmonary edema. Small left pleural effusion with left basilar atelectasis or infiltrate.   Electronically Signed   By: Natasha Mead   On: 12/04/2012 19:09    EKG: Independently reviewed. sinus tachycardia.  Assessment/Plan Principal Problem:   UTI (lower urinary tract infection) Active Problems:   CVA (cerebrovascular accident)   Hypertension   Chronic diastolic CHF (congestive heart failure)   Aspiration pneumonia   1. UTI (lower urinary tract infection) The patient initially presented with generalized weakness.  She had a CT scan which was negative for any acute abnormality. She does not have any significant focal neurological deficit. Her urine is positive for significant leukocytosis and she has history of recurrent UTI and was on Bactrim for suppressive therapy. Considering her history of enterococcus and Escherichia coli, and the fact that she is from her skilled nursing facility we will cover her broadly with IV vancomycin and Zosyn. There is also a possibility of aspiration pneumonia based on the chest x-ray. Blood cultures are ordered, urine and sputum culture will be obtain.  2. Chronic diastolic CHF The patient has elevated anti-proBNP. But on examination she appears dehydrated. The fact that she has UTI and possible pneumonia and initially was presented with hypotension, the anti-proBNP could be elevated secondary to systemic infection. Due to this I would gently hydrate the patient and monitor her oxygen level and monitor on telemetry.  3. Hypertension Holding her antihypertensive at present in view of borderline hypertension.  4. Aspiration pneumonia IV Vanco and Zosyn, speech therapy consult. N.p.o. at present except meds.  DVT Prophylaxis: subcutaneous Heparin Nutrition: N.p.o.  Code Status: DNR/DNI as per the discussion with the daughter  Family Communication: The daughter was present at bedside, opportunity was given to the family to ask question and all questions were answered satisfactorily at the time of interview. Disposition: Admitted to inpatient in telemetry.  Author: Lynden Oxford, MD [facility] Pager: [phone] 12/04/2012, 11:00 PM    If 7PM-7AM, please contact night-coverage [email or web address] Password [email or web address]

## 2012-12-05 ENCOUNTER — Encounter (HOSPITAL_COMMUNITY): Payer: Self-pay | Admitting: *Deleted

## 2012-12-05 DIAGNOSIS — I509 Heart failure, unspecified: Secondary | ICD-10-CM

## 2012-12-05 DIAGNOSIS — I10 Essential (primary) hypertension: Secondary | ICD-10-CM

## 2012-12-05 DIAGNOSIS — J690 Pneumonitis due to inhalation of food and vomit: Principal | ICD-10-CM

## 2012-12-05 DIAGNOSIS — N390 Urinary tract infection, site not specified: Secondary | ICD-10-CM

## 2012-12-05 DIAGNOSIS — I5032 Chronic diastolic (congestive) heart failure: Secondary | ICD-10-CM

## 2012-12-05 LAB — CBC WITH DIFFERENTIAL/PLATELET
Basophils Absolute: 0 10*3/uL (ref 0.0–0.1)
Eosinophils Relative: 0 % (ref 0–5)
HCT: 40.5 % (ref 36.0–46.0)
Lymphocytes Relative: 5 % — ABNORMAL LOW (ref 12–46)
MCH: 28.2 pg (ref 26.0–34.0)
MCV: 88.6 fL (ref 78.0–100.0)
Monocytes Absolute: 0.8 10*3/uL (ref 0.1–1.0)
RDW: 16.8 % — ABNORMAL HIGH (ref 11.5–15.5)
WBC: 11.1 10*3/uL — ABNORMAL HIGH (ref 4.0–10.5)

## 2012-12-05 LAB — COMPREHENSIVE METABOLIC PANEL
AST: 111 U/L — ABNORMAL HIGH (ref 0–37)
CO2: 20 mEq/L (ref 19–32)
Calcium: 8.6 mg/dL (ref 8.4–10.5)
Creatinine, Ser: 0.69 mg/dL (ref 0.50–1.10)
GFR calc Af Amer: 80 mL/min — ABNORMAL LOW (ref >90)
GFR calc non Af Amer: 69 mL/min — ABNORMAL LOW (ref >90)
Glucose, Bld: 130 mg/dL — ABNORMAL HIGH (ref 70–99)

## 2012-12-05 LAB — PROTIME-INR: INR: 1.43 (ref 0.00–1.49)

## 2012-12-05 LAB — PRO B NATRIURETIC PEPTIDE: Pro B Natriuretic peptide (BNP): 14473 pg/mL — ABNORMAL HIGH (ref 0–450)

## 2012-12-05 MED ORDER — ENSURE COMPLETE PO LIQD
237.0000 mL | Freq: Two times a day (BID) | ORAL | Status: DC
  Administered 2012-12-05 – 2012-12-07 (×4): 237 mL via ORAL

## 2012-12-05 NOTE — Progress Notes (Signed)
[facility] PROGRESS NOTE  Julie Frank ZOX:[id number] DOB: 06/27/1909 DOA: 12/04/2012 PCP: Kimber Relic, MD  Assessment/Plan: Principal Problem:   UTI (lower urinary tract infection) Active Problems:   CVA (cerebrovascular accident)   Hypertension   Chronic diastolic CHF (congestive heart failure)   Aspiration pneumonia    1. UTI (lower urinary tract infection): Patient initially presented with generalized weakness. Head CT scan was negative for acute abnormality, and she had no focal neurological deficit. She was afebrile, but wcc was mildly elevated at 11.5. U/A revealed pyuria, suspicious for UTI, in the setting of known history of recurrent UTIs and suppressive Bactrim therapy. Based on previous bacteriologic data, managing with iv Zosyn, now day #2. Awaiting urine cultures.  2. Query HCAP vs Aspiration Pneumonia: CXR revealed a small left pleural effusion with left basilar atelectasis or infiltrate, suggestive of HCAP vs aspiration, although patient has no cough, or shortness of breath. Blood cultures are pending. Managing with Vancomycin/Zosyn, now day #2.  3. Chronic diastolic CHF: Patient has known history of chronic diastolic CHF, EF 81% to 55%, per 2D Echocardiogram 02/2012. Clinically, she has no evidence of CHF decompensation or volume overload, although ProBNP is elevated at 14051.0. Close attention will be pain to volume status, during hospitalization.  4. Dehydration: Patient appeared clinically dehydrated at presentation, and so, was gently hydrated overnight. This AM, hydration status appears satisfactory. Have discontinued iv fluids, in view of #3 above.  5. Hypertension: Holding her pre-admission antihypertensive at present in view of borderline SBP. 6. FTT: Patient has known failure to thrive, and this may be exacerbated by current acute medical issues. Will consult nutritionist.   7. Generalized weakness: Multi-factorial. For PT/OT.    Code Status: DNR.   Family Communication:  Disposition Plan: To be determined.    Brief narrative: 79 y.o. female resident of [facility], with history of Vascular dementia, failure to thrive, recurrent UTI, Mitral regurgitation, diastolic CHF, HTN, PVD, recurrent CVAs, dyslipidemia, Hypothyroidism, OA, osteoporosis, chronic back pain, recurrent falls, recurrent UTIs. On 12/04/12, she was found by her daughter in the afternoon, sitting in a chair with her mouth open, not moving her extremities. The daughter called the nursing staff, who evaluated the patient and patient was found to have generalized weakness without any focal neurological deficit. The daughter requested the patient to be transferred to ER. Patient denied any complaint of chest pain, nausea, shortness of breath, abdominal pain. Per the daughter, the patient has not been gaining weight, and has actually lost weight since last one week. There is no new swelling in the leg, the patient did not have any fall or trauma. The patient has history of urinary incontinence. Admitted for further evaluation and management.    Consultants:  N/A.   Procedures:  Head CT Scan.  CXR.   Antibiotics:  Rocephin/Azithromycin 12/04/12 only.  Vancomycin 12/04/12>>>  Zosyn 12/04/12>>>  HPI/Subjective: Feels better this AM.  Objective: Vital signs in last 24 hours: Temp:  [97.3 F (36.3 C)-98.8 F (37.1 C)] 97.3 F (36.3 C) (09/13 1020) Pulse Rate:  [35-82] 56 (09/13 1020) Resp:  [19-31] 20 (09/13 1020) BP: (90-164)/(32-77) 133/48 mmHg (09/13 1020) SpO2:  [91 %-100 %] 98 % (09/13 1020) Weight:  [45.224 kg (99 lb 11.2 oz)] 45.224 kg (99 lb 11.2 oz) (09/12 2338) Weight change:     Intake/Output from previous day:       Physical Exam: General: Comfortable, alert, communicative, not short of breath at rest.  HEENT:  No clinical  pallor, no jaundice, no conjunctival injection or discharge. Hydration status is satisfactory.  NECK:  Supple,  JVP not seen, no carotid bruits, no palpable lymphadenopathy, no palpable goiter. CHEST:  Clinically clear to auscultation, no wheezes, no crackles. HEART:  Sounds 1 and 2 heard, normal, regular, no murmurs. ABDOMEN:  Flat, soft, non-tender, no palpable organomegaly, no palpable masses, normal bowel sounds. GENITALIA:  Not examined. LOWER EXTREMITIES:  No pitting edema, palpable peripheral pulses. MUSCULOSKELETAL SYSTEM:  Generalized osteoarthritic changes, otherwise, normal. CENTRAL NERVOUS SYSTEM:  No focal neurologic deficit on gross examination.  Lab Results:  Recent Labs  12/04/12 2039 12/05/12 0519  WBC 11.5* 11.1*  HGB 14.3 12.9  HCT 42.9 40.5  PLT 201 175    Recent Labs  12/04/12 2039 12/05/12 0519  NA 138 139  K 4.8 4.1  CL 105 105  CO2 21 20  GLUCOSE 114* 130*  BUN 21 20  CREATININE 0.64 0.69  CALCIUM 8.9 8.6   No results found for this or any previous visit (from the past 240 hour(s)).   Studies/Results: Ct Head Wo Contrast  12/04/2012   CLINICAL DATA:  Stroke-like symptoms  EXAM: CT HEAD WITHOUT CONTRAST  TECHNIQUE: Contiguous axial images were obtained from the base of the skull through the vertex without intravenous contrast.  COMPARISON:  03/10/2012  FINDINGS: No intracranial hemorrhage, mass effect or midline shift. No skull fracture is noted. Paranasal sinuses and mastoid air cells are unremarkable.  Again noted stable atrophy. Extensive periventricular and patchy subcortical white matter decreased attenuation consistent with chronic small vessel ischemic changes again noted. No definite acute cortical infarction. No mass lesion is noted on this unenhanced scan.  IMPRESSION: No acute intracranial abnormality. Stable cerebral atrophy. No definite acute cortical infarction. Extensive periventricular and patchy subcortical white matter decreased attenuation probable due to chronic small vessel ischemic changes. If there is high clinical suspicion for acute  infarction follow-up MRI with diffusion imaging is recommended.   Electronically Signed   By: Natasha Mead   On: 12/04/2012 19:55   Dg Chest Port 1 View  12/04/2012   CLINICAL DATA:  Weakness, congestive heart failure  EXAM: PORTABLE CHEST - 1 VIEW  COMPARISON:  03/10/2012  FINDINGS: Cardiomegaly again noted. Small left pleural effusion with left basilar atelectasis or infiltrate. No convincing pulmonary edema. The patient is rotated to the right. Diffuse osteopenia is noted.  IMPRESSION: Cardiomegaly. No pulmonary edema. Small left pleural effusion with left basilar atelectasis or infiltrate.   Electronically Signed   By: Natasha Mead   On: 12/04/2012 19:09    Medications: Scheduled Meds: . aspirin  325 mg Oral Daily  . atorvastatin  10 mg Oral Daily  . azithromycin  500 mg Intravenous Q24H  . heparin  5,000 Units Subcutaneous Q8H  . levothyroxine  25 mcg Oral QAC breakfast  . metoprolol tartrate  12.5 mg Oral BID  . pantoprazole  40 mg Oral Daily  . piperacillin-tazobactam (ZOSYN)  IV  3.375 g Intravenous Q8H  . vancomycin  500 mg Intravenous Q24H   Continuous Infusions:  PRN Meds:.    LOS: 1 day   Braulio Kiedrowski,CHRISTOPHER  [facility] Pager [phone]. If 8PM-8AM, please contact night-coverage at [email or web address], password [facility] 12/05/2012, 11:05 AM  LOS: 1 day

## 2012-12-05 NOTE — Evaluation (Signed)
Physical Therapy Evaluation Patient Details Name: Julie Frank MRN: [id number] DOB: 06-03-1909 Today's Date: 12/05/2012 Time: 4098-1191 PT Time Calculation (min): 24 min  PT Assessment / Plan / Recommendation History of Present Illness  79 y.o. female with Past medical history of recurrent UTI, CHF, hypertension, recurrent CVA, failure to thrive, hypothyroidism.  Clinical Impression  Suspect pt to be functioning at baseline. Pt remains unable to care for self due to both cognitive and mobility deficits. Pt remains appropriate to return to SNF upon d/c. Acute PT to monitor pt to progress mobility.    PT Assessment  Patient needs continued PT services    Follow Up Recommendations  SNF    Does the patient have the potential to tolerate intense rehabilitation      Barriers to Discharge        Equipment Recommendations  None recommended by PT    Recommendations for Other Services     Frequency Min 2X/week    Precautions / Restrictions Precautions Precautions: Fall Restrictions Weight Bearing Restrictions: No   Pertinent Vitals/Pain Did not report      Mobility  Bed Mobility Bed Mobility: Supine to Sit;Sitting - Scoot to Edge of Bed Supine to Sit: 1: +2 Total assist Supine to Sit: Patient Percentage: 10% Sitting - Scoot to Edge of Bed: 1: +2 Total assist Sitting - Scoot to Edge of Bed: Patient Percentage: 10% Details for Bed Mobility Assistance: pt unable to comprehend task at hand. pt with retropulsion Transfers Transfers: Sit to Stand;Stand to Sit;Stand Pivot Transfers Sit to Stand: 1: +2 Total assist Sit to Stand: Patient Percentage: 10% Stand to Sit: 1: +2 Total assist Stand to Sit: Patient Percentage: 10% Stand Pivot Transfers: 1: +2 Total assist Stand Pivot Transfers: Patient Percentage: 10% Details for Transfer Assistance: pt unable to take steps, pt able to hold onto therapist with arms however unable to take steps Ambulation/Gait Ambulation/Gait  Assistance: Not tested (comment)    Exercises     PT Diagnosis: Difficulty walking;Generalized weakness  PT Problem List: Decreased strength;Decreased activity tolerance;Decreased balance PT Treatment Interventions: Gait training;Functional mobility training;Therapeutic activities;Therapeutic exercise     PT Goals(Current goals can be found in the care plan section) Acute Rehab PT Goals Patient Stated Goal: unable to to state PT Goal Formulation: Patient unable to participate in goal setting Time For Goal Achievement: 12/12/12 Potential to Achieve Goals: Fair  Visit Information  Last PT Received On: 12/05/12 Assistance Needed: +1 History of Present Illness: 79 y.o. female with Past medical history of recurrent UTI, CHF, hypertension, recurrent CVA, failure to thrive, hypothyroidism.       Prior Functioning  Home Living Family/patient expects to be discharged to:: Skilled nursing facility Prior Function Level of Independence: Needs assistance Gait / Transfers Assistance Needed: unclear PLOF ADL's / Homemaking Assistance Needed: assist for all adls Communication Communication: Expressive difficulties;HOH Dominant Hand: Left    Cognition  Cognition Arousal/Alertness: Awake/alert Behavior During Therapy: WFL for tasks assessed/performed Overall Cognitive Status: History of cognitive impairments - at baseline    Extremity/Trunk Assessment Upper Extremity Assessment Upper Extremity Assessment: Generalized weakness Lower Extremity Assessment Lower Extremity Assessment: Generalized weakness Cervical / Trunk Assessment Cervical / Trunk Assessment: Kyphotic   Balance Balance Balance Assessed: Yes Static Sitting Balance Static Sitting - Balance Support: Bilateral upper extremity supported Static Sitting - Level of Assistance: 2: Max assist Static Sitting - Comment/# of Minutes: 3 min, pt with retropulsion and unable to maintain sitting balance  End of Session PT - End of  Session Equipment Utilized During Treatment: Gait belt Activity Tolerance: Patient tolerated treatment well Patient left: in chair;with call bell/phone within reach Nurse Communication: Mobility status  GP     Marcene Brawn 12/05/2012, 2:18 PM  Lewis Shock, PT, DPT Pager #: [phone] Office #: [phone]

## 2012-12-05 NOTE — Evaluation (Signed)
Clinical/Bedside Swallow Evaluation Patient Details  Name: Julie Frank MRN: [id number] Date of Birth: 1909/06/07  Today's Date: 12/05/2012 Time: 1800-1830 SLP Time Calculation (min): 30 min  Past Medical History:  Past Medical History  Diagnosis Date  . Mitral insufficiency     CHRONIC  . Hypertension   . Debility     GENERALIZED  . Hyperlipidemia   . OA (osteoarthritis)   . OP (osteoporosis)   . CHF (congestive heart failure)   . History of diastolic dysfunction   . Muscle weakness (generalized)   . Difficulty walking   . Metabolic encephalopathy   . Hypothyroidism   . HOH (hard of hearing)     "extremely"  . SVT (supraventricular tachycardia)     nonsustained  . Heart murmur   . Repeated falls 2010    "3 serious falls; hit head each time; staples 1st 2 times"  . Vascular dementia dx'd 2011  . Peripheral vascular disease   . Dry cough 03/26/11    "chronic; dx'd years ago as allergy related type of thing"  . Bruises easily   . Chronic back pain greater than 3 months duration   . CVA (cerebrovascular accident) 03/2006; 04/2009    residual "speech problems & short term memory decreased"  . PNA (pneumonia)   . UTI (lower urinary tract infection)    Past Surgical History:  Past Surgical History  Procedure Laterality Date  . Removal colon polpys  12/2000    Villous adenoma of the cecum.  . Tonsillectomy and adenoidectomy    . Appendectomy    . Cataract extraction w/ intraocular lens  implant, bilateral  1984  . Dilation and curettage of uterus  1960's    "several"   HPI:  is a 79 y.o. female with Past medical history of recurrent UTI, CHF, hypertension, recurrent CVA, failure to thrive, hypothyroidism. Patient initially presented with generalized weakness. Head CT scan was negative for acute abnormality, and she had no focal neurological deficit. She was afebrile, but wcc was mildly elevated at 11.5. U/A revealed pyuria, suspicious for UTI, in the setting of known  history of recurrent UTIs and suppressive Bactrim therapy. Based on previous bacteriologic data, managing with iv Zosyn, now day #2. Awaiting urine cultures.  BSE ordered to assess risk for aspiration secondary to PMH of CVA and dysphagia.  MBS completed on 06/11/12 indicates oropharyngeal swallow WFL for normal aging population with recommendations for regular consistency vs. Mechanical soft/thin liquids.     Assessment / Plan / Recommendation Clinical Impression  BSE completed.  Oropharyngeal swallow functional for regular consistency and thin liquids.  No outward s/s of aspiraiton noted throughout evaluation.  Recommend to continue current diet consistency with full supervision with meals to assist ( cut up  meat in small bites) as necessary.  No ST f/u warranted at this time.  May benefit from ST consult in SNF setting for diet tolerance.  ST to sign off as education complete.    Aspiration Risk  Mild    Diet Recommendation Regular;Thin liquid   Liquid Administration via: Cup;Straw Medication Administration: Crushed with puree Supervision: Patient able to self feed;Full supervision/cueing for compensatory strategies Compensations: Slow rate;Small sips/bites Postural Changes and/or Swallow Maneuvers: Seated upright 90 degrees;Upright 30-60 min after meal    Other  Recommendations Oral Care Recommendations: Oral care QID   Follow Up Recommendations  Skilled Nursing facility      Swallow Study Prior Functional Status  SNF regular consistency and thin liquids  General Date of Onset: 12/04/12 HPI: is a 79 y.o. female with Past medical history of recurrent UTI, CHF, hypertension, recurrent CVA, failure to thrive, hypothyroidism. Type of Study: Bedside swallow evaluation Diet Prior to this Study: Regular;Thin liquids Respiratory Status: Supplemental O2 delivered via (comment) (nasal cannula ) History of Recent Intubation: No Behavior/Cognition: Alert;Cooperative;Pleasant mood;Hard of  hearing;Requires cueing Oral Cavity - Dentition: Adequate natural dentition Self-Feeding Abilities: Able to feed self;Needs assist Patient Positioning: Upright in bed Baseline Vocal Quality: Clear Volitional Cough: Weak Volitional Swallow: Able to elicit    Oral/Motor/Sensory Function Overall Oral Motor/Sensory Function: Appears within functional limits for tasks assessed   Ice Chips Ice chips: Not tested   Thin Liquid Thin Liquid: Impaired Presentation: Cup;Straw Pharyngeal  Phase Impairments: Suspected delayed Swallow    Nectar Thick Nectar Thick Liquid: Not tested   Honey Thick Honey Thick Liquid: Not tested   Puree Puree: Within functional limits   Solid   GO Moreen Fowler MS, CCC-SLP [phone] Solid: Within functional limits       [facility] 12/05/2012,7:14 PM

## 2012-12-05 NOTE — Progress Notes (Addendum)
INITIAL NUTRITION ASSESSMENT  DOCUMENTATION CODES Per approved criteria  -Not Applicable   INTERVENTION: Ensure Complete po BID, each supplement provides 350 kcal and 13 grams of protein.  NUTRITION DIAGNOSIS: Inadequate oral intake related to decreased appetite as evidenced by meal completion 50%.  Goal: Pt to meet >/= 90% of their estimated nutrition needs   Monitor:  Diet advancement, PO intake, weight trend, labs  Reason for Assessment: Pt identified as at nutrition risk on the Malnutrition Screen Tool  79 y.o. female  Admitting Dx: UTI (lower urinary tract infection)  ASSESSMENT: Pt admitted from [facility] with UTI.  Per H&P pt has had weight loss in the last week. Per record review weight is within pt's normal range.  Pt is sleeping soundly with no family present.   Height: Ht Readings from Last 1 Encounters:  12/04/12 5' (1.524 m)    Weight: Wt Readings from Last 1 Encounters:  12/04/12 99 lb 11.2 oz (45.224 kg)    Ideal Body Weight: 100 lb   % Ideal Body Weight: 100%  Wt Readings from Last 10 Encounters:  12/04/12 99 lb 11.2 oz (45.224 kg)  08/31/12 106 lb (48.081 kg)  07/30/12 101 lb (45.813 kg)  06/30/12 96 lb 3.2 oz (43.636 kg)  03/10/12 104 lb 15 oz (47.6 kg)  02/26/12 90 lb 12.8 oz (41.187 kg)  03/27/11 109 lb 1.6 oz (49.487 kg)  03/14/11 115 lb (52.164 kg)  09/06/10 115 lb (52.164 kg)    Usual Body Weight: 96-106 lb   % Usual Body Weight: within normal range  BMI:  Body mass index is 19.47 kg/(m^2).  Estimated Nutritional Needs: Kcal: 1100-1300 Protein: 55-65 grams Fluid: >1.5 L/day  Skin:  Left toe Scratch marks on coccyx and buttocks Right heel Left heel Left knee  Diet Order: Cardiac  EDUCATION NEEDS: -No education needs identified at this time  No intake or output data in the 24 hours ending 12/05/12 1501  Last BM: PTA   Labs:   Recent Labs Lab 12/04/12 2039 12/05/12 0519  NA 138 139  K 4.8 4.1  CL 105 105   CO2 21 20  BUN 21 20  CREATININE 0.64 0.69  CALCIUM 8.9 8.6  GLUCOSE 114* 130*    CBG (last 3)   Recent Labs  12/05/12 0739 12/05/12 1215  GLUCAP 99 87    Scheduled Meds: . aspirin  325 mg Oral Daily  . atorvastatin  10 mg Oral Daily  . azithromycin  500 mg Intravenous Q24H  . heparin  5,000 Units Subcutaneous Q8H  . levothyroxine  25 mcg Oral QAC breakfast  . pantoprazole  40 mg Oral Daily  . piperacillin-tazobactam (ZOSYN)  IV  3.375 g Intravenous Q8H  . vancomycin  500 mg Intravenous Q24H    Continuous Infusions:   Past Medical History  Diagnosis Date  . Mitral insufficiency     CHRONIC  . Hypertension   . Debility     GENERALIZED  . Hyperlipidemia   . OA (osteoarthritis)   . OP (osteoporosis)   . CHF (congestive heart failure)   . History of diastolic dysfunction   . Muscle weakness (generalized)   . Difficulty walking   . Metabolic encephalopathy   . Hypothyroidism   . HOH (hard of hearing)     "extremely"  . SVT (supraventricular tachycardia)     nonsustained  . Heart murmur   . Repeated falls 2010    "3 serious falls; hit head each time; staples 1st 2 times"  .  Vascular dementia dx'd 2011  . Peripheral vascular disease   . Dry cough 03/26/11    "chronic; dx'd years ago as allergy related type of thing"  . Bruises easily   . Chronic back pain greater than 3 months duration   . CVA (cerebrovascular accident) 03/2006; 04/2009    residual "speech problems & short term memory decreased"  . PNA (pneumonia)   . UTI (lower urinary tract infection)     Past Surgical History  Procedure Laterality Date  . Removal colon polpys  12/2000    Villous adenoma of the cecum.  . Tonsillectomy and adenoidectomy    . Appendectomy    . Cataract extraction w/ intraocular lens  implant, bilateral  1984  . Dilation and curettage of uterus  1960's    "several"    Kendell Bane RD, LDN, CNSC [phone] Pager [phone] After Hours Pager

## 2012-12-06 LAB — GLUCOSE, CAPILLARY: Glucose-Capillary: 93 mg/dL (ref 70–99)

## 2012-12-06 LAB — BASIC METABOLIC PANEL
BUN: 18 mg/dL (ref 6–23)
Calcium: 8.6 mg/dL (ref 8.4–10.5)
GFR calc non Af Amer: 69 mL/min — ABNORMAL LOW (ref >90)
Glucose, Bld: 76 mg/dL (ref 70–99)

## 2012-12-06 LAB — CBC
HCT: 43.6 % (ref 36.0–46.0)
Hemoglobin: 14.3 g/dL (ref 12.0–15.0)
MCH: 29.1 pg (ref 26.0–34.0)
MCHC: 32.8 g/dL (ref 30.0–36.0)

## 2012-12-06 MED ORDER — AMLODIPINE BESYLATE 5 MG PO TABS
5.0000 mg | ORAL_TABLET | Freq: Every day | ORAL | Status: DC
  Administered 2012-12-06 – 2012-12-07 (×2): 5 mg via ORAL
  Filled 2012-12-06 (×2): qty 1

## 2012-12-06 NOTE — Progress Notes (Signed)
OT Cancellation Note  Patient Details Name: Julie Frank MRN: [id number] DOB: December 20, 1909   Cancelled Treatment:    Reason Eval/Treat Not Completed: Other (comment). Pt from SNF and D/C plan is back to SNF. Defer OT eval to that facility as they deem appropriate. Acute OT will sign off.  Evette Georges [phone] 12/06/2012, 12:09 PM

## 2012-12-06 NOTE — Progress Notes (Signed)
[facility] PROGRESS NOTE  REBEKKA LOBELLO ZOX:[id number] DOB: 1909/09/07 DOA: 12/04/2012 PCP: Kimber Relic, MD  Assessment/Plan: Principal Problem:   UTI (lower urinary tract infection) Active Problems:   CVA (cerebrovascular accident)   Hypertension   Chronic diastolic CHF (congestive heart failure)   Aspiration pneumonia    1. UTI (lower urinary tract infection): Patient initially presented with generalized weakness. Head CT scan was negative for acute abnormality, and she had no focal neurological deficit. She was afebrile, but wcc was mildly elevated at 11.5. U/A revealed pyuria, suspicious for UTI, in the setting of known history of recurrent UTIs. Based on previous bacteriologic data, patient was commenced on iv Zosyn, now day #3. Urine cultures revealed significant growth of GNR. Awaiting identification. Patient feels a lot better today, has remained afebrile, and wcc has normalized at 9.9.  2. Query HCAP vs Aspiration Pneumonia: CXR revealed a small left pleural effusion with left basilar atelectasis or infiltrate, suggestive of HCAP vs aspiration, although patient has no cough, or shortness of breath. Blood cultures are negative so far, patient has no respiratory tract symptoms, and SLP examination revealed no evidence of overt aspiration. Suspect silent aspiration. Have discontinued Vancomycin today. Adequately covered with Zosyn.  3. Chronic diastolic CHF: Patient has known history of chronic diastolic CHF, EF 81% to 55%, per 2D Echocardiogram 02/2012. Clinically, she has no evidence of CHF decompensation or volume overload, although ProBNP at presentation was elevated [place] [address].0. Close attention will be paid to volume status, during hospitalization.  4. Dehydration: Patient appeared clinically dehydrated at presentation, and so, was gently hydrated overnight. As of 12/05/12 AM, hydration status appears satisfactory. IV fluids were discontinued accordingly on the date, in view  of #3 above.  5. Hypertension: Pre-admission antihypertensive (Toprol XL) was placed on hold, in view of borderline SBP at presentation. BP is now improved at 148/60. As she has a bradycardia and 1st degree heart block, will utilize Norvasc 5 mg daily, in lieu. 6. FTT: Patient has known failure to thrive, and this may be exacerbated by current acute medical issues. Consulted nutritionist. Managing as recommended.  7. Generalized weakness: Multi-factorial. PT/OT. Improved.   8. Decubitus: Patient was noted to have bilateral heel decubiti and sacral decubitus, present on admission. For WOC, Mattress overlay and heel lifts.    Code Status: DNR.  Family Communication:  Disposition Plan: To be determined.    Brief narrative: 79 y.o. female resident of [facility], with history of Vascular dementia, failure to thrive, recurrent UTI, Mitral regurgitation, diastolic CHF, HTN, PVD, recurrent CVAs, dyslipidemia, Hypothyroidism, OA, osteoporosis, chronic back pain, recurrent falls, recurrent UTIs. On 12/04/12, she was found by her daughter in the afternoon, sitting in a chair with her mouth open, not moving her extremities. The daughter called the nursing staff, who evaluated the patient and patient was found to have generalized weakness without any focal neurological deficit. The daughter requested the patient to be transferred to ER. Patient denied any complaint of chest pain, nausea, shortness of breath, abdominal pain. Per the daughter, the patient has not been gaining weight, and has actually lost weight since last one week. There is no new swelling in the leg, the patient did not have any fall or trauma. The patient has history of urinary incontinence. Admitted for further evaluation and management.    Consultants:  N/A.   Procedures:  Head CT Scan.  CXR.   Antibiotics:  Rocephin/Azithromycin 12/04/12 only.  Vancomycin 12/04/12-12/06/12.   Zosyn 12/04/12>>>  HPI/Subjective:  No  new issues.   Objective: Vital signs in last 24 hours: Temp:  [98.1 F (36.7 C)] 98.1 F (36.7 C) (09/14 0600) Pulse Rate:  [68] 68 (09/14 0600) Resp:  [18] 18 (09/14 0600) BP: (148)/(60) 148/60 mmHg (09/14 0600) SpO2:  [96 %] 96 % (09/14 0600) Weight change:  Last BM Date: 12/05/12  Intake/Output from previous day: 09/13 0701 - 09/14 0700 In: 50 [IV Piggyback:50] Out: -  Total I/O In: 50 [IV Piggyback:50] Out: -    Physical Exam: General: Comfortable, alert, communicative, not short of breath at rest.  HEENT:  No clinical pallor, no jaundice, no conjunctival injection or discharge. Hydration status is satisfactory.  NECK:  Supple, JVP not seen, no carotid bruits, no palpable lymphadenopathy, no palpable goiter. CHEST:  Clinically clear to auscultation, no wheezes, no crackles. HEART:  Sounds 1 and 2 heard, normal, regular, no murmurs. ABDOMEN:  Flat, soft, non-tender, no palpable organomegaly, no palpable masses, normal bowel sounds. GENITALIA:  Not examined. LOWER EXTREMITIES:  No pitting edema, palpable peripheral pulses. Has bilateral heel decubiti.  MUSCULOSKELETAL SYSTEM:  Generalized osteoarthritic changes, otherwise, normal. CENTRAL NERVOUS SYSTEM:  No focal neurologic deficit on gross examination.  Lab Results:  Recent Labs  12/05/12 0519 12/06/12 0950  WBC 11.1* 9.9  HGB 12.9 14.3  HCT 40.5 43.6  PLT 175 156    Recent Labs  12/05/12 0519 12/06/12 0950  NA 139 138  K 4.1 4.2  CL 105 103  CO2 20 22  GLUCOSE 130* 76  BUN 20 18  CREATININE 0.69 0.68  CALCIUM 8.6 8.6   Recent Results (from the past 240 hour(s))  URINE CULTURE     Status: None   Collection Time    12/04/12  8:07 PM      Result Value Range Status   Specimen Description URINE, CATHETERIZED   Final   Special Requests NONE   Final   Culture  Setup Time     Final   Value: 12/04/2012 21:38     Performed at [facility] Count     Final   Value: >=100,000 COLONIES/ML      Performed at [facility]   Culture     Final   Value: GRAM NEGATIVE RODS     Performed at [facility]   Report Status PENDING   Incomplete  CULTURE, BLOOD (ROUTINE X 2)     Status: None   Collection Time    12/04/12 10:58 PM      Result Value Range Status   Specimen Description BLOOD FOREARM RIGHT   Final   Special Requests BOTTLES DRAWN AEROBIC ONLY 2CC   Final   Culture  Setup Time     Final   Value: 12/05/2012 05:26     Performed at [facility]   Culture     Final   Value:        BLOOD CULTURE RECEIVED NO GROWTH TO DATE CULTURE WILL BE HELD FOR 5 DAYS BEFORE ISSUING A FINAL NEGATIVE REPORT     Performed at [facility]   Report Status PENDING   Incomplete  CULTURE, BLOOD (ROUTINE X 2)     Status: None   Collection Time    12/05/12  5:19 PM      Result Value Range Status   Specimen Description BLOOD BLOOD RIGHT FOREARM   Final   Special Requests BOTTLES DRAWN AEROBIC ONLY 4CC   Final   Culture  Setup Time     Final   Value: 12/05/2012 21:30     Performed at [facility]   Culture     Final   Value:        BLOOD CULTURE RECEIVED NO GROWTH TO DATE CULTURE WILL BE HELD FOR 5 DAYS BEFORE ISSUING A FINAL NEGATIVE REPORT     Performed at [facility]   Report Status PENDING   Incomplete     Studies/Results: Ct Head Wo Contrast  12/04/2012   CLINICAL DATA:  Stroke-like symptoms  EXAM: CT HEAD WITHOUT CONTRAST  TECHNIQUE: Contiguous axial images were obtained from the base of the skull through the vertex without intravenous contrast.  COMPARISON:  03/10/2012  FINDINGS: No intracranial hemorrhage, mass effect or midline shift. No skull fracture is noted. Paranasal sinuses and mastoid air cells are unremarkable.  Again noted stable atrophy. Extensive periventricular and patchy subcortical white matter decreased attenuation consistent with chronic small vessel ischemic changes again noted. No definite acute cortical infarction. No mass  lesion is noted on this unenhanced scan.  IMPRESSION: No acute intracranial abnormality. Stable cerebral atrophy. No definite acute cortical infarction. Extensive periventricular and patchy subcortical white matter decreased attenuation probable due to chronic small vessel ischemic changes. If there is high clinical suspicion for acute infarction follow-up MRI with diffusion imaging is recommended.   Electronically Signed   By: Natasha Mead   On: 12/04/2012 19:55   Dg Chest Port 1 View  12/04/2012   CLINICAL DATA:  Weakness, congestive heart failure  EXAM: PORTABLE CHEST - 1 VIEW  COMPARISON:  03/10/2012  FINDINGS: Cardiomegaly again noted. Small left pleural effusion with left basilar atelectasis or infiltrate. No convincing pulmonary edema. The patient is rotated to the right. Diffuse osteopenia is noted.  IMPRESSION: Cardiomegaly. No pulmonary edema. Small left pleural effusion with left basilar atelectasis or infiltrate.   Electronically Signed   By: Natasha Mead   On: 12/04/2012 19:09    Medications: Scheduled Meds: . aspirin  325 mg Oral Daily  . atorvastatin  10 mg Oral Daily  . azithromycin  500 mg Intravenous Q24H  . feeding supplement  237 mL Oral BID BM  . heparin  5,000 Units Subcutaneous Q8H  . levothyroxine  25 mcg Oral QAC breakfast  . pantoprazole  40 mg Oral Daily  . piperacillin-tazobactam (ZOSYN)  IV  3.375 g Intravenous Q8H  . vancomycin  500 mg Intravenous Q24H   Continuous Infusions:  PRN Meds:.    LOS: 2 days   Szymon Foiles,CHRISTOPHER  [facility] Pager [phone]. If 8PM-8AM, please contact night-coverage at [email or web address], password [facility] 12/06/2012, 3:56 PM  LOS: 2 days

## 2012-12-07 DIAGNOSIS — I6350 Cerebral infarction due to unspecified occlusion or stenosis of unspecified cerebral artery: Secondary | ICD-10-CM

## 2012-12-07 LAB — GLUCOSE, CAPILLARY: Glucose-Capillary: 144 mg/dL — ABNORMAL HIGH (ref 70–99)

## 2012-12-07 LAB — CBC
HCT: 42 % (ref 36.0–46.0)
Hemoglobin: 13.5 g/dL (ref 12.0–15.0)
MCH: 28.5 pg (ref 26.0–34.0)
MCHC: 32.1 g/dL (ref 30.0–36.0)
MCV: 88.6 fL (ref 78.0–100.0)
RDW: 16.6 % — ABNORMAL HIGH (ref 11.5–15.5)

## 2012-12-07 LAB — BASIC METABOLIC PANEL
BUN: 14 mg/dL (ref 6–23)
CO2: 21 mEq/L (ref 19–32)
Calcium: 8.3 mg/dL — ABNORMAL LOW (ref 8.4–10.5)
Chloride: 107 mEq/L (ref 96–112)
Creatinine, Ser: 0.58 mg/dL (ref 0.50–1.10)
Glucose, Bld: 94 mg/dL (ref 70–99)

## 2012-12-07 LAB — URINE CULTURE

## 2012-12-07 MED ORDER — AMLODIPINE BESYLATE 5 MG PO TABS: 5.0000 mg | ORAL_TABLET | Freq: Every day | ORAL | Status: DC

## 2012-12-07 MED ORDER — LEVOFLOXACIN 750 MG PO TABS: 750.0000 mg | ORAL_TABLET | Freq: Every day | ORAL | Status: DC

## 2012-12-07 NOTE — Progress Notes (Signed)
Patient discharged to [facility], report given to nurse V'estres

## 2012-12-07 NOTE — Progress Notes (Signed)
ANTIBIOTIC CONSULT NOTE - Initial  Pharmacy Consult for Levaquin Indication: Enterobacter UTI and suspected asp pna  No Known Allergies  Patient Measurements: Height: 5 ft Weight: 45kg  Vital Signs: Temp: 97.9 F (36.6 C) (09/15 0457) Temp src: Oral (09/15 0457) BP: 137/77 mmHg (09/15 0457) Pulse Rate: 99 (09/15 0457)  Labs:  Recent Labs  12/04/12 2039 12/05/12 0519 12/06/12 0950 12/07/12 0600  WBC 11.5* 11.1* 9.9  --   HGB 14.3 12.9 14.3  --   PLT 201 175 156  --   CREATININE 0.64 0.69 0.68 0.58     Microbiology: Recent Results (from the past 720 hour(s))  URINE CULTURE     Status: None   Collection Time    12/04/12  8:07 PM      Result Value Range Status   Specimen Description URINE, CATHETERIZED   Final   Special Requests NONE   Final   Culture  Setup Time     Final   Value: 12/04/2012 21:38     Performed at [facility] Count     Final   Value: >=100,000 COLONIES/ML     Performed at [facility]   Culture     Final   Value: ENTEROBACTER CLOACAE     Performed at [facility]   Report Status 12/07/2012 FINAL   Final   Organism ID, Bacteria ENTEROBACTER CLOACAE   Final  CULTURE, BLOOD (ROUTINE X 2)     Status: None   Collection Time    12/04/12 10:58 PM      Result Value Range Status   Specimen Description BLOOD FOREARM RIGHT   Final   Special Requests BOTTLES DRAWN AEROBIC ONLY 2CC   Final   Culture  Setup Time     Final   Value: 12/05/2012 05:26     Performed at [facility]   Culture     Final   Value:        BLOOD CULTURE RECEIVED NO GROWTH TO DATE CULTURE WILL BE HELD FOR 5 DAYS BEFORE ISSUING A FINAL NEGATIVE REPORT     Performed at [facility]   Report Status PENDING   Incomplete  CULTURE, BLOOD (ROUTINE X 2)     Status: None   Collection Time    12/05/12  5:19 PM      Result Value Range Status   Specimen Description BLOOD BLOOD RIGHT FOREARM   Final   Special Requests BOTTLES DRAWN AEROBIC  ONLY 4CC   Final   Culture  Setup Time     Final   Value: 12/05/2012 21:30     Performed at [facility]   Culture     Final   Value:        BLOOD CULTURE RECEIVED NO GROWTH TO DATE CULTURE WILL BE HELD FOR 5 DAYS BEFORE ISSUING A FINAL NEGATIVE REPORT     Performed at [facility]   Report Status PENDING   Incomplete   Assessment: 79yo female to start Levaquin Day #1 for Enterobacter UTI and possible asp pna. Of note, this is total abx Day #4 but Enterobacter with intermediate resistance to Zosyn. (on bactrim for UTI prophylaxis PTA per MD note). Afebrile. WBC down to nl. SCr remains stable.  9/12 Zosyn>>9/15 9/12 Vanco>>9/14 9/12 Azithro>>9/14 9/15 Levaquin>>  9/12 urine>>Enterobacter cloacae (S Cipro/Gent/Levo/Tobra/Bactrim; I Nitro/Zosyn; R Ancef/Roceph) 9/12 blood>>ngtd  Goal of Therapy:  Eradication of infection  Plan:  1) Levaquin 750mg  IV q48h  2) Will f/u renal function, cx, pt's clinical condition 3) If pt continues to improve, suspect can d/c Levaquin after 5 days (higher dosing regimen x 5 days for complicated UTI)  Christoper Fabian, PharmD, BCPS Clinical pharmacist, pager [phone]  12/07/2012,9:09 AM

## 2012-12-07 NOTE — Consult Note (Signed)
WOC consult Note Reason for Consult: Consult requested for pressure ulcers.  Sacrum and buttocks assessed; scattered scratch marks but no pressure ulcer noted.  Pt frequently incontinent of stool and immobile.  Barrier cream to protect skin.  Left and right heels stage 1 wounds; 4X4cm red and nonblanchable.  Appropriately floated to reduce pressure.   Left and right heel and left great toe all have small areas of dry callous, each site approx .1X.1cm. No open wounds or drainage. No other topical care indicated at this time. Please re-consult if further assistance is needed.  Thank-you,  Cammie Mcgee MSN, RN, CWOCN, [place], CNS [phone]

## 2012-12-07 NOTE — Discharge Summary (Signed)
Physician Discharge Summary  Julie Frank ZOX:[id number] DOB: May 22, 1909 DOA: 12/04/2012  PCP: Kimber Relic, MD  Admit date: 12/04/2012 Discharge date: 12/07/2012  Time spent: 40 minutes  Recommendations for Outpatient Follow-up:  1. Follow up with primary MD.   Discharge Diagnoses:  Principal Problem:   UTI (lower urinary tract infection) Active Problems:   CVA (cerebrovascular accident)   Hypertension   Chronic diastolic CHF (congestive heart failure)   Aspiration pneumonia   Discharge Condition: Satisfactory.   Diet recommendation: Heart-Healthy.   Filed Weights   12/04/12 2338  Weight: 45.224 kg (99 lb 11.2 oz)    History of present illness:  79 y.o. female resident of [facility], with history of Vascular dementia, failure to thrive, recurrent UTI, Mitral regurgitation, diastolic CHF, HTN, PVD, recurrent CVAs, dyslipidemia, Hypothyroidism, OA, osteoporosis, chronic back pain, recurrent falls, recurrent UTIs. On 12/04/12, she was found by her daughter in the afternoon, sitting in a chair with her mouth open, not moving her extremities. The daughter called the nursing staff, who evaluated the patient and patient was found to have generalized weakness without any focal neurological deficit. The daughter requested the patient to be transferred to ER. Patient denied any complaint of chest pain, nausea, shortness of breath, abdominal pain. Per the daughter, the patient has not been gaining weight, and has actually lost weight since last one week. There is no new swelling in the leg, the patient did not have any fall or trauma. The patient has history of urinary incontinence. Admitted for further evaluation and management.    Hospital Course:  1. UTI (lower urinary tract infection): Patient initially presented with generalized weakness. Head CT scan was negative for acute abnormality, and she had no focal neurological deficit. She was afebrile, but wcc was mildly  elevated at 11.5. U/A revealed pyuria, suspicious for UTI, in the setting of known history of recurrent UTIs. Based on previous bacteriologic data, patient was initially commenced on iv Zosyn, however, as urine culture grew Enterobacter Cloacae, sensitive to quinolones, she was on 89/15/14, transitioned to Levaquin, which will be concluded on 12/10/12. Clinically, patient improved dramatically, and as of 12/06/12, wcc had normalized at 9.9.  2. Query HCAP vs Aspiration Pneumonia: CXR revealed a small left pleural effusion with left basilar atelectasis or infiltrate, suggestive of HCAP vs aspiration, although patient had no cough, or shortness of breath. Blood cultures remained negative, patient has no respiratory tract symptoms, and SLP examination revealed no evidence of overt aspiration. Suspect silent aspiration. Vancomycin was discontinued on 12/06/12.  3. Chronic diastolic CHF: Patient has known history of chronic diastolic CHF, EF 81% to 55%, per 2D Echocardiogram 02/2012. Clinically, she had no evidence of CHF decompensation or volume overload, although ProBNP at presentation was elevated at [address].0. Close attention was paid to volume status, during hospitalization, and patient remained euvolemi.  4. Dehydration: Patient appeared clinically dehydrated at presentation, and so, was gently hydrated overnight. As of 12/05/12 AM, hydration status appears satisfactory. IV fluids were discontinued accordingly on the date, in view of #3 above.  5. Hypertension: Pre-admission antihypertensive (Toprol XL) was placed on hold, in view of borderline SBP at presentation. As of 12/06/12, BP had improved at 148/60. As she had a bradycardia in the 50s and 40s, as well as 1st degree heart block, she was placed on Norvasc 5 mg daily, in lieu, with resolution of bradycardia and normalization of BP.  6. FTT: Patient has known failure to thrive, and this may be exacerbated by  current acute medical issues. Consulted nutritionist.  Managed as recommended.  7. Generalized weakness: Multi-factorial. PT/OT. Improved.  8. Decubitus: Patient was noted to have bilateral heel decubiti, present on admission. Mattress overlay and heel lifts, were utilized, WOC was consulted, and recommendations, implemented.    Procedures:  See Below.   Consultations:  N/A.   Discharge Exam: Filed Vitals:   12/07/12 0457  BP: 137/77  Pulse: 99  Temp: 97.9 F (36.6 C)  Resp: 18    General: Comfortable, alert, communicative, not short of breath at rest.  HEENT: No clinical pallor, no jaundice, no conjunctival injection or discharge. Hydration status is satisfactory.  NECK: Supple, JVP not seen, no carotid bruits, no palpable lymphadenopathy, no palpable goiter.  CHEST: Clinically clear to auscultation, no wheezes, no crackles.  HEART: Sounds 1 and 2 heard, normal, regular, no murmurs.  ABDOMEN: Flat, soft, non-tender, no palpable organomegaly, no palpable masses, normal bowel sounds.  GENITALIA: Not examined.  LOWER EXTREMITIES: No pitting edema, palpable peripheral pulses. Has bilateral heel decubiti.  MUSCULOSKELETAL SYSTEM: Generalized osteoarthritic changes, otherwise, normal.  CENTRAL NERVOUS SYSTEM: No focal neurologic deficit on gross examination.  Discharge Instructions      Discharge Orders   Future Appointments Provider Department Dept Phone   12/23/2012 1:45 PM Peter M Swaziland, MD [facility] Main [facility] [place]) [phone]   Future Orders Complete By Expires   Diet - low sodium heart healthy  As directed    Increase activity slowly  As directed        Medication List    STOP taking these medications       metoprolol succinate 25 MG 24 hr tablet  Commonly known as:  TOPROL-XL     sulfamethoxazole-trimethoprim 800-160 MG per tablet  Commonly known as:  BACTRIM DS      TAKE these medications       amLODipine 5 MG tablet  Commonly known as:  NORVASC  Take 1 tablet (5 mg total) by mouth daily.      aspirin 325 MG tablet  Take 1 tablet (325 mg total) by mouth daily.     atorvastatin 10 MG tablet  Commonly known as:  LIPITOR  Take 10 mg by mouth daily.     CALCIUM 600 + D PO  Take 1 tablet by mouth 2 (two) times daily.     Cranberry 425 MG Caps  Take 1 capsule by mouth 2 (two) times daily.     feeding supplement Liqd  Take 237 mLs by mouth 3 (three) times daily with meals.     levofloxacin 750 MG tablet  Commonly known as:  LEVAQUIN  Take 1 tablet (750 mg total) by mouth daily.     levothyroxine 25 MCG tablet  Commonly known as:  SYNTHROID, LEVOTHROID  Take 25 mcg by mouth daily.     mirtazapine 15 MG tablet  Commonly known as:  REMERON  Take 15 mg by mouth at bedtime.     omeprazole 20 MG capsule  Commonly known as:  PRILOSEC  Take 20 mg by mouth daily.     polyethylene glycol packet  Commonly known as:  MIRALAX / GLYCOLAX  Take 17 g by mouth every other day. As needed for constipation.       No Known Allergies Follow-up Information   Follow up with GREEN, Lenon Curt, MD.   Specialty:  Internal Medicine   Contact information:   [address] [place] [address] [phone]  The results of significant diagnostics from this hospitalization (including imaging, microbiology, ancillary and laboratory) are listed below for reference.    Significant Diagnostic Studies: Ct Head Wo Contrast  12/04/2012   CLINICAL DATA:  Stroke-like symptoms  EXAM: CT HEAD WITHOUT CONTRAST  TECHNIQUE: Contiguous axial images were obtained from the base of the skull through the vertex without intravenous contrast.  COMPARISON:  03/10/2012  FINDINGS: No intracranial hemorrhage, mass effect or midline shift. No skull fracture is noted. Paranasal sinuses and mastoid air cells are unremarkable.  Again noted stable atrophy. Extensive periventricular and patchy subcortical white matter decreased attenuation consistent with chronic small vessel ischemic changes again noted. No  definite acute cortical infarction. No mass lesion is noted on this unenhanced scan.  IMPRESSION: No acute intracranial abnormality. Stable cerebral atrophy. No definite acute cortical infarction. Extensive periventricular and patchy subcortical white matter decreased attenuation probable due to chronic small vessel ischemic changes. If there is high clinical suspicion for acute infarction follow-up MRI with diffusion imaging is recommended.   Electronically Signed   By: Natasha Mead   On: 12/04/2012 19:55   Dg Chest Port 1 View  12/04/2012   CLINICAL DATA:  Weakness, congestive heart failure  EXAM: PORTABLE CHEST - 1 VIEW  COMPARISON:  03/10/2012  FINDINGS: Cardiomegaly again noted. Small left pleural effusion with left basilar atelectasis or infiltrate. No convincing pulmonary edema. The patient is rotated to the right. Diffuse osteopenia is noted.  IMPRESSION: Cardiomegaly. No pulmonary edema. Small left pleural effusion with left basilar atelectasis or infiltrate.   Electronically Signed   By: Natasha Mead   On: 12/04/2012 19:09    Microbiology: Recent Results (from the past 240 hour(s))  URINE CULTURE     Status: None   Collection Time    12/04/12  8:07 PM      Result Value Range Status   Specimen Description URINE, CATHETERIZED   Final   Special Requests NONE   Final   Culture  Setup Time     Final   Value: 12/04/2012 21:38     Performed at [facility] Count     Final   Value: >=100,000 COLONIES/ML     Performed at [facility]   Culture     Final   Value: ENTEROBACTER CLOACAE     Performed at [facility]   Report Status 12/07/2012 FINAL   Final   Organism ID, Bacteria ENTEROBACTER CLOACAE   Final  CULTURE, BLOOD (ROUTINE X 2)     Status: None   Collection Time    12/04/12 10:58 PM      Result Value Range Status   Specimen Description BLOOD FOREARM RIGHT   Final   Special Requests BOTTLES DRAWN AEROBIC ONLY 2CC   Final   Culture  Setup Time      Final   Value: 12/05/2012 05:26     Performed at [facility]   Culture     Final   Value:        BLOOD CULTURE RECEIVED NO GROWTH TO DATE CULTURE WILL BE HELD FOR 5 DAYS BEFORE ISSUING A FINAL NEGATIVE REPORT     Performed at [facility]   Report Status PENDING   Incomplete  CULTURE, BLOOD (ROUTINE X 2)     Status: None   Collection Time    12/05/12  5:19 PM      Result Value Range Status   Specimen Description BLOOD BLOOD RIGHT FOREARM  Final   Special Requests BOTTLES DRAWN AEROBIC ONLY 4CC   Final   Culture  Setup Time     Final   Value: 12/05/2012 21:30     Performed at [facility]   Culture     Final   Value:        BLOOD CULTURE RECEIVED NO GROWTH TO DATE CULTURE WILL BE HELD FOR 5 DAYS BEFORE ISSUING A FINAL NEGATIVE REPORT     Performed at [facility]   Report Status PENDING   Incomplete     Labs: Basic Metabolic Panel:  Recent Labs Lab 12/04/12 2039 12/05/12 0519 12/06/12 0950 12/07/12 0600  NA 138 139 138 139  K 4.8 4.1 4.2 3.7  CL 105 105 103 107  CO2 21 20 22 21   GLUCOSE 114* 130* 76 94  BUN 21 20 18 14   CREATININE 0.64 0.69 0.68 0.58  CALCIUM 8.9 8.6 8.6 8.3*   Liver Function Tests:  Recent Labs Lab 12/05/12 0519  AST 111*  ALT 64*  ALKPHOS 143*  BILITOT 0.7  PROT 6.1  ALBUMIN 2.7*   No results found for this basename: LIPASE, AMYLASE,  in the last 168 hours No results found for this basename: AMMONIA,  in the last 168 hours CBC:  Recent Labs Lab 12/04/12 2039 12/05/12 0519 12/06/12 0950 12/07/12 0935  WBC 11.5* 11.1* 9.9 9.8  NEUTROABS  --  9.7*  --   --   HGB 14.3 12.9 14.3 13.5  HCT 42.9 40.5 43.6 42.0  MCV 87.7 88.6 88.8 88.6  PLT 201 175 156 182   Cardiac Enzymes:  Recent Labs Lab 12/04/12 2039  TROPONINI <0.30   BNP: BNP (last 3 results)  Recent Labs  12/04/12 2039 12/05/12 0519  PROBNP 14051.0* 14473.0*   CBG:  Recent Labs Lab 12/06/12 0816 12/06/12 1156 12/06/12 1717  12/06/12 2211 12/07/12 0754  GLUCAP 91 93 80 121* 91       Signed:  Mazi Brailsford,CHRISTOPHER  [facility] 12/07/2012, 11:45 AM

## 2012-12-08 ENCOUNTER — Telehealth: Payer: Self-pay | Admitting: Cardiology

## 2012-12-08 NOTE — Telephone Encounter (Signed)
Returned call to [name] she stated she was unable to talk she was late for a meeting.Stated she just wanted to give me a update on her mother's condition.Julie Frank was told I am off tomorrow 12/09/12, will call her back on Thursday 12/10/12.

## 2012-12-08 NOTE — Telephone Encounter (Signed)
F/u   Pt's daughter need you to call her.

## 2012-12-08 NOTE — Telephone Encounter (Signed)
New Problem  Pt's daughter wants to discuss pt previous hospital visit// Bloodwork results and etc..

## 2012-12-09 ENCOUNTER — Encounter: Payer: Self-pay | Admitting: Internal Medicine

## 2012-12-09 ENCOUNTER — Non-Acute Institutional Stay (SKILLED_NURSING_FACILITY): Payer: Medicare Other | Admitting: Internal Medicine

## 2012-12-09 DIAGNOSIS — N390 Urinary tract infection, site not specified: Secondary | ICD-10-CM

## 2012-12-09 DIAGNOSIS — E039 Hypothyroidism, unspecified: Secondary | ICD-10-CM

## 2012-12-09 DIAGNOSIS — F0150 Vascular dementia without behavioral disturbance: Secondary | ICD-10-CM | POA: Insufficient documentation

## 2012-12-09 DIAGNOSIS — I10 Essential (primary) hypertension: Secondary | ICD-10-CM

## 2012-12-09 DIAGNOSIS — J690 Pneumonitis due to inhalation of food and vomit: Secondary | ICD-10-CM

## 2012-12-09 DIAGNOSIS — R531 Weakness: Secondary | ICD-10-CM

## 2012-12-09 DIAGNOSIS — R5381 Other malaise: Secondary | ICD-10-CM

## 2012-12-09 DIAGNOSIS — I4891 Unspecified atrial fibrillation: Secondary | ICD-10-CM

## 2012-12-09 NOTE — Assessment & Plan Note (Signed)
Pt was dx with UTI in hospital and is on levaquin 750 mg, last day 9/18

## 2012-12-09 NOTE — Assessment & Plan Note (Signed)
On replacement;last TSH 9/16- 1.399

## 2012-12-09 NOTE — Assessment & Plan Note (Signed)
Pt had low BP in hospital and bradycardia so Toprol was d/c and norvasc 5mg  started and we will continue thtaat

## 2012-12-09 NOTE — Progress Notes (Signed)
MRN: [id number] Name: Julie Frank  Sex: female Age: 79 y.o. DOB: [birth year]  PSC #: [facility] Facility/Room: [address]B Level Of Care: SNF Provider: Merrilee Seashore D Emergency Contacts: Extended Emergency Contact Information Primary Emergency Contact: Kwong,[name] Address: [address]          [place], [place] Home Phone: [phone] Relation: Daughter  Code Status: DNR  Allergies: Review of patient's allergies indicates no known allergies.  Chief Complaint  Patient presents with  . nursing home admission    HPI: Patient is 79 y.o. female who is a resident here known by every one but me who was treated at hospital for UTI and who has been noted recently as having FTT and losing weight.  Past Medical History  Diagnosis Date  . Mitral insufficiency     CHRONIC  . Hypertension   . Debility     GENERALIZED  . Hyperlipidemia   . OA (osteoarthritis)   . OP (osteoporosis)   . CHF (congestive heart failure)   . History of diastolic dysfunction   . Muscle weakness (generalized)   . Difficulty walking   . Metabolic encephalopathy   . Hypothyroidism   . HOH (hard of hearing)     "extremely"  . SVT (supraventricular tachycardia)     nonsustained  . Heart murmur   . Repeated falls 2010    "3 serious falls; hit head each time; staples 1st 2 times"  . Vascular dementia dx'd 2011  . Peripheral vascular disease   . Dry cough 03/26/11    "chronic; dx'd years ago as allergy related type of thing"  . Bruises easily   . Chronic back pain greater than 3 months duration   . CVA (cerebrovascular accident) 03/2006; 04/2009    residual "speech problems & short term memory decreased"  . PNA (pneumonia)   . UTI (lower urinary tract infection)     Past Surgical History  Procedure Laterality Date  . Removal colon polpys  12/2000    Villous adenoma of the cecum.  . Tonsillectomy and adenoidectomy    . Appendectomy    . Cataract extraction w/  intraocular lens  implant, bilateral  1984  . Dilation and curettage of uterus  1960's    "several"      Medication List       This list is accurate as of: 12/09/12  1:04 PM.  Always use your most recent med list.               amLODipine 5 MG tablet  Commonly known as:  NORVASC  Take 1 tablet (5 mg total) by mouth daily.     aspirin 325 MG tablet  Take 1 tablet (325 mg total) by mouth daily.     atorvastatin 10 MG tablet  Commonly known as:  LIPITOR  Take 10 mg by mouth daily.     CALCIUM 600 + D PO  Take 1 tablet by mouth 2 (two) times daily.     Cranberry 425 MG Caps  Take 1 capsule by mouth 2 (two) times daily.     feeding supplement Liqd  Take 237 mLs by mouth 3 (three) times daily with meals.     levofloxacin 750 MG tablet  Commonly known as:  LEVAQUIN  Take 750 mg by mouth daily.     levothyroxine 25 MCG tablet  Commonly known as:  SYNTHROID, LEVOTHROID  Take 25 mcg by mouth daily.     mirtazapine 15 MG tablet  Commonly known as:  REMERON  Take 15 mg by mouth at bedtime.     omeprazole 20 MG capsule  Commonly known as:  PRILOSEC  Take 20 mg by mouth daily.     polyethylene glycol packet  Commonly known as:  MIRALAX / GLYCOLAX  Take 17 g by mouth every other day. As needed for constipation.        Meds ordered this encounter  Medications  . levofloxacin (LEVAQUIN) 750 MG tablet    Sig: Take 750 mg by mouth daily.     There is no immunization history on file for this patient.  History  Substance Use Topics  . Smoking status: Never Smoker   . Smokeless tobacco: Never Used  . Alcohol Use: No    Family history is noncontributory    Review of Systems  DATA OBTAINED: from patient; very pleasant, hard of hearing, denied any problems, pain    Filed Vitals:   12/09/12 1158  BP: 104/69  Pulse: 72  Temp: 98.3 F (36.8 C)  Resp: 18    Physical Exam  GENERAL APPEARANCE: Alert, conversant. Appropriately groomed. No acute distress.   SKIN: No diaphoresis rash,feet in boots,L heel without breakdown HEENT- unremarkable ;HOH RESPIRATORY: Breathing is even, unlabored. Lung sounds are clear   CARDIOVASCULAR: Heart RRR no murmurs, rubs or gallops. No peripheral edema.   VENOUS: No varicosities. No venous stasis skin changes  GASTROINTESTINAL: Abdomen is soft, non-tender, not distended w/ normal bowel sounds GENITOURINARY: Bladder non tender, not distended  MUSCULOSKELETON - joints or musculature c/w age NEUROLOGIC: . Cranial nerves 2-12 grossly intact. Moves all extremities no tremor. PSYCHIATRIC: pleasant dementia affect, no behavioral issues  Patient Active Problem List   Diagnosis Date Noted  . Vascular dementia   . UTI (lower urinary tract infection) 12/04/2012  . Aspiration pneumonia 12/04/2012  . Hematuria, unspecified 07/30/2012  . Unspecified hypothyroidism 06/30/2012  . Atrial fibrillation 03/10/2012  . Weight loss 02/26/2012  . Febrile illness, acute 03/27/2011  . Weakness generalized 03/27/2011  . Sinusitis acute 03/27/2011  . Bronchitis, acute 03/27/2011  . TIA (transient ischemic attack) 03/27/2011  . Troponin I above reference range 03/27/2011    Class: Chronic  . CVA (cerebrovascular accident)   . SVT (supraventricular tachycardia)   . Mitral insufficiency   . Hypertension   . Chronic diastolic CHF (congestive heart failure)   . History of diastolic dysfunction      CBC    Component Value Date/Time   WBC 9.8 12/07/2012 0935   RBC 4.74 12/07/2012 0935   HGB 13.5 12/07/2012 0935   HCT 42.0 12/07/2012 0935   PLT 182 12/07/2012 0935   MCV 88.6 12/07/2012 0935   LYMPHSABS 0.6* 12/05/2012 0519   MONOABS 0.8 12/05/2012 0519   EOSABS 0.0 12/05/2012 0519   BASOSABS 0.0 12/05/2012 0519    CMP     Component Value Date/Time   NA 139 12/07/2012 0600   K 3.7 12/07/2012 0600   CL 107 12/07/2012 0600   CO2 21 12/07/2012 0600   GLUCOSE 94 12/07/2012 0600   BUN 14 12/07/2012 0600   CREATININE 0.58 12/07/2012  0600   CALCIUM 8.3* 12/07/2012 0600   PROT 6.1 12/05/2012 0519   ALBUMIN 2.7* 12/05/2012 0519   AST 111* 12/05/2012 0519   ALT 64* 12/05/2012 0519   ALKPHOS 143* 12/05/2012 0519   BILITOT 0.7 12/05/2012 0519   GFRNONAA 73* 12/07/2012 0600   GFRAA 84* 12/07/2012 0600    Assessment and Plan  UTI (  lower urinary tract infection) Pt was dx with UTI in hospital and is on levaquin 750 mg, last day 9/18  Aspiration pneumonia Pt had suggestive CXR but neg BC and no sx so IV antibiotics were d/c; it was felt maybe silent aspiration-ST has been consulted  Hypertension Pt had low BP in hospital and bradycardia so Toprol was d/c and norvasc 5mg  started and we will continue thtaat  Vascular dementia At least part of the reason pt had FTT certainly- on feeding supplements and remeron at bedtime.  Unspecified hypothyroidism On replacement;last TSH 9/16- 1.399   Atrial fibrillation Presumably on ASA 325 mg daily instead of blood thinner   DECUBITUS- pt was noted B heel decubiti in hospital but there are resolved; pt is wearing heel lifts   Margit Hanks, MD

## 2012-12-09 NOTE — Assessment & Plan Note (Addendum)
Pt had suggestive CXR but neg BC and no sx so IV antibiotics were d/c; it was felt maybe silent aspiration-ST has been consulted

## 2012-12-09 NOTE — Assessment & Plan Note (Signed)
Presumably on ASA 325 mg daily instead of blood thinner

## 2012-12-09 NOTE — Assessment & Plan Note (Signed)
At least part of the reason pt had FTT certainly- on feeding supplements and remeron at bedtime.

## 2012-12-10 ENCOUNTER — Telehealth: Payer: Self-pay | Admitting: Cardiology

## 2012-12-10 ENCOUNTER — Other Ambulatory Visit (HOSPITAL_COMMUNITY): Payer: Self-pay | Admitting: Internal Medicine

## 2012-12-10 DIAGNOSIS — R1310 Dysphagia, unspecified: Secondary | ICD-10-CM

## 2012-12-10 NOTE — Telephone Encounter (Signed)
Spoke to patient's daughter Britta Mccreedy Dr.Jordan advised unable to evaluate by phone keep appointment 12/23/12.

## 2012-12-10 NOTE — Telephone Encounter (Signed)
New problem  Julie Frank, Patients daughter would like for you to call her before 10 am.

## 2012-12-10 NOTE — Telephone Encounter (Signed)
Returned call to patient's daughter Britta Mccreedy no answer, unable to leave a message no answering machine.

## 2012-12-10 NOTE — Telephone Encounter (Signed)
I can't really evaluate this by phone. Will need to await assessment in office on Oct 1.  Peter Swaziland MD, [facility]

## 2012-12-10 NOTE — Telephone Encounter (Signed)
Spoke to patient's daughter Britta Mccreedy she wanted to let Dr.Jordan know her mother has been in hospital recently.Stated she is back at [facility] now.Stated mother's BNP is over 10,000 and she is concerned it has never been that elevated.Stated she has some swelling in both feet.No sob.Stated she is not on lasix.Stated she wanted to let Dr.Jordan to know and get his advice.Patient has appointment with Dr.Jordan 12/23/12.Message sent to Dr.Jordan.

## 2012-12-10 NOTE — Telephone Encounter (Signed)
See previous 12/10/12 note.

## 2012-12-11 LAB — CULTURE, BLOOD (ROUTINE X 2): Culture: NO GROWTH

## 2012-12-15 ENCOUNTER — Ambulatory Visit (HOSPITAL_COMMUNITY)
Admission: RE | Admit: 2012-12-15 | Discharge: 2012-12-15 | Disposition: A | Discharge status: Home / Self Care | Payer: Medicare Other | Source: Ambulatory Visit | Attending: Internal Medicine | Admitting: Internal Medicine

## 2012-12-15 DIAGNOSIS — R1310 Dysphagia, unspecified: Secondary | ICD-10-CM | POA: Insufficient documentation

## 2012-12-15 DIAGNOSIS — R1313 Dysphagia, pharyngeal phase: Secondary | ICD-10-CM | POA: Insufficient documentation

## 2012-12-15 NOTE — Procedures (Signed)
Objective Swallowing Evaluation: Modified Barium Swallowing Study  Patient Details  Name: Julie Frank MRN: [id number] Date of Birth: [birth year]  Today's Date: 12/15/2012 Time:  -     Past Medical History:  Past Medical History  Diagnosis Date  . Mitral insufficiency     CHRONIC  . Hypertension   . Debility     GENERALIZED  . Hyperlipidemia   . OA (osteoarthritis)   . OP (osteoporosis)   . CHF (congestive heart failure)   . History of diastolic dysfunction   . Muscle weakness (generalized)   . Difficulty walking   . Metabolic encephalopathy   . Hypothyroidism   . HOH (hard of hearing)     "extremely"  . SVT (supraventricular tachycardia)     nonsustained  . Heart murmur   . Repeated falls 2010    "3 serious falls; hit head each time; staples 1st 2 times"  . Vascular dementia dx'd 2011  . Peripheral vascular disease   . Dry cough 03/26/11    "chronic; dx'd years ago as allergy related type of thing"  . Bruises easily   . Chronic back pain greater than 3 months duration   . CVA (cerebrovascular accident) 03/2006; 04/2009    residual "speech problems & short term memory decreased"  . PNA (pneumonia)   . UTI (lower urinary tract infection)    Past Surgical History:  Past Surgical History  Procedure Laterality Date  . Removal colon polpys  12/2000    Villous adenoma of the cecum.  . Tonsillectomy and adenoidectomy    . Appendectomy    . Cataract extraction w/ intraocular lens  implant, bilateral  1984  . Dilation and curettage of uterus  1960's    "several"   HPI:  Pt. is a 79 y.o. seen for outpatient MBS accompanied by her daughter and primary SLP at [facility] Julie Frank.  Pt. hospitalized last week due to UTI.   CXR was performed revealed a small left pleural effusion with left basilar atelectasis or infiltrate, suggestive of HCAP vs aspiration, although patient has no cough, or shortness of breath.  Past medical history of recurrent UTI, CHF, hypertension, recurrent  CVA, failure to thrive, hypothyroidism.  SLP and daughter do not report coughing, throat clearing or wet vocal quality during meals.  Pt. has had a slight and gradual decline in function.  Daughter wanted to have MBS performed to assess current swallow function.        Assessment / Plan / Recommendation Clinical Impression  Dysphagia Diagnosis: Moderate pharyngeal phase dysphagia;Mild pharyngeal phase dysphagia Clinical impression: Pt. exhibited an overall functional oral phase of swallow.  Pharyngeal phase is described as mild-moderate and primarily sensory based impairments leading to delayed swallow initiation.  Silent aspiration with cup sip thin observed and transient penetration with nectar thick barium.  Majority of penetration episodes of nectar were flash (exited laryngeal vestibule spontaneously during swallow) with trace amount remaining on  anterior portion of vestibule.  Verbal cues for a hard cough were effective in clearing majority of penetrates.  She continues to be at mild aspiration risk with nectar given age, history of CVA and recent overall decline in function.  Recommend she continue a mechanical soft diet texture for energy conservation and initiate nectar thick liquids.  Also recommend discussions of comfort feeds if pt. does not like or has decreased intake of thickened liquids to prevent dehydration.       Treatment Recommendation  Defer treatment plan to SLP at (Comment)  Diet Recommendation Dysphagia 3 (Mechanical Soft);Nectar-thick liquid   Liquid Administration via: Cup;No straw Medication Administration: Whole meds with puree Supervision: Patient able to self feed;Full supervision/cueing for compensatory strategies Compensations: Slow rate;Small sips/bites;Hard cough after swallow;Clear throat intermittently Postural Changes and/or Swallow Maneuvers: Seated upright 90 degrees;Upright 30-60 min after meal    Other  Recommendations Oral Care Recommendations: Oral  care BID   Follow Up Recommendations  Skilled Nursing facility    Frequency and Duration        Pertinent Vitals/Pain No indications            Reason for Referral Objectively evaluate swallowing function   Oral Phase Oral Preparation/Oral Phase Oral Phase: WFL   Pharyngeal Phase Pharyngeal Phase Pharyngeal Phase: Impaired Pharyngeal - Nectar Pharyngeal - Nectar Cup: Penetration/Aspiration during swallow;Reduced laryngeal elevation;Delayed swallow initiation;Premature spillage to valleculae;Reduced airway/laryngeal closure Penetration/Aspiration details (nectar cup): Material enters airway, remains ABOVE vocal cords then ejected out;Material enters airway, remains ABOVE vocal cords and not ejected out Pharyngeal - Thin Pharyngeal - Thin Cup: Delayed swallow initiation;Premature spillage to pyriform sinuses;Penetration/Aspiration during swallow;Reduced laryngeal elevation;Reduced anterior laryngeal mobility;Reduced airway/laryngeal closure Penetration/Aspiration details (thin cup): Material enters airway, passes BELOW cords without attempt by patient to eject out (silent aspiration) Pharyngeal - Solids Pharyngeal - Regular: Within functional limits  Cervical Esophageal Phase    GO    Cervical Esophageal Phase Cervical Esophageal Phase: [facility]    Functional Assessment Tool Used: clinical judgement Functional Limitations: Swallowing Swallow Current Status ([id number]): At least 40 percent but less than 60 percent impaired, limited or restricted Swallow Goal Status [phone]): At least 40 percent but less than 60 percent impaired, limited or restricted Swallow Discharge Status [phone]): At least 40 percent but less than 60 percent impaired, limited or restricted    Royce Macadamia M.Ed [employer] [phone]  12/15/2012

## 2012-12-23 ENCOUNTER — Encounter: Payer: Self-pay | Admitting: Cardiology

## 2012-12-23 ENCOUNTER — Ambulatory Visit (INDEPENDENT_AMBULATORY_CARE_PROVIDER_SITE_OTHER): Payer: Medicare Other | Admitting: Cardiology

## 2012-12-23 VITALS — BP 124/60 | HR 87

## 2012-12-23 DIAGNOSIS — I5032 Chronic diastolic (congestive) heart failure: Secondary | ICD-10-CM

## 2012-12-23 DIAGNOSIS — I509 Heart failure, unspecified: Secondary | ICD-10-CM

## 2012-12-23 DIAGNOSIS — I498 Other specified cardiac arrhythmias: Secondary | ICD-10-CM

## 2012-12-23 DIAGNOSIS — I059 Rheumatic mitral valve disease, unspecified: Secondary | ICD-10-CM

## 2012-12-23 DIAGNOSIS — I471 Supraventricular tachycardia: Secondary | ICD-10-CM

## 2012-12-23 DIAGNOSIS — I340 Nonrheumatic mitral (valve) insufficiency: Secondary | ICD-10-CM

## 2012-12-23 NOTE — Progress Notes (Signed)
Van Clines Date of Birth: 1909/09/25   History of Present Illness: Julie Frank is seen today for followup. She is now 79 years old. She was last seen here in December of 2013. She has been residing at [facility]. She has a history of chronic diastolic heart failure and history of SVT. Since her last visit here she was admitted in December 2013 with an acute CVA associated with a fascia, right hemiparesis, and visual loss. She received lytic therapy with significant resolution. More recently she was admitted 2 weeks ago with a urinary tract infection, dehydration, and aspiration. Subsequent swallow evaluation was performed and she is now using a mechanical soft diet and thick-it. She is very hard of hearing. She is seen with her daughter. She has had no increase in cough or shortness of breath. She's had no edema. Her daughter was concerned about her very elevated BNP level during her hospital stay but she had no evidence of volume overload or CHF at the time. She is seen in a wheelchair today.  Current Outpatient Prescriptions on File Prior to Visit  Medication Sig Dispense Refill  . amLODipine (NORVASC) 5 MG tablet Take 1 tablet (5 mg total) by mouth daily.  30 tablet  0  . aspirin 325 MG tablet Take 1 tablet (325 mg total) by mouth daily.  1 tablet  1  . Cranberry 425 MG CAPS Take 1 capsule by mouth 2 (two) times daily.      . feeding supplement (ENSURE CLINICAL STRENGTH) LIQD Take 237 mLs by mouth 3 (three) times daily with meals.      Marland Kitchen levothyroxine (SYNTHROID, LEVOTHROID) 25 MCG tablet Take 25 mcg by mouth daily.        . mirtazapine (REMERON) 15 MG tablet Take 15 mg by mouth at bedtime.      Marland Kitchen omeprazole (PRILOSEC) 20 MG capsule Take 20 mg by mouth daily.      . polyethylene glycol (MIRALAX / GLYCOLAX) packet Take 17 g by mouth every other day. As needed for constipation.       No current facility-administered medications on file prior to visit.    No Known  Allergies  Past Medical History  Diagnosis Date  . Mitral insufficiency     CHRONIC  . Hypertension   . Debility     GENERALIZED  . Hyperlipidemia   . OA (osteoarthritis)   . OP (osteoporosis)   . CHF (congestive heart failure)   . History of diastolic dysfunction   . Muscle weakness (generalized)   . Difficulty walking   . Metabolic encephalopathy   . Hypothyroidism   . HOH (hard of hearing)     "extremely"  . SVT (supraventricular tachycardia)     nonsustained  . Heart murmur   . Repeated falls 2010    "3 serious falls; hit head each time; staples 1st 2 times"  . Vascular dementia dx'd 2011  . Peripheral vascular disease   . Dry cough 03/26/11    "chronic; dx'd years ago as allergy related type of thing"  . Bruises easily   . Chronic back pain greater than 3 months duration   . CVA (cerebrovascular accident) 03/2006; 04/2009    residual "speech problems & short term memory decreased"  . PNA (pneumonia)   . UTI (lower urinary tract infection)     Past Surgical History  Procedure Laterality Date  . Removal colon polpys  12/2000    Villous adenoma of the cecum.  Marland Kitchen  Tonsillectomy and adenoidectomy    . Appendectomy    . Cataract extraction w/ intraocular lens  implant, bilateral  1984  . Dilation and curettage of uterus  1960's    "several"    History  Smoking status  . Never Smoker   Smokeless tobacco  . Never Used    History  Alcohol Use No    Family History  Problem Relation Age of Onset  . Heart disease Mother     Review of Systems: The review of systems is positive for loss of hearing.  She gets around predominantly in a wheelchair. All other systems were reviewed and are negative.  Physical Exam: BP 124/60  Pulse 87 She is an elderly white female in no acute distress. She is hard of hearing. Her HEENT exam is unremarkable. She has no JVD or bruits. Lungs are fairly clear. Cardiac exam reveals a harsh grade 2/6 systolic murmur at apex. Abdomen is  soft and nontender. She has no edema. Neuro: Alert and oriented x3.  LABORATORY DATA: Labs reviewed from her hospitalization in September. ECG demonstrated normal sinus rhythm with a right bundle branch block.  Assessment / Plan: 1. Chronic diastolic heart failure. She appears to be well compensated without evidence of edema.. I think her recent BNP elevation was related to her dehydration and urinary tract infection with decreased renal perfusion. She clearly does not need diuretics at this point. I will plan a followup again in 6 months.  2. SVT, well controlled on metoprolol.  3. History of CVA.  4. Advanced age.

## 2012-12-23 NOTE — Patient Instructions (Addendum)
Continue your current therapy  I will see you in about 6 months   

## 2013-01-05 ENCOUNTER — Encounter: Payer: Self-pay | Admitting: Nurse Practitioner

## 2013-01-05 ENCOUNTER — Non-Acute Institutional Stay (SKILLED_NURSING_FACILITY): Payer: Medicare Other | Admitting: Nurse Practitioner

## 2013-01-05 DIAGNOSIS — R634 Abnormal weight loss: Secondary | ICD-10-CM

## 2013-01-05 DIAGNOSIS — R918 Other nonspecific abnormal finding of lung field: Secondary | ICD-10-CM

## 2013-01-05 DIAGNOSIS — I6350 Cerebral infarction due to unspecified occlusion or stenosis of unspecified cerebral artery: Secondary | ICD-10-CM

## 2013-01-05 DIAGNOSIS — N390 Urinary tract infection, site not specified: Secondary | ICD-10-CM

## 2013-01-05 DIAGNOSIS — F0150 Vascular dementia without behavioral disturbance: Secondary | ICD-10-CM

## 2013-01-05 DIAGNOSIS — I639 Cerebral infarction, unspecified: Secondary | ICD-10-CM

## 2013-01-05 NOTE — Progress Notes (Signed)
Patient ID: Julie Frank, female   DOB: 07/23/1909, 79 y.o.   MRN: [id number]   PCP: Kimber Relic, MD   No Known Allergies  Chief Complaint  Patient presents with  . Medical Managment of Chronic Issues    HPI:  Patient is a 79 y.o. female with PMH of hypertension, CVA, hyperlipidemia, hypothyroid, and weight loss is a long term resident of [facility] is seen today for routine follow up. Pt has chest xray done last night which revealed left lower lob infiltrates and small effusion. cardiomegaly with atelectasis in right lung base; pt without fever or chills, does not reports shortness of breath and does not have cough; pts daughter reported she sounded congestion, pt was placed on Augmentin   Review of Systems:  Review of Systems  Constitutional: Negative for fever, chills and malaise/fatigue.  HENT: Positive for hearing loss. Negative for congestion and sore throat.   Respiratory: Positive for cough. Negative for shortness of breath.   Cardiovascular: Negative for chest pain and leg swelling.  Gastrointestinal: Negative for heartburn, abdominal pain and constipation.  Genitourinary: Negative for dysuria.  Musculoskeletal: Negative for myalgias.  Neurological: Positive for weakness. Negative for headaches.  Psychiatric/Behavioral: Positive for memory loss.     Past Medical History  Diagnosis Date  . Mitral insufficiency     CHRONIC  . Hypertension   . Debility     GENERALIZED  . Hyperlipidemia   . OA (osteoarthritis)   . OP (osteoporosis)   . CHF (congestive heart failure)   . History of diastolic dysfunction   . Muscle weakness (generalized)   . Difficulty walking   . Metabolic encephalopathy   . Hypothyroidism   . HOH (hard of hearing)     "extremely"  . SVT (supraventricular tachycardia)     nonsustained  . Heart murmur   . Repeated falls 2010    "3 serious falls; hit head each time; staples 1st 2 times"  . Vascular dementia dx'd 2011  . Peripheral  vascular disease   . Dry cough 03/26/11    "chronic; dx'd years ago as allergy related type of thing"  . Bruises easily   . Chronic back pain greater than 3 months duration   . CVA (cerebrovascular accident) 03/2006; 04/2009    residual "speech problems & short term memory decreased"  . PNA (pneumonia)   . UTI (lower urinary tract infection)    Past Surgical History  Procedure Laterality Date  . Removal colon polpys  12/2000    Villous adenoma of the cecum.  . Tonsillectomy and adenoidectomy    . Appendectomy    . Cataract extraction w/ intraocular lens  implant, bilateral  1984  . Dilation and curettage of uterus  1960's    "several"   Social History:   reports that she has never smoked. She has never used smokeless tobacco. She reports that she does not drink alcohol or use illicit drugs.  Family History  Problem Relation Age of Onset  . Heart disease Mother     Medications: Patient's Medications  New Prescriptions   No medications on file  Previous Medications   AMLODIPINE (NORVASC) 5 MG TABLET    Take 1 tablet (5 mg total) by mouth daily.   ASPIRIN 325 MG TABLET    Take 1 tablet (325 mg total) by mouth daily.   CRANBERRY 425 MG CAPS    Take 1 capsule by mouth 2 (two) times daily.   FEEDING SUPPLEMENT (ENSURE CLINICAL STRENGTH) LIQD  Take 237 mLs by mouth 3 (three) times daily with meals.   LEVOTHYROXINE (SYNTHROID, LEVOTHROID) 25 MCG TABLET    Take 25 mcg by mouth daily.     MIRTAZAPINE (REMERON) 15 MG TABLET    Take 15 mg by mouth at bedtime.   MULTIPLE VITAMIN (MULTIVITAMIN) CAPSULE    Take 1 capsule by mouth daily.   OMEPRAZOLE (PRILOSEC) 20 MG CAPSULE    Take 20 mg by mouth daily.   POLYETHYLENE GLYCOL (MIRALAX / GLYCOLAX) PACKET    Take 17 g by mouth every other day. As needed for constipation.   SIMVASTATIN (ZOCOR) 20 MG TABLET    Take 20 mg by mouth every evening.  Modified Medications   No medications on file  Discontinued Medications   No medications on file      Physical Exam:  Filed Vitals:   01/05/13 1657  BP: 113/63  Pulse: 67  Temp: 97 F (36.1 C)  Resp: 18  Weight: 99 lb 3.2 oz (44.997 kg)    GENERAL APPEARANCE: thin female in no acute distress.  SKIN: No diaphoresis rash, or stage 2 pressure ulcer on sacrum  HEENT: unremarkable MOUTH/THROAT: Lips w/o lesions. Mouth and throat normal. Tongue moist, w/o lesion.  NECK: No thyroid tenderness, enlargement or nodule  RESPIRATORY: Breathing is even, unlabored. Lung sounds are clear  CARDIOVASCULAR: Heart RRR no murmurs, rubs or gallops. No peripheral edema.  ARTERIAL: radial pulse 2+  GASTROINTESTINAL: Abdomen is soft, non-tender, not distended w/ normal bowel sounds. GENITOURINARY: Bladder non tender, not distended  MUSCULOSKELETAL: No abnormal joints or musculature  NEUROLOGIC: Oriented to self. Moves all extremities no tremor.  PSYCHIATRIC: Mood and affect appropriate to situation, no behavioral issues   Labs reviewed: Basic Metabolic Panel:  Recent Labs  21/30/86 0519 12/06/12 0950 12/07/12 0600  NA 139 138 139  K 4.1 4.2 3.7  CL 105 103 107  CO2 20 22 21   GLUCOSE 130* 76 94  BUN 20 18 14   CREATININE 0.69 0.68 0.58  CALCIUM 8.6 8.6 8.3*   Liver Function Tests:  Recent Labs  03/10/12 1335 12/05/12 0519  AST 29 111*  ALT 12 64*  ALKPHOS 63 143*  BILITOT 0.2* 0.7  PROT 6.9 6.1  ALBUMIN 3.3* 2.7*   No results found for this basename: LIPASE, AMYLASE,  in the last 8760 hours No results found for this basename: AMMONIA,  in the last 8760 hours CBC:  Recent Labs  03/10/12 1335  12/05/12 0519 12/06/12 0950 12/07/12 0935  WBC 7.8  < > 11.1* 9.9 9.8  NEUTROABS 5.1  --  9.7*  --   --   HGB 14.6  < > 12.9 14.3 13.5  HCT 45.1  < > 40.5 43.6 42.0  MCV 89.1  < > 88.6 88.8 88.6  PLT 237  < > 175 156 182  < > = values in this interval not displayed. Cardiac Enzymes:  Recent Labs  03/10/12 1335 12/04/12 2039  TROPONINI <0.30 <0.30   BNP: No  components found with this basename: POCBNP,  CBG:  Recent Labs  12/07/12 0754 12/07/12 1146 12/07/12 1610  GLUCAP 91 144* 108*   CBC with Diff   Result: 07/25/2012 1:15 PM ( Status: F )   WBC 6.8 4.0-10.5 K/uL SLN   RBC 5.70 H 3.87-5.11 MIL/uL SLN   Hemoglobin 16.1 H 12.0-15.0 g/dL SLN   Hematocrit 57.8 H 36.0-46.0 % SLN   MCV 83.5 78.0-100.0 fL SLN   MCH 28.2 26.0-34.0 pg SLN  MCHC 33.8 30.0-36.0 g/dL SLN   RDW 16.1 H 09.6-04.5 % SLN   Platelet Count 236 150-400 K/uL SLN   Granulocyte % 65 43-77 % SLN   Absolute Gran 4.4 1.7-7.7 K/uL SLN   Lymph % 24 12-46 % SLN   Absolute Lymph 1.6 0.7-4.0 K/uL SLN   Mono % 9 3-12 % SLN   Absolute Mono 0.6 0.1-1.0 K/uL SLN   Eos % 2 0-5 % SLN   Absolute Eos 0.1 0.0-0.7 K/uL SLN   Baso % 0 0-1 % SLN   Absolute Baso 0.0 0.0-0.1 K/uL SLN   Smear Review Criteria for review not met SLN   Basic Metabolic Panel   Result: 07/25/2012 1:23 PM ( Status: F )   Sodium 140 135-145 mEq/L SLN   Potassium 4.6 3.5-5.3 mEq/L SLN   Chloride 105 96-112 mEq/L SLN   CO2 29 19-32 mEq/L SLN   Glucose 97 70-99 mg/dL SLN   BUN 23 4-09 mg/dL SLN   Creatinine 8.11 9.14-7.82 mg/dL SLN   Calcium 9.2 9.5-62.1 mg/dL SLN   CBC with Diff    Result: 11/13/2012 3:20 PM   ( Status: F )       WBC 6.1     4.0-10.5 K/uL SLN   RBC 4.57     3.87-5.11 MIL/uL SLN   Hemoglobin 12.8     12.0-15.0 g/dL SLN   Hematocrit 30.8     36.0-46.0 % SLN   MCV 84.7     78.0-100.0 fL SLN   MCH 28.0     26.0-34.0 pg SLN   MCHC 33.1     30.0-36.0 g/dL SLN   RDW 65.7     84.6-96.2 % SLN   Platelet Count 185     150-400 K/uL SLN   Granulocyte % 62     43-77 % SLN   Absolute Gran 3.8     1.7-7.7 K/uL SLN   Lymph % 25     12-46 % SLN   Absolute Lymph 1.6     0.7-4.0 K/uL SLN   Mono % 10     3-12 % SLN   Absolute Mono 0.6     0.1-1.0 K/uL SLN   Eos % 2     0-5 % SLN   Absolute Eos 0.1     0.0-0.7 K/uL SLN   Baso % 1     0-1 % SLN   Absolute Baso 0.0     0.0-0.1 K/uL SLN   Smear  Review Criteria for review not met  SLN   Comprehensive Metabolic Panel    Result: 11/13/2012 3:30 PM   ( Status: F )       Sodium 140     135-145 mEq/L SLN   Potassium 4.2     3.5-5.3 mEq/L SLN   Chloride 110     96-112 mEq/L SLN   CO2 25     19-32 mEq/L SLN   Glucose 87     70-99 mg/dL SLN   BUN 24   H 9-52 mg/dL SLN   Creatinine 8.41     0.50-1.10 mg/dL SLN   Bilirubin, Total 0.4     0.3-1.2 mg/dL SLN   Alkaline Phosphatase 55     39-117 U/L SLN   AST/SGOT 30     0-37 U/L SLN   ALT/SGPT 21     0-35 U/L SLN   Total Protein 5.1   L 6.0-8.3 g/dL SLN   Albumin 2.9   L 3.2-4.4 g/dL  SLN   Calcium 8.4     8.4-10.5 mg/dL SLN   TSH, Ultrasensitive    Result: 11/13/2012 6:28 PM   ( Status: F )       TSH 3.895   Assessment/Plan  1. CVA (cerebrovascular accident) Stable; No worsening of symptoms.  2. Vascular dementia without behavioral disturbance Stable pt functions well in current environement  3. Weight loss Pt with ongoing weight loss despite remeron and supplements; weight now at 99 lbs educated daughter on disease progression with dementia   4. UTI (lower urinary tract infection) Pt completed course of antibiotic for UTI and now Daughter request follow up urine; will not do this at this time  5. Pulmonary infiltrates on CXR Pt placed on augmentin will cont this; will add florastore BID and mucinex DM BID for 1 week  Meet with pts daughter and discusses chest xray results and UTI 45 mins Time TOTAL:  time greater than 50% of total time spent doing pt counseled and coordination of care regarding pts chronic illness, ongoing education and answering question regarding plan of care with staff and pts daughter

## 2013-02-02 ENCOUNTER — Non-Acute Institutional Stay (SKILLED_NURSING_FACILITY): Payer: Medicare Other | Admitting: Nurse Practitioner

## 2013-02-02 DIAGNOSIS — E039 Hypothyroidism, unspecified: Secondary | ICD-10-CM

## 2013-02-02 DIAGNOSIS — E785 Hyperlipidemia, unspecified: Secondary | ICD-10-CM

## 2013-02-02 DIAGNOSIS — I6350 Cerebral infarction due to unspecified occlusion or stenosis of unspecified cerebral artery: Secondary | ICD-10-CM

## 2013-02-02 DIAGNOSIS — I5032 Chronic diastolic (congestive) heart failure: Secondary | ICD-10-CM

## 2013-02-02 DIAGNOSIS — I10 Essential (primary) hypertension: Secondary | ICD-10-CM

## 2013-02-02 DIAGNOSIS — R634 Abnormal weight loss: Secondary | ICD-10-CM

## 2013-02-02 DIAGNOSIS — I639 Cerebral infarction, unspecified: Secondary | ICD-10-CM

## 2013-02-02 DIAGNOSIS — F0150 Vascular dementia without behavioral disturbance: Secondary | ICD-10-CM

## 2013-02-02 DIAGNOSIS — I509 Heart failure, unspecified: Secondary | ICD-10-CM

## 2013-02-02 NOTE — Progress Notes (Signed)
Patient ID: Julie Frank, female   DOB: 1909/09/17, 79 y.o.   MRN: [id number] Nursing Home Location:  [facility]   Place of Service: SNF (31)  PCP: Kimber Relic, MD  No Known Allergies  Chief Complaint  Patient presents with  . Medical Managment of Chronic Issues    HPI:  Patient is a 79 y.o. female with PMH of hypertension, CVA, hyperlipidemia, hypothyroid, and weight loss is a long term resident of [facility] is seen today for routine follow up. There has been no changes in the last month; nursing without concerns. Pt has no complaints  Review of Systems:  Review of Systems  Constitutional: Negative for fever, chills and malaise/fatigue.  HENT: Positive for hearing loss. Negative for congestion and sore throat.   Respiratory: Negative for cough and shortness of breath.   Cardiovascular: Negative for chest pain, palpitations and leg swelling.  Gastrointestinal: Negative for heartburn, abdominal pain and constipation.  Genitourinary: Negative for dysuria.  Musculoskeletal: Negative for myalgias.  Neurological: Positive for weakness. Negative for headaches.  Psychiatric/Behavioral: Positive for memory loss. Negative for depression. The patient does not have insomnia.      Past Medical History  Diagnosis Date  . Mitral insufficiency     CHRONIC  . Hypertension   . Debility     GENERALIZED  . Hyperlipidemia   . OA (osteoarthritis)   . OP (osteoporosis)   . CHF (congestive heart failure)   . History of diastolic dysfunction   . Muscle weakness (generalized)   . Difficulty walking   . Metabolic encephalopathy   . Hypothyroidism   . HOH (hard of hearing)     "extremely"  . SVT (supraventricular tachycardia)     nonsustained  . Heart murmur   . Repeated falls 2010    "3 serious falls; hit head each time; staples 1st 2 times"  . Vascular dementia dx'd 2011  . Peripheral vascular disease   . Dry cough 03/26/11    "chronic; dx'd years ago as  allergy related type of thing"  . Bruises easily   . Chronic back pain greater than 3 months duration   . CVA (cerebrovascular accident) 03/2006; 04/2009    residual "speech problems & short term memory decreased"  . PNA (pneumonia)   . UTI (lower urinary tract infection)    Past Surgical History  Procedure Laterality Date  . Removal colon polpys  12/2000    Villous adenoma of the cecum.  . Tonsillectomy and adenoidectomy    . Appendectomy    . Cataract extraction w/ intraocular lens  implant, bilateral  1984  . Dilation and curettage of uterus  1960's    "several"   Social History:   reports that she has never smoked. She has never used smokeless tobacco. She reports that she does not drink alcohol or use illicit drugs.  Family History  Problem Relation Age of Onset  . Heart disease Mother     Medications: Patient's Medications  New Prescriptions   No medications on file  Previous Medications   AMLODIPINE (NORVASC) 5 MG TABLET    Take 1 tablet (5 mg total) by mouth daily.   ASPIRIN 325 MG TABLET    Take 1 tablet (325 mg total) by mouth daily.   CRANBERRY 425 MG CAPS    Take 1 capsule by mouth 2 (two) times daily.   FEEDING SUPPLEMENT (ENSURE CLINICAL STRENGTH) LIQD    Take 237 mLs by mouth 3 (three) times daily with  meals.   LEVOTHYROXINE (SYNTHROID, LEVOTHROID) 25 MCG TABLET    Take 25 mcg by mouth daily.     MIRTAZAPINE (REMERON) 15 MG TABLET    Take 15 mg by mouth at bedtime.   MULTIPLE VITAMIN (MULTIVITAMIN) CAPSULE    Take 1 capsule by mouth daily.   OMEPRAZOLE (PRILOSEC) 20 MG CAPSULE    Take 20 mg by mouth daily.   POLYETHYLENE GLYCOL (MIRALAX / GLYCOLAX) PACKET    Take 17 g by mouth every other day. As needed for constipation.   SIMVASTATIN (ZOCOR) 20 MG TABLET    Take 20 mg by mouth every evening.  Modified Medications   No medications on file  Discontinued Medications   No medications on file     Physical Exam: Physical Exam  Constitutional: She is  well-developed, well-nourished, and in no distress. No distress.  HENT:  Head: Normocephalic and atraumatic.  Mouth/Throat: Oropharynx is clear and moist. No oropharyngeal exudate.  Eyes: Conjunctivae and EOM are normal. Pupils are equal, round, and reactive to light.  Neck: Normal range of motion. Neck supple. No thyromegaly present.  Cardiovascular: Normal rate, regular rhythm and normal heart sounds.   Pulmonary/Chest: Effort normal and breath sounds normal.  Abdominal: Soft. Bowel sounds are normal. She exhibits no distension.  Musculoskeletal: She exhibits no edema and no tenderness.  Self propels in WC  Lymphadenopathy:    She has no cervical adenopathy.  Neurological: She is alert.  Skin: Skin is warm and dry. She is not diaphoretic.  Psychiatric: Affect normal.    Filed Vitals:   02/02/13 1437  BP: 135/96  Pulse: 80  Temp: 97.1 F (36.2 C)  Resp: 18      Labs reviewed: Basic Metabolic Panel:  Recent Labs  16/10/96 0519 12/06/12 0950 12/07/12 0600  NA 139 138 139  K 4.1 4.2 3.7  CL 105 103 107  CO2 20 22 21   GLUCOSE 130* 76 94  BUN 20 18 14   CREATININE 0.69 0.68 0.58  CALCIUM 8.6 8.6 8.3*   Liver Function Tests:  Recent Labs  03/10/12 1335 12/05/12 0519  AST 29 111*  ALT 12 64*  ALKPHOS 63 143*  BILITOT 0.2* 0.7  PROT 6.9 6.1  ALBUMIN 3.3* 2.7*   No results found for this basename: LIPASE, AMYLASE,  in the last 8760 hours No results found for this basename: AMMONIA,  in the last 8760 hours CBC:  Recent Labs  03/10/12 1335  12/05/12 0519 12/06/12 0950 12/07/12 0935  WBC 7.8  < > 11.1* 9.9 9.8  NEUTROABS 5.1  --  9.7*  --   --   HGB 14.6  < > 12.9 14.3 13.5  HCT 45.1  < > 40.5 43.6 42.0  MCV 89.1  < > 88.6 88.8 88.6  PLT 237  < > 175 156 182  < > = values in this interval not displayed. Cardiac Enzymes:  Recent Labs  03/10/12 1335 12/04/12 2039  TROPONINI <0.30 <0.30   BNP: No components found with this basename: POCBNP,   CBG:  Recent Labs  12/07/12 0754 12/07/12 1146 12/07/12 1610  GLUCAP 91 144* 108*   TSH:  Recent Labs  12/05/12 1433  TSH 1.266   A1C: Lab Results  Component Value Date   HGBA1C 5.8* 03/11/2012   Lipid Panel:  Recent Labs  03/11/12 0400  CHOL 186  HDL 43  LDLCALC 112*  TRIG 153*  CHOLHDL 4.3    CBC with Diff  Result: 07/25/2012 1:15 PM (  Status: F )  WBC 6.8 4.0-10.5 K/uL SLN  RBC 5.70 H 3.87-5.11 MIL/uL SLN  Hemoglobin 16.1 H 12.0-15.0 g/dL SLN  Hematocrit 96.0 H 36.0-46.0 % SLN  MCV 83.5 78.0-100.0 fL SLN  MCH 28.2 26.0-34.0 pg SLN  MCHC 33.8 30.0-36.0 g/dL SLN  RDW 45.4 H 09.8-11.9 % SLN  Platelet Count 236 150-400 K/uL SLN  Granulocyte % 65 43-77 % SLN  Absolute Gran 4.4 1.7-7.7 K/uL SLN  Lymph % 24 12-46 % SLN  Absolute Lymph 1.6 0.7-4.0 K/uL SLN  Mono % 9 3-12 % SLN  Absolute Mono 0.6 0.1-1.0 K/uL SLN  Eos % 2 0-5 % SLN  Absolute Eos 0.1 0.0-0.7 K/uL SLN  Baso % 0 0-1 % SLN  Absolute Baso 0.0 0.0-0.1 K/uL SLN  Smear Review Criteria for review not met SLN  Basic Metabolic Panel  Result: 07/25/2012 1:23 PM ( Status: F )  Sodium 140 135-145 mEq/L SLN  Potassium 4.6 3.5-5.3 mEq/L SLN  Chloride 105 96-112 mEq/L SLN  CO2 29 19-32 mEq/L SLN  Glucose 97 70-99 mg/dL SLN  BUN 23 1-47 mg/dL SLN  Creatinine 8.29 5.62-1.30 mg/dL SLN  Calcium 9.2 8.6-57.8 mg/dL SLN  CBC with Diff  Result: 11/13/2012 3:20 PM ( Status: F )  WBC 6.1 4.0-10.5 K/uL SLN  RBC 4.57 3.87-5.11 MIL/uL SLN  Hemoglobin 12.8 12.0-15.0 g/dL SLN  Hematocrit 46.9 62.9-52.8 % SLN  MCV 84.7 78.0-100.0 fL SLN  MCH 28.0 26.0-34.0 pg SLN  MCHC 33.1 30.0-36.0 g/dL SLN  RDW 41.3 24.4-01.0 % SLN  Platelet Count 185 150-400 K/uL SLN  Granulocyte % 62 43-77 % SLN  Absolute Gran 3.8 1.7-7.7 K/uL SLN  Lymph % 25 12-46 % SLN  Absolute Lymph 1.6 0.7-4.0 K/uL SLN  Mono % 10 3-12 % SLN  Absolute Mono 0.6 0.1-1.0 K/uL SLN  Eos % 2 0-5 % SLN  Absolute Eos 0.1 0.0-0.7 K/uL SLN  Baso % 1 0-1 % SLN   Absolute Baso 0.0 0.0-0.1 K/uL SLN  Smear Review Criteria for review not met SLN  Comprehensive Metabolic Panel  Result: 11/13/2012 3:30 PM ( Status: F )  Sodium 140 135-145 mEq/L SLN  Potassium 4.2 3.5-5.3 mEq/L SLN  Chloride 110 96-112 mEq/L SLN  CO2 25 19-32 mEq/L SLN  Glucose 87 70-99 mg/dL SLN  BUN 24 H 2-72 mg/dL SLN  Creatinine 5.36 6.44-0.34 mg/dL SLN  Bilirubin, Total 0.4 0.3-1.2 mg/dL SLN  Alkaline Phosphatase 55 39-117 U/L SLN  AST/SGOT 30 0-37 U/L SLN  ALT/SGPT 21 0-35 U/L SLN  Total Protein 5.1 L 6.0-8.3 g/dL SLN  Albumin 2.9 L 7.4-2.5 g/dL SLN  Calcium 8.4 9.5-63.8 mg/dL SLN  TSH, Ultrasensitive  Result: 11/13/2012 6:28 PM ( Status: F )  TSH 3.895    Assessment/Plan 1. Chronic diastolic CHF (congestive heart failure) Stable; followed up with cardiology last month; no new interventions  2. CVA (cerebrovascular accident) Stable; currently on ASA  3. Hypertension Patient is stable; continue current regimen. Will monitor and make changes as necessary.  4. Unspecified hypothyroidism Will cont current dose of synthyroid; last TSH WnL on 8.22.14  5. Vascular dementia without behavioral disturbance Stable; pt not on any medications requiring assistance for most ADLs; does well in current setting  6. Weight loss Current weight is 101 up from 99 last month; will cont to monitor; to cont supplements and remeron  7. Hyperlipidemia Will follow up CMP due to elevation in liver enzyme in hospital; pt currently on zocor

## 2013-02-14 ENCOUNTER — Encounter: Payer: Self-pay | Admitting: Nurse Practitioner

## 2013-02-23 ENCOUNTER — Telehealth: Payer: Self-pay | Admitting: Cardiology

## 2013-02-23 NOTE — Telephone Encounter (Signed)
Returned call to patient's daughter [name] no answer.Unable to leave message no answering machine. 

## 2013-02-23 NOTE — Telephone Encounter (Signed)
New Problem:  Pt's daughter is wanting the nurse to go over her mother's meds with her.

## 2013-02-23 NOTE — Telephone Encounter (Signed)
Returned call to patient's daughter Britta Mccreedy no answer.Unable to leave message no answering machine.

## 2013-02-24 ENCOUNTER — Ambulatory Visit: Payer: Medicare Other | Admitting: Podiatrist

## 2013-02-24 NOTE — Telephone Encounter (Signed)
Patient's daughter Britta Mccreedy called no answer.

## 2013-02-24 NOTE — Telephone Encounter (Signed)
Follow Up:  Pt's daughter is returning Cheryl's call. States she will be available until 9 am.. Daughter states she does not have a cell phone or voice mail set up.

## 2013-02-25 NOTE — Telephone Encounter (Signed)
New Problem:  Pt's daughter Manon Banbury is calling Elnita Maxwell back. She does not have another number only the home phone and VM is not set up. Mrs. Eberly apologizes for that.... Britta Mccreedy states she will be available today at that number til 3pm... Please call back before 3pm

## 2013-02-25 NOTE — Telephone Encounter (Signed)
Patient's daughter Britta Mccreedy called no answer.Unable to leave a message no answering machine.

## 2013-02-26 NOTE — Telephone Encounter (Signed)
Returned call to patient's daughter [name] no answer.LMTC. 

## 2013-03-02 ENCOUNTER — Non-Acute Institutional Stay (SKILLED_NURSING_FACILITY): Payer: Medicare Other | Admitting: Nurse Practitioner

## 2013-03-02 DIAGNOSIS — F0150 Vascular dementia without behavioral disturbance: Secondary | ICD-10-CM

## 2013-03-02 DIAGNOSIS — I4891 Unspecified atrial fibrillation: Secondary | ICD-10-CM

## 2013-03-02 DIAGNOSIS — R634 Abnormal weight loss: Secondary | ICD-10-CM

## 2013-03-02 DIAGNOSIS — I639 Cerebral infarction, unspecified: Secondary | ICD-10-CM

## 2013-03-02 DIAGNOSIS — I6350 Cerebral infarction due to unspecified occlusion or stenosis of unspecified cerebral artery: Secondary | ICD-10-CM

## 2013-03-02 DIAGNOSIS — E039 Hypothyroidism, unspecified: Secondary | ICD-10-CM

## 2013-03-02 NOTE — Progress Notes (Signed)
Patient ID: Julie Frank, female   DOB: June 15, 1909, 79 y.o.   MRN: [id number]    Nursing Home Location:  [facility]   Place of Service: SNF (31)  PCP: Kimber Relic, MD  No Known Allergies  Chief Complaint  Patient presents with  . Medical Managment of Chronic Issues    HPI:  Patient is a 79 y.o. female with PMH of hypertension, CVA, hyperlipidemia, hypothyroid, and weight loss is a long term resident of [facility] is seen today for routine follow up.  There has been no changes in the last month; Pt has no complaints and nursing without concerns. Pt with weight gain in the last month; on review Remeron was never restarted from hospital but pt is doing well with supplements and diet. Review of Systems:  Review of Systems  Constitutional: Negative for fever, chills and malaise/fatigue.  HENT: Positive for hearing loss. Negative for congestion and sore throat.   Respiratory: Negative for cough and shortness of breath.   Cardiovascular: Negative for chest pain, palpitations and leg swelling.  Gastrointestinal: Negative for heartburn, abdominal pain and constipation.  Genitourinary: Negative for dysuria.  Musculoskeletal: Negative for myalgias.  Neurological: Negative for dizziness and headaches.  Psychiatric/Behavioral: Positive for memory loss. Negative for depression. The patient does not have insomnia.      Past Medical History  Diagnosis Date  . Mitral insufficiency     CHRONIC  . Hypertension   . Debility     GENERALIZED  . Hyperlipidemia   . OA (osteoarthritis)   . OP (osteoporosis)   . CHF (congestive heart failure)   . History of diastolic dysfunction   . Muscle weakness (generalized)   . Difficulty walking   . Metabolic encephalopathy   . Hypothyroidism   . HOH (hard of hearing)     "extremely"  . SVT (supraventricular tachycardia)     nonsustained  . Heart murmur   . Repeated falls 2010    "3 serious falls; hit head each time;  staples 1st 2 times"  . Vascular dementia dx'd 2011  . Peripheral vascular disease   . Dry cough 03/26/11    "chronic; dx'd years ago as allergy related type of thing"  . Bruises easily   . Chronic back pain greater than 3 months duration   . CVA (cerebrovascular accident) 03/2006; 04/2009    residual "speech problems & short term memory decreased"  . PNA (pneumonia)   . UTI (lower urinary tract infection)    Past Surgical History  Procedure Laterality Date  . Removal colon polpys  12/2000    Villous adenoma of the cecum.  . Tonsillectomy and adenoidectomy    . Appendectomy    . Cataract extraction w/ intraocular lens  implant, bilateral  1984  . Dilation and curettage of uterus  1960's    "several"   Social History:   reports that she has never smoked. She has never used smokeless tobacco. She reports that she does not drink alcohol or use illicit drugs.  Family History  Problem Relation Age of Onset  . Heart disease Mother     Medications: Patient's Medications  New Prescriptions   No medications on file  Previous Medications   ACETAMINOPHEN (TYLENOL) 325 MG TABLET    Take 650 mg by mouth every 12 (twelve) hours.   AMLODIPINE (NORVASC) 5 MG TABLET    Take 1 tablet (5 mg total) by mouth daily.   ASPIRIN 325 MG TABLET    Take  1 tablet (325 mg total) by mouth daily.   CALCIUM CARB-CHOLECALCIFEROL (CALCIUM 600 + D) 600-200 MG-UNIT TABS    Take by mouth.   CRANBERRY 425 MG CAPS    Take 1 capsule by mouth 2 (two) times daily.   FEEDING SUPPLEMENT (ENSURE CLINICAL STRENGTH) LIQD    Take 237 mLs by mouth 3 (three) times daily with meals.   LEVOTHYROXINE (SYNTHROID, LEVOTHROID) 25 MCG TABLET    Take 25 mcg by mouth daily.     MULTIPLE VITAMIN (MULTIVITAMIN) CAPSULE    Take 1 capsule by mouth daily.   OMEPRAZOLE (PRILOSEC) 20 MG CAPSULE    Take 20 mg by mouth daily.   POLYETHYLENE GLYCOL (MIRALAX / GLYCOLAX) PACKET    Take 17 g by mouth every other day. As needed for constipation.    SIMVASTATIN (ZOCOR) 20 MG TABLET    Take 20 mg by mouth every evening.  Modified Medications   No medications on file  Discontinued Medications   MIRTAZAPINE (REMERON) 15 MG TABLET    Take 15 mg by mouth at bedtime.     Physical Exam:  Filed Vitals:   03/02/13 1237  BP: 130/78  Pulse: 72  Temp: 98.8 F (37.1 C)  Resp: 20  Weight: 103 lb (46.72 kg)   Physical Exam  Constitutional: She is well-developed, well-nourished, and in no distress. No distress.  HENT:  Head: Normocephalic and atraumatic.  Nose: Nose normal.  Mouth/Throat: Oropharynx is clear and moist. No oropharyngeal exudate.  Eyes: Conjunctivae and EOM are normal. Pupils are equal, round, and reactive to light.  Neck: Normal range of motion. Neck supple. No thyromegaly present.  Cardiovascular: Normal rate, regular rhythm and normal heart sounds.   Pulmonary/Chest: Effort normal and breath sounds normal.  Abdominal: Soft. Bowel sounds are normal. She exhibits no distension.  Musculoskeletal: She exhibits no edema and no tenderness.  Self propels in WC  Lymphadenopathy:    She has no cervical adenopathy.  Neurological: She is alert.  Skin: Skin is warm and dry. She is not diaphoretic.  Psychiatric: Affect normal.      Labs reviewed: Basic Metabolic Panel:  Recent Labs  40/98/11 0519 12/06/12 0950 12/07/12 0600  NA 139 138 139  K 4.1 4.2 3.7  CL 105 103 107  CO2 20 22 21   GLUCOSE 130* 76 94  BUN 20 18 14   CREATININE 0.69 0.68 0.58  CALCIUM 8.6 8.6 8.3*   Liver Function Tests:  Recent Labs  03/10/12 1335 12/05/12 0519  AST 29 111*  ALT 12 64*  ALKPHOS 63 143*  BILITOT 0.2* 0.7  PROT 6.9 6.1  ALBUMIN 3.3* 2.7*   No results found for this basename: LIPASE, AMYLASE,  in the last 8760 hours No results found for this basename: AMMONIA,  in the last 8760 hours CBC:  Recent Labs  03/10/12 1335  12/05/12 0519 12/06/12 0950 12/07/12 0935  WBC 7.8  < > 11.1* 9.9 9.8  NEUTROABS 5.1  --   9.7*  --   --   HGB 14.6  < > 12.9 14.3 13.5  HCT 45.1  < > 40.5 43.6 42.0  MCV 89.1  < > 88.6 88.8 88.6  PLT 237  < > 175 156 182  < > = values in this interval not displayed. Cardiac Enzymes:  Recent Labs  03/10/12 1335 12/04/12 2039  TROPONINI <0.30 <0.30   BNP: No components found with this basename: POCBNP,  CBG:  Recent Labs  12/07/12 0754 12/07/12 1146 12/07/12  1610  GLUCAP 91 144* 108*   TSH:  Recent Labs  12/05/12 1433  TSH 1.266   A1C: Lab Results  Component Value Date   HGBA1C 5.8* 03/11/2012   Lipid Panel:  Recent Labs  03/11/12 0400  CHOL 186  HDL 43  LDLCALC 112*  TRIG 153*  CHOLHDL 4.3    CBC with Diff   Result: 07/25/2012 1:15 PM ( Status: F )   WBC 6.8 4.0-10.5 K/uL SLN   RBC 5.70 H 3.87-5.11 MIL/uL SLN   Hemoglobin 16.1 H 12.0-15.0 g/dL SLN   Hematocrit 16.1 H 36.0-46.0 % SLN   MCV 83.5 78.0-100.0 fL SLN   MCH 28.2 26.0-34.0 pg SLN   MCHC 33.8 30.0-36.0 g/dL SLN   RDW 09.6 H 04.5-40.9 % SLN   Platelet Count 236 150-400 K/uL SLN   Granulocyte % 65 43-77 % SLN   Absolute Gran 4.4 1.7-7.7 K/uL SLN   Lymph % 24 12-46 % SLN   Absolute Lymph 1.6 0.7-4.0 K/uL SLN   Mono % 9 3-12 % SLN   Absolute Mono 0.6 0.1-1.0 K/uL SLN   Eos % 2 0-5 % SLN   Absolute Eos 0.1 0.0-0.7 K/uL SLN   Baso % 0 0-1 % SLN   Absolute Baso 0.0 0.0-0.1 K/uL SLN   Smear Review Criteria for review not met SLN   Basic Metabolic Panel   Result: 07/25/2012 1:23 PM ( Status: F )   Sodium 140 135-145 mEq/L SLN   Potassium 4.6 3.5-5.3 mEq/L SLN   Chloride 105 96-112 mEq/L SLN   CO2 29 19-32 mEq/L SLN   Glucose 97 70-99 mg/dL SLN   BUN 23 8-11 mg/dL SLN   Creatinine 9.14 7.82-9.56 mg/dL SLN   Calcium 9.2 2.1-30.8 mg/dL SLN   CBC with Diff   Result: 11/13/2012 3:20 PM ( Status: F )   WBC 6.1 4.0-10.5 K/uL SLN   RBC 4.57 3.87-5.11 MIL/uL SLN   Hemoglobin 12.8 12.0-15.0 g/dL SLN   Hematocrit 65.7 84.6-96.2 % SLN   MCV 84.7 78.0-100.0 fL SLN   MCH 28.0 26.0-34.0 pg  SLN   MCHC 33.1 30.0-36.0 g/dL SLN   RDW 95.2 84.1-32.4 % SLN   Platelet Count 185 150-400 K/uL SLN   Granulocyte % 62 43-77 % SLN   Absolute Gran 3.8 1.7-7.7 K/uL SLN   Lymph % 25 12-46 % SLN   Absolute Lymph 1.6 0.7-4.0 K/uL SLN   Mono % 10 3-12 % SLN   Absolute Mono 0.6 0.1-1.0 K/uL SLN   Eos % 2 0-5 % SLN   Absolute Eos 0.1 0.0-0.7 K/uL SLN   Baso % 1 0-1 % SLN   Absolute Baso 0.0 0.0-0.1 K/uL SLN   Smear Review Criteria for review not met SLN   Comprehensive Metabolic Panel   Result: 11/13/2012 3:30 PM ( Status: F )   Sodium 140 135-145 mEq/L SLN   Potassium 4.2 3.5-5.3 mEq/L SLN   Chloride 110 96-112 mEq/L SLN   CO2 25 19-32 mEq/L SLN   Glucose 87 70-99 mg/dL SLN   BUN 24 H 4-01 mg/dL SLN   Creatinine 0.27 2.53-6.64 mg/dL SLN   Bilirubin, Total 0.4 0.3-1.2 mg/dL SLN   Alkaline Phosphatase 55 39-117 U/L SLN   AST/SGOT 30 0-37 U/L SLN   ALT/SGPT 21 0-35 U/L SLN   Total Protein 5.1 L 6.0-8.3 g/dL SLN   Albumin 2.9 L 4.0-3.4 g/dL SLN   Calcium 8.4 7.4-25.9 mg/dL SLN   TSH, Ultrasensitive   Result: 11/13/2012 6:28  PM ( Status: F )   TSH 3.895  Comprehensive Metabolic Panel    Result: 02/03/2013 3:52 PM   ( Status: F )       Sodium 140     135-145 mEq/L SLN   Potassium 4.2     3.5-5.3 mEq/L SLN   Chloride 107     96-112 mEq/L SLN   CO2 26     19-32 mEq/L SLN   Glucose 90     70-99 mg/dL SLN   BUN 22     1-61 mg/dL SLN   Creatinine 0.96     0.50-1.35 mg/dL SLN   Bilirubin, Total 0.4     0.3-1.2 mg/dL SLN   Alkaline Phosphatase 69     39-117 U/L SLN   AST/SGOT 19     0-37 U/L SLN   ALT/SGPT 9     0-53 U/L SLN   Total Protein 5.5   L 6.0-8.3 g/dL SLN   Albumin 3.1   L 0.4-5.4 g/dL SLN   Calcium 8.7     0.9-81.1 mg/dL SLN     Assessment/Plan 1. CVA (cerebrovascular accident) At risk for aspiration, conts on precautions with nectar thick diet; conts on ASA  2. Atrial fibrillation Rate controlled without medications, conts ASA   3. Unspecified hypothyroidism TSH  3.895 in aug; conts synthroid 25 mcg  4. Weight loss -pt was not on Remeron after hospitalization however she is trending up  -conts supplements   5. Vascular dementia without behavioral disturbance - BIMS score of 4/15 indicating severe cognitive impairment; does well in current environment    6. Hyperlipidemia LFTs have returned to normal; will cont Zocor

## 2013-03-02 NOTE — Telephone Encounter (Signed)
Spoke to patient's daughter Britta Mccreedy she stated her mother had pneumonia back in 9/14.Stated she is doing good now and will bring copy of cxr at her next appointment.Also wanting to know if mother needs antibiotics before dental cleaning.Will check with Dr.Jordan 03/04/13 and call her back.

## 2013-03-05 NOTE — Telephone Encounter (Signed)
Returned call to patient's daughter Britta Mccreedy spoke to Dr.Jordan he advised patient does not need antibiotics before dental work.

## 2013-03-24 ENCOUNTER — Encounter: Payer: Self-pay | Admitting: Podiatrist

## 2013-03-24 ENCOUNTER — Ambulatory Visit (INDEPENDENT_AMBULATORY_CARE_PROVIDER_SITE_OTHER): Payer: Medicare Other | Admitting: Podiatrist

## 2013-03-24 VITALS — BP 122/89 | HR 64 | Resp 14

## 2013-03-24 DIAGNOSIS — M79609 Pain in unspecified limb: Secondary | ICD-10-CM

## 2013-03-24 DIAGNOSIS — B351 Tinea unguium: Secondary | ICD-10-CM

## 2013-03-24 DIAGNOSIS — L97509 Non-pressure chronic ulcer of other part of unspecified foot with unspecified severity: Secondary | ICD-10-CM

## 2013-03-24 NOTE — Patient Instructions (Signed)
See written orders

## 2013-03-24 NOTE — Progress Notes (Signed)
Subjective: Patient presents today with her daughter Britta Mccreedy for a routine care appointment. The patient's daughter states that she has been seen at the wound care center and that everything healed up great however the distal tip of the left hallux has a scab that she worries may be an ulcer. The wound care center gave instructions for silver alginate dressings until the scab formed. The scab did  form however it appears loose and possibly ulcerated beneath. Otherwise no change in past medical history, medications or allergies from the previous visit.  Objective: Neurovascular status is unchanged with faintly palpable pedal pulses and neurological sensation intact bilateral. She has decreased circulation to the distal tips of the digits with increased capillary refill time noted. Digital hair growth is absent. A stable ulceration is present in the distal tip of the left hallux. An area of eschar was present which was easily be removed and an ulceration present. No probing to bone, no streaking, no lymphangitis is present it does not appear to be infected. The patient's toenails are thickened, mycotic and uncomfortable to the patient.  Assessment: Chronic ulceration left hallux, mycotic nails  Plan: Carefully removed the loose eschar which revealed an ulceration beneath. Applied Iodosorb and a dry sterile compressive dressing and gave instructions to resume the silver alginate at the rehabilitation facility. I will see her back in 2 weeks to decide if this area has potential to heal with topicals or if she needs another appointment with the wound care center. Toenails also debrided today without complication

## 2013-03-26 ENCOUNTER — Non-Acute Institutional Stay (SKILLED_NURSING_FACILITY): Payer: Medicare Other | Admitting: Nurse Practitioner

## 2013-03-26 ENCOUNTER — Encounter: Payer: Self-pay | Admitting: Nurse Practitioner

## 2013-03-26 DIAGNOSIS — F0150 Vascular dementia without behavioral disturbance: Secondary | ICD-10-CM

## 2013-03-26 DIAGNOSIS — I4891 Unspecified atrial fibrillation: Secondary | ICD-10-CM

## 2013-03-26 DIAGNOSIS — L98499 Non-pressure chronic ulcer of skin of other sites with unspecified severity: Secondary | ICD-10-CM

## 2013-03-26 DIAGNOSIS — I10 Essential (primary) hypertension: Secondary | ICD-10-CM

## 2013-03-26 DIAGNOSIS — R634 Abnormal weight loss: Secondary | ICD-10-CM

## 2013-03-26 DIAGNOSIS — IMO0002 Reserved for concepts with insufficient information to code with codable children: Secondary | ICD-10-CM

## 2013-03-26 DIAGNOSIS — E039 Hypothyroidism, unspecified: Secondary | ICD-10-CM

## 2013-03-26 DIAGNOSIS — I6350 Cerebral infarction due to unspecified occlusion or stenosis of unspecified cerebral artery: Secondary | ICD-10-CM

## 2013-03-26 DIAGNOSIS — I639 Cerebral infarction, unspecified: Secondary | ICD-10-CM

## 2013-03-26 NOTE — Progress Notes (Signed)
Patient ID: Julie Frank, female   DOB: June 21, 1909, 80 y.o.   MRN: [id number]    Nursing Home Location:  [place] of Service: SNF (31)  PCP: Estill Dooms, MD  No Known Allergies  Chief Complaint  Patient presents with  . Medical Managment of Chronic Issues    HPI:  Patient is a 80 y.o. female with PMH of hypertension, CVA, hyperlipidemia, hypothyroid, and weight loss is a long term resident of [facility] is seen today for routine follow up.  There has been no changes in the last month; Pt has no complaints and nursing without concerns.   Review of Systems:  Review of Systems  Constitutional: Negative for fever, chills and malaise/fatigue.  HENT: Positive for hearing loss. Negative for congestion and sore throat.   Respiratory: Negative for cough and shortness of breath.   Cardiovascular: Negative for chest pain and leg swelling.  Gastrointestinal: Negative for heartburn, abdominal pain and constipation.  Genitourinary: Negative for dysuria.  Musculoskeletal: Negative for myalgias.  Neurological: Negative for dizziness and headaches.  Psychiatric/Behavioral: Positive for memory loss. Negative for depression. The patient does not have insomnia.      Past Medical History  Diagnosis Date  . Mitral insufficiency     CHRONIC  . Hypertension   . Debility     GENERALIZED  . Hyperlipidemia   . OA (osteoarthritis)   . OP (osteoporosis)   . CHF (congestive heart failure)   . History of diastolic dysfunction   . Muscle weakness (generalized)   . Difficulty walking   . Metabolic encephalopathy   . Hypothyroidism   . HOH (hard of hearing)     "extremely"  . SVT (supraventricular tachycardia)     nonsustained  . Heart murmur   . Repeated falls 2010    "3 serious falls; hit head each time; staples 1st 2 times"  . Vascular dementia dx'd 2011  . Peripheral vascular disease   . Dry cough 03/26/11    "chronic; dx'd years ago as allergy related  type of thing"  . Bruises easily   . Chronic back pain greater than 3 months duration   . CVA (cerebrovascular accident) 03/2006; 04/2009    residual "speech problems & short term memory decreased"  . PNA (pneumonia)   . UTI (lower urinary tract infection)    Past Surgical History  Procedure Laterality Date  . Removal colon polpys  12/2000    Villous adenoma of the cecum.  . Tonsillectomy and adenoidectomy    . Appendectomy    . Cataract extraction w/ intraocular lens  implant, bilateral  1984  . Dilation and curettage of uterus  1960's    "several"   Social History:   reports that she has never smoked. She has never used smokeless tobacco. She reports that she does not drink alcohol or use illicit drugs.  Family History  Problem Relation Age of Onset  . Heart disease Mother     Medications: Patient's Medications  New Prescriptions   No medications on file  Previous Medications   ACETAMINOPHEN (TYLENOL) 325 MG TABLET    Take 650 mg by mouth every 12 (twelve) hours.   AMLODIPINE (NORVASC) 5 MG TABLET    Take 1 tablet (5 mg total) by mouth daily.   ASPIRIN 325 MG TABLET    Take 1 tablet (325 mg total) by mouth daily.   CALCIUM CARB-CHOLECALCIFEROL (CALCIUM 600 + D) 600-200 MG-UNIT TABS    Take by  mouth.   CRANBERRY 425 MG CAPS    Take 1 capsule by mouth 2 (two) times daily.   FEEDING SUPPLEMENT (ENSURE CLINICAL STRENGTH) LIQD    Take 237 mLs by mouth 3 (three) times daily with meals.   LEVOTHYROXINE (SYNTHROID, LEVOTHROID) 25 MCG TABLET    Take 25 mcg by mouth daily.     MULTIPLE VITAMIN (MULTIVITAMIN) CAPSULE    Take 1 capsule by mouth daily.   OMEPRAZOLE (PRILOSEC) 20 MG CAPSULE    Take 20 mg by mouth daily.   POLYETHYLENE GLYCOL (MIRALAX / GLYCOLAX) PACKET    Take 17 g by mouth every other day. As needed for constipation.   SIMVASTATIN (ZOCOR) 20 MG TABLET    Take 20 mg by mouth every evening.  Modified Medications   No medications on file  Discontinued Medications   No  medications on file     Physical Exam:  Filed Vitals:   03/26/13 1512  BP: 141/71  Pulse: 70  Temp: 97.2 F (36.2 C)  Resp: 20  Weight: 103 lb (46.72 kg)   Physical Exam  Constitutional:  Thin female in NAD  HENT:  Head: Normocephalic and atraumatic.  Nose: Nose normal.  Mouth/Throat: Oropharynx is clear and moist. No oropharyngeal exudate.  Eyes: Conjunctivae and EOM are normal. Pupils are equal, round, and reactive to light.  Neck: Normal range of motion. Neck supple. No thyromegaly present.  Cardiovascular: Normal rate, regular rhythm and normal heart sounds.   Pulmonary/Chest: Effort normal and breath sounds normal.  Abdominal: Soft. Bowel sounds are normal. She exhibits no distension.  Musculoskeletal: She exhibits no edema and no tenderness.  Self propels in WC  Lymphadenopathy:    She has no cervical adenopathy.  Neurological: She is alert.  Skin: Skin is warm and dry.  Psychiatric: Affect normal.      Labs reviewed: Basic Metabolic Panel:  Recent Labs  12/05/12 0519 12/06/12 0950 12/07/12 0600  NA 139 138 139  K 4.1 4.2 3.7  CL 105 103 107  CO2 20 22 21   GLUCOSE 130* 76 94  BUN 20 18 14   CREATININE 0.69 0.68 0.58  CALCIUM 8.6 8.6 8.3*   Liver Function Tests:  Recent Labs  12/05/12 0519  AST 111*  ALT 64*  ALKPHOS 143*  BILITOT 0.7  PROT 6.1  ALBUMIN 2.7*   No results found for this basename: LIPASE, AMYLASE,  in the last 8760 hours No results found for this basename: AMMONIA,  in the last 8760 hours CBC:  Recent Labs  12/05/12 0519 12/06/12 0950 12/07/12 0935  WBC 11.1* 9.9 9.8  NEUTROABS 9.7*  --   --   HGB 12.9 14.3 13.5  HCT 40.5 43.6 42.0  MCV 88.6 88.8 88.6  PLT 175 156 182   Cardiac Enzymes:  Recent Labs  12/04/12 2039  TROPONINI <0.30   BNP: No components found with this basename: POCBNP,  CBG:  Recent Labs  12/07/12 0754 12/07/12 1146 12/07/12 1610  GLUCAP 91 144* 108*   TSH:  Recent Labs   12/05/12 1433  TSH 1.266   A1C: Lab Results  Component Value Date   HGBA1C 5.8* 03/11/2012   CBC with Diff  Result: 07/25/2012 1:15 PM ( Status: F )  WBC 6.8 4.0-10.5 K/uL SLN  RBC 5.70 H 3.87-5.11 MIL/uL SLN  Hemoglobin 16.1 H 12.0-15.0 g/dL SLN  Hematocrit 47.6 H 36.0-46.0 % SLN  MCV 83.5 78.0-100.0 fL SLN  MCH 28.2 26.0-34.0 pg SLN  MCHC 33.8 30.0-36.0 g/dL  SLN  RDW 17.0 H 11.5-15.5 % SLN  Platelet Count 236 150-400 K/uL SLN  Granulocyte % 65 43-77 % SLN  Absolute Gran 4.4 1.7-7.7 K/uL SLN  Lymph % 24 12-46 % SLN  Absolute Lymph 1.6 0.7-4.0 K/uL SLN  Mono % 9 3-12 % SLN  Absolute Mono 0.6 0.1-1.0 K/uL SLN  Eos % 2 0-5 % SLN  Absolute Eos 0.1 0.0-0.7 K/uL SLN  Baso % 0 0-1 % SLN  Absolute Baso 0.0 0.0-0.1 K/uL SLN  Smear Review Criteria for review not met SLN  Basic Metabolic Panel  Result: 11/27/6385 1:23 PM ( Status: F )  Sodium 140 135-145 mEq/L SLN  Potassium 4.6 3.5-5.3 mEq/L SLN  Chloride 105 96-112 mEq/L SLN  CO2 29 19-32 mEq/L SLN  Glucose 97 70-99 mg/dL SLN  BUN 23 6-23 mg/dL SLN  Creatinine 0.77 0.50-1.10 mg/dL SLN  Calcium 9.2 8.4-10.5 mg/dL SLN  CBC with Diff  Result: 11/13/2012 3:20 PM ( Status: F )  WBC 6.1 4.0-10.5 K/uL SLN  RBC 4.57 3.87-5.11 MIL/uL SLN  Hemoglobin 12.8 12.0-15.0 g/dL SLN  Hematocrit 38.7 36.0-46.0 % SLN  MCV 84.7 78.0-100.0 fL SLN  MCH 28.0 26.0-34.0 pg SLN  MCHC 33.1 30.0-36.0 g/dL SLN  RDW 15.5 11.5-15.5 % SLN  Platelet Count 185 150-400 K/uL SLN  Granulocyte % 62 43-77 % SLN  Absolute Gran 3.8 1.7-7.7 K/uL SLN  Lymph % 25 12-46 % SLN  Absolute Lymph 1.6 0.7-4.0 K/uL SLN  Mono % 10 3-12 % SLN  Absolute Mono 0.6 0.1-1.0 K/uL SLN  Eos % 2 0-5 % SLN  Absolute Eos 0.1 0.0-0.7 K/uL SLN  Baso % 1 0-1 % SLN  Absolute Baso 0.0 0.0-0.1 K/uL SLN  Smear Review Criteria for review not met SLN  Comprehensive Metabolic Panel  Result: 5/64/3329 3:30 PM ( Status: F )  Sodium 140 135-145 mEq/L SLN  Potassium 4.2 3.5-5.3 mEq/L SLN   Chloride 110 96-112 mEq/L SLN  CO2 25 19-32 mEq/L SLN  Glucose 87 70-99 mg/dL SLN  BUN 24 H 6-23 mg/dL SLN  Creatinine 0.68 0.50-1.10 mg/dL SLN  Bilirubin, Total 0.4 0.3-1.2 mg/dL SLN  Alkaline Phosphatase 55 39-117 U/L SLN  AST/SGOT 30 0-37 U/L SLN  ALT/SGPT 21 0-35 U/L SLN  Total Protein 5.1 L 6.0-8.3 g/dL SLN  Albumin 2.9 L 3.5-5.2 g/dL SLN  Calcium 8.4 8.4-10.5 mg/dL SLN  TSH, Ultrasensitive  Result: 11/13/2012 6:28 PM ( Status: F )  TSH 3.895  Comprehensive Metabolic Panel  Result: 51/88/4166 3:52 PM ( Status: F )  Sodium 140 135-145 mEq/L SLN  Potassium 4.2 3.5-5.3 mEq/L SLN  Chloride 107 96-112 mEq/L SLN  CO2 26 19-32 mEq/L SLN  Glucose 90 70-99 mg/dL SLN  BUN 22 6-23 mg/dL SLN  Creatinine 0.62 0.50-1.35 mg/dL SLN  Bilirubin, Total 0.4 0.3-1.2 mg/dL SLN  Alkaline Phosphatase 69 39-117 U/L SLN  AST/SGOT 19 0-37 U/L SLN  ALT/SGPT 9 0-53 U/L SLN  Total Protein 5.5 L 6.0-8.3 g/dL SLN  Albumin 3.1 L 3.5-5.2 g/dL SLN  Calcium 8.7 8.4-10.5 mg/dL SLN    Assessment/Plan 1. Weight loss -has remained stable off medications -conts supplements  2. Hypertension -Patient is stable; continue current regimen. Will monitor and make changes as necessary.  3. Ulcer -Ulcer tip left hallux-- tx of silver alginate -being followed by podiatry   4. CVA (cerebrovascular accident) -conts on ASA, zocor  5. Atrial fibrillation -rate controlled -conts on ASA  6. Vascular dementia without behavioral disturbance -stable; not on any medications at  this time  7. Unspecified hypothyroidism -conts on synthroid 25 mcg

## 2013-04-14 ENCOUNTER — Ambulatory Visit: Payer: Medicare Other | Admitting: Podiatrist

## 2013-04-30 ENCOUNTER — Non-Acute Institutional Stay (SKILLED_NURSING_FACILITY): Payer: Medicare Other | Admitting: Nurse Practitioner

## 2013-04-30 DIAGNOSIS — E039 Hypothyroidism, unspecified: Secondary | ICD-10-CM

## 2013-04-30 DIAGNOSIS — I509 Heart failure, unspecified: Secondary | ICD-10-CM

## 2013-04-30 DIAGNOSIS — I639 Cerebral infarction, unspecified: Secondary | ICD-10-CM

## 2013-04-30 DIAGNOSIS — R634 Abnormal weight loss: Secondary | ICD-10-CM

## 2013-04-30 DIAGNOSIS — I5032 Chronic diastolic (congestive) heart failure: Secondary | ICD-10-CM

## 2013-04-30 DIAGNOSIS — I10 Essential (primary) hypertension: Secondary | ICD-10-CM

## 2013-04-30 DIAGNOSIS — F0150 Vascular dementia without behavioral disturbance: Secondary | ICD-10-CM

## 2013-04-30 DIAGNOSIS — I6350 Cerebral infarction due to unspecified occlusion or stenosis of unspecified cerebral artery: Secondary | ICD-10-CM

## 2013-04-30 NOTE — Progress Notes (Signed)
Patient ID: Julie Frank, female   DOB: 11-Sep-1909, 80 y.o.   MRN: [id number]    Nursing Home Location:  [place] of Service: SNF (31)  PCP: Estill Dooms, MD  No Known Allergies  Chief Complaint  Patient presents with  . Medical Managment of Chronic Issues    HPI:  Patient is a 80 y.o. female with PMH of hypertension, CVA, hyperlipidemia, hypothyroid, and weight loss is a long term resident of [facility] is seen today for routine follow up on chronic conditions.  There has been no noted changes in the last month; Pt doing well in current environment and has no complaints and nursing without concerns.  Pt with Chronic ulceration left hallux who is followed by podiatry- no major changes at this time Review of Systems:  Review of Systems  Constitutional: Negative for fever, chills and malaise/fatigue.  HENT: Positive for hearing loss. Negative for congestion.   Respiratory: Negative for cough and shortness of breath.   Cardiovascular: Negative for chest pain and leg swelling.  Gastrointestinal: Negative for heartburn, abdominal pain and constipation.  Genitourinary: Negative for dysuria.  Musculoskeletal: Negative for joint pain and myalgias.  Neurological: Negative for dizziness.  Psychiatric/Behavioral: Positive for memory loss. Negative for depression. The patient does not have insomnia.      Past Medical History  Diagnosis Date  . Mitral insufficiency     CHRONIC  . Hypertension   . Debility     GENERALIZED  . Hyperlipidemia   . OA (osteoarthritis)   . OP (osteoporosis)   . CHF (congestive heart failure)   . History of diastolic dysfunction   . Muscle weakness (generalized)   . Difficulty walking   . Metabolic encephalopathy   . Hypothyroidism   . HOH (hard of hearing)     "extremely"  . SVT (supraventricular tachycardia)     nonsustained  . Heart murmur   . Repeated falls 2010    "3 serious falls; hit head each time; staples  1st 2 times"  . Vascular dementia dx'd 2011  . Peripheral vascular disease   . Dry cough 03/26/11    "chronic; dx'd years ago as allergy related type of thing"  . Bruises easily   . Chronic back pain greater than 3 months duration   . CVA (cerebrovascular accident) 03/2006; 04/2009    residual "speech problems & short term memory decreased"  . PNA (pneumonia)   . UTI (lower urinary tract infection)    Past Surgical History  Procedure Laterality Date  . Removal colon polpys  12/2000    Villous adenoma of the cecum.  . Tonsillectomy and adenoidectomy    . Appendectomy    . Cataract extraction w/ intraocular lens  implant, bilateral  1984  . Dilation and curettage of uterus  1960's    "several"   Social History:   reports that she has never smoked. She has never used smokeless tobacco. She reports that she does not drink alcohol or use illicit drugs.  Family History  Problem Relation Age of Onset  . Heart disease Mother     Medications: Patient's Medications  New Prescriptions   No medications on file  Previous Medications   ACETAMINOPHEN (TYLENOL) 325 MG TABLET    Take 650 mg by mouth every 12 (twelve) hours.   AMLODIPINE (NORVASC) 5 MG TABLET    Take 1 tablet (5 mg total) by mouth daily.   ASPIRIN 325 MG TABLET    Take  1 tablet (325 mg total) by mouth daily.   CALCIUM CARB-CHOLECALCIFEROL (CALCIUM 600 + D) 600-200 MG-UNIT TABS    Take by mouth.   CRANBERRY 425 MG CAPS    Take 1 capsule by mouth 2 (two) times daily.   FEEDING SUPPLEMENT (ENSURE CLINICAL STRENGTH) LIQD    Take 237 mLs by mouth 3 (three) times daily with meals.   LEVOTHYROXINE (SYNTHROID, LEVOTHROID) 25 MCG TABLET    Take 25 mcg by mouth daily.     MULTIPLE VITAMIN (MULTIVITAMIN) CAPSULE    Take 1 capsule by mouth daily.   OMEPRAZOLE (PRILOSEC) 20 MG CAPSULE    Take 20 mg by mouth daily.   POLYETHYLENE GLYCOL (MIRALAX / GLYCOLAX) PACKET    Take 17 g by mouth every other day. As needed for constipation.    SIMVASTATIN (ZOCOR) 20 MG TABLET    Take 20 mg by mouth every evening.  Modified Medications   No medications on file  Discontinued Medications   No medications on file     Physical Exam:  Filed Vitals:   04/30/13 1533  BP: 114/80  Pulse: 60  Temp: 96.6 F (35.9 C)  Resp: 20  Weight: 101 lb (45.813 kg)    Physical Exam  Constitutional:  Thin female in NAD  HENT:  Head: Normocephalic and atraumatic.  Nose: Nose normal.  Mouth/Throat: Oropharynx is clear and moist. No oropharyngeal exudate.  Eyes: Conjunctivae and EOM are normal. Pupils are equal, round, and reactive to light.  Neck: Normal range of motion. Neck supple.  Cardiovascular: Normal rate, regular rhythm and normal heart sounds.   Pulmonary/Chest: Effort normal and breath sounds normal.  Abdominal: Soft. Bowel sounds are normal. She exhibits no distension.  Musculoskeletal: She exhibits no edema and no tenderness.  Neurological: She is alert.  Skin: Skin is warm and dry.  Psychiatric: Affect normal.     Labs reviewed: Basic Metabolic Panel:  Recent Labs  12/05/12 0519 12/06/12 0950 12/07/12 0600  NA 139 138 139  K 4.1 4.2 3.7  CL 105 103 107  CO2 _0 GLUCOSE 130* 76 94  BUN _1 CREATININE 0.69 0.68 0.58  CALCIUM 8.6 8.6 8.3*   Liver Function Tests:  Recent Labs  12/05/12 0519  AST 111*  ALT 64*  ALKPHOS 143*  BILITOT 0.7  PROT 6.1  ALBUMIN 2.7*   No results found for this basename: LIPASE, AMYLASE,  in the last 8760 hours No results found for this basename: AMMONIA,  in the last 8760 hours CBC:  Recent Labs  12/05/12 0519 12/06/12 0950 12/07/12 0935  WBC 11.1* 9.9 9.8  NEUTROABS 9.7*  --   --   HGB 12.9 14.3 13.5  HCT 40.5 43.6 42.0  MCV 88.6 88.8 88.6  PLT 175 156 182   Cardiac Enzymes:  Recent Labs  12/04/12 2039  TROPONINI <0.30   BNP: No components found with this basename: POCBNP,  CBG:  Recent Labs  12/07/12 0754 12/07/12 1146 12/07/12 1610    GLUCAP 91 144* 108*   TSH:  Recent Labs  12/05/12 1433  TSH 1.266   A1C: Lab Results  Component Value Date   HGBA1C 5.8* 03/11/2012   CBC with Diff  Result: 07/25/2012 1:15 PM ( Status: F )  WBC 6.8 4.0-10.5 K/uL SLN  RBC 5.70 H 3.87-5.11 MIL/uL SLN  Hemoglobin 16.1 H 12.0-15.0 g/dL SLN  Hematocrit 47.6 H 36.0-46.0 % SLN  MCV 83.5 78.0-100.0 fL SLN  MCH 28.2 26.0-34.0  pg SLN  MCHC 33.8 30.0-36.0 g/dL SLN  RDW 17.0 H 11.5-15.5 % SLN  Platelet Count 236 150-400 K/uL SLN  Granulocyte % 65 43-77 % SLN  Absolute Gran 4.4 1.7-7.7 K/uL SLN  Lymph % 24 12-46 % SLN  Absolute Lymph 1.6 0.7-4.0 K/uL SLN  Mono % 9 3-12 % SLN  Absolute Mono 0.6 0.1-1.0 K/uL SLN  Eos % 2 0-5 % SLN  Absolute Eos 0.1 0.0-0.7 K/uL SLN  Baso % 0 0-1 % SLN  Absolute Baso 0.0 0.0-0.1 K/uL SLN  Smear Review Criteria for review not met SLN  Basic Metabolic Panel  Result: 3/0/9407 1:23 PM ( Status: F )  Sodium 140 135-145 mEq/L SLN  Potassium 4.6 3.5-5.3 mEq/L SLN  Chloride 105 96-112 mEq/L SLN  CO2 29 19-32 mEq/L SLN  Glucose 97 70-99 mg/dL SLN  BUN 23 6-23 mg/dL SLN  Creatinine 0.77 0.50-1.10 mg/dL SLN  Calcium 9.2 8.4-10.5 mg/dL SLN  CBC with Diff  Result: 11/13/2012 3:20 PM ( Status: F )  WBC 6.1 4.0-10.5 K/uL SLN  RBC 4.57 3.87-5.11 MIL/uL SLN  Hemoglobin 12.8 12.0-15.0 g/dL SLN  Hematocrit 38.7 36.0-46.0 % SLN  MCV 84.7 78.0-100.0 fL SLN  MCH 28.0 26.0-34.0 pg SLN  MCHC 33.1 30.0-36.0 g/dL SLN  RDW 15.5 11.5-15.5 % SLN  Platelet Count 185 150-400 K/uL SLN  Granulocyte % 62 43-77 % SLN  Absolute Gran 3.8 1.7-7.7 K/uL SLN  Lymph % 25 12-46 % SLN  Absolute Lymph 1.6 0.7-4.0 K/uL SLN  Mono % 10 3-12 % SLN  Absolute Mono 0.6 0.1-1.0 K/uL SLN  Eos % 2 0-5 % SLN  Absolute Eos 0.1 0.0-0.7 K/uL SLN  Baso % 1 0-1 % SLN  Absolute Baso 0.0 0.0-0.1 K/uL SLN  Smear Review Criteria for review not met SLN  Comprehensive Metabolic Panel  Result: 6/80/8811 3:30 PM ( Status: F )  Sodium 140 135-145  mEq/L SLN  Potassium 4.2 3.5-5.3 mEq/L SLN  Chloride 110 96-112 mEq/L SLN  CO2 25 19-32 mEq/L SLN  Glucose 87 70-99 mg/dL SLN  BUN 24 H 6-23 mg/dL SLN  Creatinine 0.68 0.50-1.10 mg/dL SLN  Bilirubin, Total 0.4 0.3-1.2 mg/dL SLN  Alkaline Phosphatase 55 39-117 U/L SLN  AST/SGOT 30 0-37 U/L SLN  ALT/SGPT 21 0-35 U/L SLN  Total Protein 5.1 L 6.0-8.3 g/dL SLN  Albumin 2.9 L 3.5-5.2 g/dL SLN  Calcium 8.4 8.4-10.5 mg/dL SLN  TSH, Ultrasensitive  Result: 11/13/2012 6:28 PM ( Status: F )  TSH 3.895  Comprehensive Metabolic Panel  Result: 06/06/9456 3:52 PM ( Status: F )  Sodium 140 135-145 mEq/L SLN  Potassium 4.2 3.5-5.3 mEq/L SLN  Chloride 107 96-112 mEq/L SLN  CO2 26 19-32 mEq/L SLN  Glucose 90 70-99 mg/dL SLN  BUN 22 6-23 mg/dL SLN  Creatinine 0.62 0.50-1.35 mg/dL SLN  Bilirubin, Total 0.4 0.3-1.2 mg/dL SLN  Alkaline Phosphatase 69 39-117 U/L SLN  AST/SGOT 19 0-37 U/L SLN  ALT/SGPT 9 0-53 U/L SLN  Total Protein 5.5 L 6.0-8.3 g/dL SLN  Albumin 3.1 L 3.5-5.2 g/dL SLN  Calcium 8.7 8.4-10.5 mg/dL SLN   Assessment/Plan 1. CVA (cerebrovascular accident) -conts ASA  2. Chronic diastolic CHF (congestive heart failure) -stable at this time   3. Unspecified hypothyroidism -will follow up tsh at this time; conts synthroid 25 mcg  4. Vascular dementia without behavioral disturbance -with memory loss; to be expected at 80 years old does well in current enviroment   5. Weight loss -remains stable- conts supplements  6. Hypertension -Patient is stable; continue current regimen. Will monitor and make changes as necessary.

## 2013-05-05 ENCOUNTER — Ambulatory Visit (INDEPENDENT_AMBULATORY_CARE_PROVIDER_SITE_OTHER): Payer: Medicare Other | Admitting: Podiatrist

## 2013-05-05 ENCOUNTER — Encounter: Payer: Self-pay | Admitting: Podiatrist

## 2013-05-05 VITALS — BP 90/70 | HR 70 | Resp 14 | Ht 60.0 in | Wt 102.0 lb

## 2013-05-05 DIAGNOSIS — L97509 Non-pressure chronic ulcer of other part of unspecified foot with unspecified severity: Secondary | ICD-10-CM

## 2013-05-05 NOTE — Patient Instructions (Signed)
Instructions written out for continued care of ulceration left hallux.  Please call if ?'s about orders written

## 2013-05-05 NOTE — Progress Notes (Signed)
   Subjective: Patient presents today with her daughter Julie Frank for a recheck of the ulceration at the tip of the left great toe. The patient's daughter states that a scab formed on the tip of the toe and it has been treated with foam at [facility].  She denies any redness, swelling or pus out of the toe.  Overall states it feels ok.    Objective: Neurovascular status is unchanged with faintly palpable pedal pulses and neurological sensation intact bilateral. She has decreased circulation to the distal tips of the digits with increased capillary refill time noted. Digital hair growth is absent. A stable ulceration is present in the distal tip of the left hallux. An area of eschar was present which is intact and stable.  No underlying ulceration present.  periwound callus is present and debrided easily.  No probing to bone, no streaking, no lymphangitis is present it does not appear to be infected.   Assessment: Chronic ulceration left hallux,   Plan: Carefully removed the periwound hyperkeratotic tissue. The eschar is stable and left intact.  I explained to Julie Frank that it was the body's own dressing and it should be left intact unless it became ulceratied beneath the eschar.  Today it appears to be stable.  Ersie will most likely always have an ulcer on the toe due to her age and poor chances of healing-- however, we are watching it to make sure there is no infection present.  I recommended continued use of a foam dressing.  Julie Frank is interested in shoes for [place], however with the minimal amount of walking she does and the possibility that this could cause more ulcerations or worsening ulceration, I do not think she needs diabetic shoes.  I will recheck Ayssa in march for her routine care appointment.  Helene Kelp is keeping an eye on the ulcer and will call if any problems or concerns.  Also note Julie Frank asked for specific instructions be written for moisturizing her mother's feet-- this was written  for [facility] nursing in addition to continued dressing change orders.

## 2013-05-12 ENCOUNTER — Telehealth: Payer: Self-pay | Admitting: *Deleted

## 2013-05-12 NOTE — Telephone Encounter (Signed)
Patients daughter called wanting to go over the notes from Dr Valentina Lucks from pt's last visit. Please call her regarding those notes.

## 2013-05-13 NOTE — Telephone Encounter (Signed)
Forward to Lisa 

## 2013-05-27 ENCOUNTER — Ambulatory Visit: Payer: Medicare Other | Admitting: Cardiology

## 2013-06-01 ENCOUNTER — Non-Acute Institutional Stay (SKILLED_NURSING_FACILITY): Payer: Medicare Other | Admitting: Nurse Practitioner

## 2013-06-01 DIAGNOSIS — R531 Weakness: Secondary | ICD-10-CM

## 2013-06-01 DIAGNOSIS — R5383 Other fatigue: Secondary | ICD-10-CM

## 2013-06-01 DIAGNOSIS — R634 Abnormal weight loss: Secondary | ICD-10-CM

## 2013-06-01 DIAGNOSIS — I4891 Unspecified atrial fibrillation: Secondary | ICD-10-CM

## 2013-06-01 DIAGNOSIS — I509 Heart failure, unspecified: Secondary | ICD-10-CM

## 2013-06-01 DIAGNOSIS — R5381 Other malaise: Secondary | ICD-10-CM

## 2013-06-01 DIAGNOSIS — I639 Cerebral infarction, unspecified: Secondary | ICD-10-CM

## 2013-06-01 DIAGNOSIS — I10 Essential (primary) hypertension: Secondary | ICD-10-CM

## 2013-06-01 DIAGNOSIS — I6350 Cerebral infarction due to unspecified occlusion or stenosis of unspecified cerebral artery: Secondary | ICD-10-CM

## 2013-06-01 DIAGNOSIS — F0150 Vascular dementia without behavioral disturbance: Secondary | ICD-10-CM

## 2013-06-01 DIAGNOSIS — I5032 Chronic diastolic (congestive) heart failure: Secondary | ICD-10-CM

## 2013-06-01 NOTE — Progress Notes (Signed)
Patient ID: Julie Frank, female   DOB: 05-Jan-1910, 80 y.o.   MRN: [id number]   Nursing Home Location:  [facility]   Place of Service:    PCP: Estill Dooms, MD  No Known Allergies  Chief Complaint  Patient presents with  . Medical Managment of Chronic Issues    HPI:  Julie Frank a frail,80 year-old pleasant female who is being seen today for medical management and routine follow up of medical problems including CVA, HTN, Afib, vascular dementia, and mitral insufficiency. She is in her usual state of health and without complaint. A pressure ulcer on her right hallux was evaluated by podiatry recently and is being cared for here by wound care staff. Julie Frank weight is stable; she has not lost any weight since last visit. She requires assist from staff for meals. There have been no other issues per patient, her daughter, or staff report.  Review of Systems:  Review of Systems  Constitutional: Negative for fever, weight loss and malaise/fatigue.  HENT: Negative for congestion. Hearing loss: very HOH (chronic)   Eyes: Negative for discharge and redness.  Respiratory: Negative for cough, shortness of breath and wheezing.   Cardiovascular: Negative for chest pain, palpitations and leg swelling.  Gastrointestinal: Negative for heartburn, nausea, vomiting, diarrhea and constipation.  Genitourinary: Negative for dysuria.  Musculoskeletal: Negative for falls, joint pain and myalgias.  Skin: Negative for rash.  Neurological: Negative for dizziness, tremors, sensory change, seizures, loss of consciousness, weakness and headaches.  Endo/Heme/Allergies: Bruises/bleeds easily.  Psychiatric/Behavioral: Positive for memory loss. Negative for hallucinations. The patient is not nervous/anxious and does not have insomnia.      Past Medical History  Diagnosis Date  . Mitral insufficiency     CHRONIC  . Hypertension   . Debility     GENERALIZED  . Hyperlipidemia   . OA  (osteoarthritis)   . OP (osteoporosis)   . CHF (congestive heart failure)   . History of diastolic dysfunction   . Muscle weakness (generalized)   . Difficulty walking   . Metabolic encephalopathy   . Hypothyroidism   . HOH (hard of hearing)     "extremely"  . SVT (supraventricular tachycardia)     nonsustained  . Heart murmur   . Repeated falls 2010    "3 serious falls; hit head each time; staples 1st 2 times"  . Vascular dementia dx'd 2011  . Peripheral vascular disease   . Dry cough 03/26/11    "chronic; dx'd years ago as allergy related type of thing"  . Bruises easily   . Chronic back pain greater than 3 months duration   . CVA (cerebrovascular accident) 03/2006; 04/2009    residual "speech problems & short term memory decreased"  . PNA (pneumonia)   . UTI (lower urinary tract infection)    Past Surgical History  Procedure Laterality Date  . Removal colon polpys  12/2000    Villous adenoma of the cecum.  . Tonsillectomy and adenoidectomy    . Appendectomy    . Cataract extraction w/ intraocular lens  implant, bilateral  1984  . Dilation and curettage of uterus  1960's    "several"   Social History:   reports that she has never smoked. She has never used smokeless tobacco. She reports that she does not drink alcohol or use illicit drugs.  Family History  Problem Relation Age of Onset  . Heart disease Mother     Medications: Patient's Medications  New  Prescriptions   No medications on file  Previous Medications   ACETAMINOPHEN (TYLENOL) 325 MG TABLET    Take 650 mg by mouth every 12 (twelve) hours.   AMLODIPINE (NORVASC) 5 MG TABLET    Take 1 tablet (5 mg total) by mouth daily.   ASPIRIN 325 MG TABLET    Take 1 tablet (325 mg total) by mouth daily.   CALCIUM CARB-CHOLECALCIFEROL (CALCIUM 600 + D) 600-200 MG-UNIT TABS    Take by mouth.   CRANBERRY 425 MG CAPS    Take 1 capsule by mouth 2 (two) times daily.   FEEDING SUPPLEMENT (ENSURE CLINICAL STRENGTH) LIQD     Take 237 mLs by mouth 3 (three) times daily with meals.   LEVOTHYROXINE (SYNTHROID, LEVOTHROID) 25 MCG TABLET    Take 25 mcg by mouth daily.     MULTIPLE VITAMIN (MULTIVITAMIN) CAPSULE    Take 1 capsule by mouth daily.   OMEPRAZOLE (PRILOSEC) 20 MG CAPSULE    Take 20 mg by mouth daily.   POLYETHYLENE GLYCOL (MIRALAX / GLYCOLAX) PACKET    Take 17 g by mouth every other day. As needed for constipation.   SIMVASTATIN (ZOCOR) 20 MG TABLET    Take 20 mg by mouth every evening.  Modified Medications   No medications on file  Discontinued Medications   No medications on file     Physical Exam: Filed Vitals:   06/01/13 1310  BP: 128/63  Pulse: 68  Temp: 97.6 F (36.4 C)  Resp: 20   Physical Exam  Nursing note and vitals reviewed. Constitutional: No distress.  HENT:  Head: Normocephalic and atraumatic.  Mouth/Throat: Oropharynx is clear and moist.  Eyes: Pupils are equal, round, and reactive to light. Right eye exhibits no discharge.  Neck: Normal range of motion. Neck supple.  Cardiovascular: Normal rate, regular rhythm and intact distal pulses.   Pulmonary/Chest: Effort normal and breath sounds normal. No respiratory distress. She has no wheezes. She has no rales.  Abdominal: Soft. Bowel sounds are normal. She exhibits no distension. There is no tenderness. There is no rebound.  Musculoskeletal: Normal range of motion. She exhibits no edema and no tenderness.  Neurological: She is alert.  Skin: Skin is warm, dry and intact. Bruising noted. She is not diaphoretic. No pallor.  Thin, fragile skin  Psychiatric: Mood and affect normal.      Labs reviewed: Basic Metabolic Panel:  Recent Labs  12/05/12 0519 12/06/12 0950 12/07/12 0600  NA 139 138 139  K 4.1 4.2 3.7  CL 105 103 107  CO2 _0 GLUCOSE 130* 76 94  BUN _1 CREATININE 0.69 0.68 0.58  CALCIUM 8.6 8.6 8.3*   Liver Function Tests:  Recent Labs  12/05/12 0519  AST 111*  ALT 64*  ALKPHOS 143*    BILITOT 0.7  PROT 6.1  ALBUMIN 2.7*   No results found for this basename: LIPASE, AMYLASE,  in the last 8760 hours No results found for this basename: AMMONIA,  in the last 8760 hours CBC:  Recent Labs  12/05/12 0519 12/06/12 0950 12/07/12 0935  WBC 11.1* 9.9 9.8  NEUTROABS 9.7*  --   --   HGB 12.9 14.3 13.5  HCT 40.5 43.6 42.0  MCV 88.6 88.8 88.6  PLT 175 156 182   Cardiac Enzymes:  Recent Labs  12/04/12 2039  TROPONINI <0.30   BNP: No components found with this basename: POCBNP,  CBG:  Recent Labs  12/07/12 0754 12/07/12 1146  12/07/12 1610  GLUCAP 91 144* 108*   TSH:  Recent Labs  12/05/12 1433  TSH 1.266   A1C: Lab Results  Component Value Date   HGBA1C 5.8* 03/11/2012  CBC with Diff  Result: 07/25/2012 1:15 PM ( Status: F )  WBC 6.8 4.0-10.5 K/uL SLN  RBC 5.70 H 3.87-5.11 MIL/uL SLN  Hemoglobin 16.1 H 12.0-15.0 g/dL SLN  Hematocrit 47.6 H 36.0-46.0 % SLN  MCV 83.5 78.0-100.0 fL SLN  MCH 28.2 26.0-34.0 pg SLN  MCHC 33.8 30.0-36.0 g/dL SLN  RDW 17.0 H 11.5-15.5 % SLN  Platelet Count 236 150-400 K/uL SLN  Granulocyte % 65 43-77 % SLN  Absolute Gran 4.4 1.7-7.7 K/uL SLN  Lymph % 24 12-46 % SLN  Absolute Lymph 1.6 0.7-4.0 K/uL SLN  Mono % 9 3-12 % SLN  Absolute Mono 0.6 0.1-1.0 K/uL SLN  Eos % 2 0-5 % SLN  Absolute Eos 0.1 0.0-0.7 K/uL SLN  Baso % 0 0-1 % SLN  Absolute Baso 0.0 0.0-0.1 K/uL SLN  Smear Review Criteria for review not met SLN  Basic Metabolic Panel  Result: 1/0/9323 1:23 PM ( Status: F )  Sodium 140 135-145 mEq/L SLN  Potassium 4.6 3.5-5.3 mEq/L SLN  Chloride 105 96-112 mEq/L SLN  CO2 29 19-32 mEq/L SLN  Glucose 97 70-99 mg/dL SLN  BUN 23 6-23 mg/dL SLN  Creatinine 0.77 0.50-1.10 mg/dL SLN  Calcium 9.2 8.4-10.5 mg/dL SLN  CBC with Diff  Result: 11/13/2012 3:20 PM ( Status: F )  WBC 6.1 4.0-10.5 K/uL SLN  RBC 4.57 3.87-5.11 MIL/uL SLN  Hemoglobin 12.8 12.0-15.0 g/dL SLN  Hematocrit 38.7 36.0-46.0 % SLN  MCV 84.7  78.0-100.0 fL SLN  MCH 28.0 26.0-34.0 pg SLN  MCHC 33.1 30.0-36.0 g/dL SLN  RDW 15.5 11.5-15.5 % SLN  Platelet Count 185 150-400 K/uL SLN  Granulocyte % 62 43-77 % SLN  Absolute Gran 3.8 1.7-7.7 K/uL SLN  Lymph % 25 12-46 % SLN  Absolute Lymph 1.6 0.7-4.0 K/uL SLN  Mono % 10 3-12 % SLN  Absolute Mono 0.6 0.1-1.0 K/uL SLN  Eos % 2 0-5 % SLN  Absolute Eos 0.1 0.0-0.7 K/uL SLN  Baso % 1 0-1 % SLN  Absolute Baso 0.0 0.0-0.1 K/uL SLN  Smear Review Criteria for review not met SLN  Comprehensive Metabolic Panel  Result: 5/57/3220 3:30 PM ( Status: F )  Sodium 140 135-145 mEq/L SLN  Potassium 4.2 3.5-5.3 mEq/L SLN  Chloride 110 96-112 mEq/L SLN  CO2 25 19-32 mEq/L SLN  Glucose 87 70-99 mg/dL SLN  BUN 24 H 6-23 mg/dL SLN  Creatinine 0.68 0.50-1.10 mg/dL SLN  Bilirubin, Total 0.4 0.3-1.2 mg/dL SLN  Alkaline Phosphatase 55 39-117 U/L SLN  AST/SGOT 30 0-37 U/L SLN  ALT/SGPT 21 0-35 U/L SLN  Total Protein 5.1 L 6.0-8.3 g/dL SLN  Albumin 2.9 L 3.5-5.2 g/dL SLN  Calcium 8.4 8.4-10.5 mg/dL SLN  TSH, Ultrasensitive  Result: 11/13/2012 6:28 PM ( Status: F )  TSH 3.895  Comprehensive Metabolic Panel  Result: 25/42/7062 3:52 PM ( Status: F )  Sodium 140 135-145 mEq/L SLN  Potassium 4.2 3.5-5.3 mEq/L SLN  Chloride 107 96-112 mEq/L SLN  CO2 26 19-32 mEq/L SLN  Glucose 90 70-99 mg/dL SLN  BUN 22 6-23 mg/dL SLN  Creatinine 0.62 0.50-1.35 mg/dL SLN  Bilirubin, Total 0.4 0.3-1.2 mg/dL SLN  Alkaline Phosphatase 69 39-117 U/L SLN  AST/SGOT 19 0-37 U/L SLN  ALT/SGPT 9 0-53 U/L SLN  Total Protein 5.5 L 6.0-8.3 g/dL SLN  Albumin 3.1 L 3.5-5.2 g/dL SLN  Calcium 8.7 8.4-10.5 mg/dL SLN     Assessment/Plan  1. CVA (cerebrovascular accident) CVA is  without new complications, conts on ASA, zocor, norvasc   2. Hypertension BP stable on current regimen. Will keep same dose of Norvasc 66m as currently taking.  3. Chronic diastolic CHF (congestive heart failure) No recent exacerbation or issues  with fluid volume overload. Will continue to monitor.  4. Atrial fibrillation No episodes of rapid ventricular response reported by staff. Will continue on asa 3222mdaily and monitor for bleeding side effects or intolerance.  5. Vascular dementia without behavioral disturbance Patient continues stable without cognitive decline at this time. Will follow for any new needs.   6. Weight loss Patient's weight stable, although occasional anorexia. Will continue feeding supplement and monitor for additional needs.    Will re-order TSH, CBC, and CMP as they were not drawn last month    Yousef Huge KADebria GarretNP

## 2013-06-14 ENCOUNTER — Telehealth: Payer: Self-pay | Admitting: *Deleted

## 2013-06-14 NOTE — Telephone Encounter (Signed)
Give me a quick buzz.  I have some questions prior to her return appointment.  I returned her call.  She stated that when she went to [facility] the other day they did not have the foam dressing on her foot.  She stated the toe looked black.  She asked if this is normal or if she should be concerned.  She also asked if foam still need to be applied.  I informed her yes foam should be applied.  I told her that's the eschar.  I advised of signs of infection to look out for.  She stated she would hold on to the earlier appointment for now just in case it's need, if not will call and cancel as it gets closer to that date.

## 2013-06-15 ENCOUNTER — Non-Acute Institutional Stay (SKILLED_NURSING_FACILITY): Payer: Medicare Other | Admitting: Nurse Practitioner

## 2013-06-15 ENCOUNTER — Encounter: Payer: Self-pay | Admitting: Nurse Practitioner

## 2013-06-15 DIAGNOSIS — H9209 Otalgia, unspecified ear: Secondary | ICD-10-CM

## 2013-06-15 NOTE — Progress Notes (Signed)
Patient ID: Julie Frank, female   DOB: 15-Feb-1910, 80 y.o.   MRN: [id number]    Nursing Home Location:  [place] of Service: SNF (31)  PCP: Estill Dooms, MD  No Known Allergies  Chief Complaint  Patient presents with  . Acute Visit    HPI:  Julie Frank a frail,80 year-old pleasant female who is being seen today for ear pain per nursing ; pt with a pmh including CVA, HTN, Afib, vascular dementia, and mitral insufficiency who is extremely HOH; per staff pt was complaining of both ears popping and feeling full and pain. Pt denies all these symptoms at time of exam and reports her ears are fine   Review of Systems:  Review of Systems  Constitutional: Negative for fever, chills and malaise/fatigue.  HENT: Positive for hearing loss. Negative for congestion, ear discharge, ear pain, sore throat and tinnitus.   Respiratory: Negative for shortness of breath.   Cardiovascular: Negative for chest pain.  Musculoskeletal: Negative for myalgias.  Neurological: Negative for dizziness, weakness and headaches.     Past Medical History  Diagnosis Date  . Mitral insufficiency     CHRONIC  . Hypertension   . Debility     GENERALIZED  . Hyperlipidemia   . OA (osteoarthritis)   . OP (osteoporosis)   . CHF (congestive heart failure)   . History of diastolic dysfunction   . Muscle weakness (generalized)   . Difficulty walking   . Metabolic encephalopathy   . Hypothyroidism   . HOH (hard of hearing)     "extremely"  . SVT (supraventricular tachycardia)     nonsustained  . Heart murmur   . Repeated falls 2010    "3 serious falls; hit head each time; staples 1st 2 times"  . Vascular dementia dx'd 2011  . Peripheral vascular disease   . Dry cough 03/26/11    "chronic; dx'd years ago as allergy related type of thing"  . Bruises easily   . Chronic back pain greater than 3 months duration   . CVA (cerebrovascular accident) 03/2006; 04/2009    residual  "speech problems & short term memory decreased"  . PNA (pneumonia)   . UTI (lower urinary tract infection)    Past Surgical History  Procedure Laterality Date  . Removal colon polpys  12/2000    Villous adenoma of the cecum.  . Tonsillectomy and adenoidectomy    . Appendectomy    . Cataract extraction w/ intraocular lens  implant, bilateral  1984  . Dilation and curettage of uterus  1960's    "several"   Social History:   reports that she has never smoked. She has never used smokeless tobacco. She reports that she does not drink alcohol or use illicit drugs.  Family History  Problem Relation Age of Onset  . Heart disease Mother     Medications: Patient's Medications  New Prescriptions   No medications on file  Previous Medications   ACETAMINOPHEN (TYLENOL) 325 MG TABLET    Take 650 mg by mouth every 12 (twelve) hours.   AMLODIPINE (NORVASC) 5 MG TABLET    Take 1 tablet (5 mg total) by mouth daily.   ASPIRIN 325 MG TABLET    Take 1 tablet (325 mg total) by mouth daily.   CALCIUM CARB-CHOLECALCIFEROL (CALCIUM 600 + D) 600-200 MG-UNIT TABS    Take by mouth.   CRANBERRY 425 MG CAPS    Take 1 capsule by mouth 2 (  two) times daily.   FEEDING SUPPLEMENT (ENSURE CLINICAL STRENGTH) LIQD    Take 237 mLs by mouth 3 (three) times daily with meals.   LEVOTHYROXINE (SYNTHROID, LEVOTHROID) 25 MCG TABLET    Take 25 mcg by mouth daily.     MULTIPLE VITAMIN (MULTIVITAMIN) CAPSULE    Take 1 capsule by mouth daily.   OMEPRAZOLE (PRILOSEC) 20 MG CAPSULE    Take 20 mg by mouth daily.   POLYETHYLENE GLYCOL (MIRALAX / GLYCOLAX) PACKET    Take 17 g by mouth every other day. As needed for constipation.   SIMVASTATIN (ZOCOR) 20 MG TABLET    Take 20 mg by mouth every evening.  Modified Medications   No medications on file  Discontinued Medications   No medications on file     Physical Exam:  Filed Vitals:   06/15/13 1204  BP: 147/65  Pulse: 74  Temp: 98.3 F (36.8 C)  Resp: 20   Physical  Exam  Nursing note and vitals reviewed. Constitutional: No distress.  HENT:  Head: Normocephalic and atraumatic.  Right Ear: Hearing, tympanic membrane, external ear and ear canal normal. No tenderness.  Left Ear: Hearing, tympanic membrane, external ear and ear canal normal. No tenderness.  Nose: Nose normal.  Mouth/Throat: Oropharynx is clear and moist.  Eyes: Pupils are equal, round, and reactive to light. Right eye exhibits no discharge.  Neck: Normal range of motion. Neck supple.  Cardiovascular: Normal rate and regular rhythm.   Pulmonary/Chest: Effort normal and breath sounds normal. No respiratory distress.  Abdominal: Soft. Bowel sounds are normal. She exhibits no distension.  Musculoskeletal: Normal range of motion. She exhibits no edema and no tenderness.  Neurological: She is alert.  Skin: Skin is warm, dry and intact. Bruising noted. She is not diaphoretic.  Thin, fragile skin  Psychiatric: Mood and affect normal.      Labs reviewed: Basic Metabolic Panel:  Recent Labs  12/05/12 0519 12/06/12 0950 12/07/12 0600  NA 139 138 139  K 4.1 4.2 3.7  CL 105 103 107  CO2 20 22 21   GLUCOSE 130* 76 94  BUN 20 18 14   CREATININE 0.69 0.68 0.58  CALCIUM 8.6 8.6 8.3*   Liver Function Tests:  Recent Labs  12/05/12 0519  AST 111*  ALT 64*  ALKPHOS 143*  BILITOT 0.7  PROT 6.1  ALBUMIN 2.7*   No results found for this basename: LIPASE, AMYLASE,  in the last 8760 hours No results found for this basename: AMMONIA,  in the last 8760 hours CBC:  Recent Labs  12/05/12 0519 12/06/12 0950 12/07/12 0935  WBC 11.1* 9.9 9.8  NEUTROABS 9.7*  --   --   HGB 12.9 14.3 13.5  HCT 40.5 43.6 42.0  MCV 88.6 88.8 88.6  PLT 175 156 182   Cardiac Enzymes:  Recent Labs  12/04/12 2039  TROPONINI <0.30   BNP: No components found with this basename: POCBNP,  CBG:  Recent Labs  12/07/12 0754 12/07/12 1146 12/07/12 1610  GLUCAP 91 144* 108*   TSH:  Recent Labs   12/05/12 1433  TSH 1.266   A1C: Lab Results  Component Value Date   HGBA1C 5.8* 03/11/2012     Assessment/Plan 1. Ear discomfort -canals clear, no pain on exam or able to reproduce  -will have staff cont to monitor

## 2013-06-17 ENCOUNTER — Ambulatory Visit: Payer: Medicare Other | Admitting: Podiatrist

## 2013-06-23 ENCOUNTER — Ambulatory Visit: Payer: Medicare Other | Admitting: Cardiology

## 2013-07-01 ENCOUNTER — Ambulatory Visit: Payer: Medicare Other | Admitting: Podiatrist

## 2013-07-08 ENCOUNTER — Ambulatory Visit (INDEPENDENT_AMBULATORY_CARE_PROVIDER_SITE_OTHER): Payer: Medicare Other | Admitting: Podiatrist

## 2013-07-08 ENCOUNTER — Encounter: Payer: Self-pay | Admitting: *Deleted

## 2013-07-08 ENCOUNTER — Encounter: Payer: Self-pay | Admitting: Podiatrist

## 2013-07-08 ENCOUNTER — Ambulatory Visit: Payer: Medicare Other | Admitting: Podiatrist

## 2013-07-08 VITALS — BP 119/59 | HR 71 | Resp 16

## 2013-07-08 DIAGNOSIS — B351 Tinea unguium: Secondary | ICD-10-CM

## 2013-07-08 DIAGNOSIS — M79609 Pain in unspecified limb: Secondary | ICD-10-CM

## 2013-07-08 MED ORDER — CEPHALEXIN 500 MG PO CAPS: 500.0000 mg | ORAL_CAPSULE | Freq: Three times a day (TID) | ORAL | Status: DC

## 2013-07-08 NOTE — Patient Instructions (Signed)
Call the Wound care Center-- Dr. Jerline Pain.. If you need a referral, please let me know and I am happy to do this for you

## 2013-07-08 NOTE — Progress Notes (Signed)
   Subjective: Patient presents today with her daughter Julie Frank for a routine nail debridement and for recheck of the ulceration at the tip of the left great toe. The patient's daughter states that a scab formed on the tip of the toe it appears more red and swollen than in the past.  No pain is associated with the toe according to [facility].  Objective: Neurovascular status is unchanged with faintly palpable pedal pulses and neurological sensation intact bilateral. She has decreased circulation to the distal tips of the digits with increased capillary refill time noted. Digital hair growth is absent. A stable ulceration is present in the distal tip of the left hallux. An area of eschar was present which is intact and stable. No underlying ulceration present. Redness and swelling is present on the toe-- very mild in nature.  All toenails are elongated, thick and dystrophic.    Assessment: symptomatic mycotic toenails;  Chronic ulceration left hallux,  Plan: toenail debridment carefully carried out today.  Recommended she see Dr. Jerline Pain at the wound center for the toe wound to see if anything else could be done.  I started her on Keflex until she can get into see Dr. Jerline Pain-  Julie Frank is going to set up  The appointment herself.  I will see Julie Frank back in 3 months for routine care.  Instructions to put gauze and tape on the toe were written.

## 2013-07-09 ENCOUNTER — Non-Acute Institutional Stay (SKILLED_NURSING_FACILITY): Payer: Medicare Other | Admitting: Nurse Practitioner

## 2013-07-09 DIAGNOSIS — F0150 Vascular dementia without behavioral disturbance: Secondary | ICD-10-CM

## 2013-07-09 DIAGNOSIS — I10 Essential (primary) hypertension: Secondary | ICD-10-CM

## 2013-07-09 DIAGNOSIS — R234 Changes in skin texture: Secondary | ICD-10-CM

## 2013-07-09 DIAGNOSIS — I6350 Cerebral infarction due to unspecified occlusion or stenosis of unspecified cerebral artery: Secondary | ICD-10-CM

## 2013-07-09 DIAGNOSIS — E039 Hypothyroidism, unspecified: Secondary | ICD-10-CM

## 2013-07-09 DIAGNOSIS — R634 Abnormal weight loss: Secondary | ICD-10-CM

## 2013-07-09 DIAGNOSIS — I639 Cerebral infarction, unspecified: Secondary | ICD-10-CM

## 2013-07-09 NOTE — Progress Notes (Signed)
Patient ID: Julie Frank, female   DOB: 1910-01-14, 80 y.o.   MRN: [id number]    Nursing Home Location:  [facility]: SNF (31)  PCP: Estill Dooms, MD  No Known Allergies  Chief Complaint  Patient presents with  . Medical Management of Chronic Issues    HPI:  Julie Frank a frail,80 year-old pleasant female who is being seen today for routine follow up on chronic conditions ; pt with a pmh including CVA, HTN, Afib, vascular dementia, and mitral insufficiency who is extremely HOH; pt has no complaints today and staff without concerns. There has been no major changes in chronic conditions, pt is with ongoing great toe wound now with eschar who was seeing podiatry  Review of Systems:  Review of Systems  Constitutional: Negative for fever and weight loss.  HENT: Positive for hearing loss (very HOH (chronic)). Negative for congestion, ear discharge, ear pain and tinnitus.   Eyes: Negative for discharge and redness.  Respiratory: Negative for cough, shortness of breath and wheezing.   Cardiovascular: Negative for chest pain, palpitations and leg swelling.  Gastrointestinal: Negative for heartburn, nausea, vomiting, diarrhea and constipation.  Genitourinary: Negative for dysuria.  Musculoskeletal: Negative for falls, joint pain and myalgias.  Skin: Negative for rash.  Neurological: Negative for dizziness, tremors, sensory change, seizures, loss of consciousness and headaches.  Psychiatric/Behavioral: Positive for memory loss. Negative for hallucinations. The patient is not nervous/anxious and does not have insomnia.      Past Medical History  Diagnosis Date  . Mitral insufficiency     CHRONIC  . Hypertension   . Debility     GENERALIZED  . Hyperlipidemia   . OA (osteoarthritis)   . OP (osteoporosis)   . CHF (congestive heart failure)   . History of diastolic dysfunction   . Muscle weakness (generalized)   . Difficulty walking   .  Metabolic encephalopathy   . Hypothyroidism   . HOH (hard of hearing)     "extremely"  . SVT (supraventricular tachycardia)     nonsustained  . Heart murmur   . Repeated falls 2010    "3 serious falls; hit head each time; staples 1st 2 times"  . Vascular dementia dx'd 2011  . Peripheral vascular disease   . Dry cough 03/26/11    "chronic; dx'd years ago as allergy related type of thing"  . Bruises easily   . Chronic back pain greater than 3 months duration   . CVA (cerebrovascular accident) 03/2006; 04/2009    residual "speech problems & short term memory decreased"  . PNA (pneumonia)   . UTI (lower urinary tract infection)    Past Surgical History  Procedure Laterality Date  . Removal colon polpys  12/2000    Villous adenoma of the cecum.  . Tonsillectomy and adenoidectomy    . Appendectomy    . Cataract extraction w/ intraocular lens  implant, bilateral  1984  . Dilation and curettage of uterus  1960's    "several"   Social History:   reports that she has never smoked. She has never used smokeless tobacco. She reports that she does not drink alcohol or use illicit drugs.  Family History  Problem Relation Age of Onset  . Heart disease Mother     Medications: Patient's Medications  New Prescriptions   No medications on file  Previous Medications   ACETAMINOPHEN (TYLENOL) 325 MG TABLET    Take 650 mg by mouth every  12 (twelve) hours.   AMLODIPINE (NORVASC) 5 MG TABLET    Take 1 tablet (5 mg total) by mouth daily.   ASPIRIN 325 MG TABLET    Take 1 tablet (325 mg total) by mouth daily.   CALCIUM CARB-CHOLECALCIFEROL (CALCIUM 600 + D) 600-200 MG-UNIT TABS    Take by mouth.   CEPHALEXIN (KEFLEX) 500 MG CAPSULE    Take 1 capsule (500 mg total) by mouth 3 (three) times daily.   CRANBERRY 425 MG CAPS    Take 1 capsule by mouth 2 (two) times daily.   FEEDING SUPPLEMENT (ENSURE CLINICAL STRENGTH) LIQD    Take 237 mLs by mouth 3 (three) times daily with meals.   LEVOTHYROXINE  (SYNTHROID, LEVOTHROID) 25 MCG TABLET    Take 25 mcg by mouth daily.     MULTIPLE VITAMIN (MULTIVITAMIN) CAPSULE    Take 1 capsule by mouth daily.   OMEPRAZOLE (PRILOSEC) 20 MG CAPSULE    Take 20 mg by mouth daily.   POLYETHYLENE GLYCOL (MIRALAX / GLYCOLAX) PACKET    Take 17 g by mouth every other day. As needed for constipation.   SIMVASTATIN (ZOCOR) 20 MG TABLET    Take 20 mg by mouth every evening.  Modified Medications   No medications on file  Discontinued Medications   No medications on file     Physical Exam:  Filed Vitals:   07/09/13 1240  BP: 125/89  Pulse: 78  Temp: 97 F (36.1 C)  Resp: 20  Weight: 102 lb (46.267 kg)  Physical Exam  Nursing note and vitals reviewed. Constitutional: No distress.  HENT:  Head: Normocephalic and atraumatic.  Mouth/Throat: Oropharynx is clear and moist.  Eyes: Pupils are equal, round, and reactive to light. Right eye exhibits no discharge.  Neck: Normal range of motion. Neck supple.  Cardiovascular: Normal rate, regular rhythm and intact distal pulses.   Pulmonary/Chest: Effort normal and breath sounds normal. No respiratory distress. She has no wheezes. She has no rales.  Abdominal: Soft. Bowel sounds are normal. She exhibits no distension. There is no tenderness. There is no rebound.  Musculoskeletal: Normal range of motion. She exhibits no edema and no tenderness.  Neurological: She is alert.  Skin: Skin is warm and dry. She is not diaphoretic. No pallor.  Psychiatric: Mood and affect normal.       Labs reviewed: Basic Metabolic Panel:  Recent Labs  12/05/12 0519 12/06/12 0950 12/07/12 0600  NA 139 138 139  K 4.1 4.2 3.7  CL 105 103 107  CO2 20 22 21   GLUCOSE 130* 76 94  BUN 20 18 14   CREATININE 0.69 0.68 0.58  CALCIUM 8.6 8.6 8.3*   Liver Function Tests:  Recent Labs  12/05/12 0519  AST 111*  ALT 64*  ALKPHOS 143*  BILITOT 0.7  PROT 6.1  ALBUMIN 2.7*   No results found for this basename: LIPASE,  AMYLASE,  in the last 8760 hours No results found for this basename: AMMONIA,  in the last 8760 hours CBC:  Recent Labs  12/05/12 0519 12/06/12 0950 12/07/12 0935  WBC 11.1* 9.9 9.8  NEUTROABS 9.7*  --   --   HGB 12.9 14.3 13.5  HCT 40.5 43.6 42.0  MCV 88.6 88.8 88.6  PLT 175 156 182   Cardiac Enzymes:  Recent Labs  12/04/12 2039  TROPONINI <0.30   BNP: No components found with this basename: POCBNP,  CBG:  Recent Labs  12/07/12 0754 12/07/12 1146 12/07/12 1610  GLUCAP 91  144* 108*   TSH:  Recent Labs  12/05/12 1433  TSH 1.266   A1C: Lab Results  Component Value Date   HGBA1C 5.8* 03/11/2012  TSH, Ultrasensitive    Result: 12/08/2012 7:45 PM   ( Status: F )       TSH 1.399 Lipid Profile    Result: 07/08/2013 4:39 PM   ( Status: F )       Cholesterol 163     0-200 mg/dL SLN C Triglyceride 165   H <150 mg/dL SLN   HDL Cholesterol 50     >39 mg/dL SLN   Total Chol/HDL Ratio 3.3      Ratio SLN   VLDL Cholesterol (Calc) 33     0-40 mg/dL SLN   LDL Cholesterol (Calc) 80     0-99 mg/dL SLN C Liver Profile    Result: 07/08/2013 4:39 PM   ( Status: F )       Bilirubin, Total 0.4     0.2-1.2 mg/dL SLN   Bilirubin, Direct 0.1     0.0-0.3 mg/dL SLN   Indirect Bilirubin 0.3     0.2-1.2 mg/dL SLN   Alkaline Phosphatase 68     39-117 U/L SLN   AST/SGOT 20     0-37 U/L SLN   ALT/SGPT 9     0-35 U/L SLN   Total Protein 6.4     6.0-8.3 g/dL SLN   Albumin 3.6     3.5-5.2 g/dL SLN   Assessment/Plan 1. Eschar of toe -was following with podiatry now they have recommended wound care center for ongoing monitoring, currently on keflex for 3 days   2. CVA (cerebrovascular accident) -no signs of worsening symptoms, pt conts to take ASA, Zocor, and norvasc  3. Hypertension -well controlled on current medications -will follow up bmp 4. Unspecified hypothyroidism -follow up TSH, conts on synthroid 25 mcgs  5. Vascular dementia without behavioral disturbance -remains  stable without significant change in the past months  6. Weight loss -remains unchanged, pt weight stable at 102 lbs  7. hyperlipidemia -triglycerides elevated but overall cholesterol well controlled, do not recommend diet modifications due to age and weight loss, will cont zocor, LDL at [employer] ordered: cbc, bmp, tsh-third time written

## 2013-07-21 ENCOUNTER — Encounter (HOSPITAL_BASED_OUTPATIENT_CLINIC_OR_DEPARTMENT_OTHER): Payer: Medicare Other | Attending: General Surgery

## 2013-07-28 ENCOUNTER — Encounter (HOSPITAL_BASED_OUTPATIENT_CLINIC_OR_DEPARTMENT_OTHER): Payer: Medicare Other | Attending: General Surgery

## 2013-07-28 DIAGNOSIS — L89899 Pressure ulcer of other site, unspecified stage: Secondary | ICD-10-CM | POA: Insufficient documentation

## 2013-07-28 DIAGNOSIS — L8992 Pressure ulcer of unspecified site, stage 2: Secondary | ICD-10-CM | POA: Insufficient documentation

## 2013-07-28 DIAGNOSIS — L89609 Pressure ulcer of unspecified heel, unspecified stage: Secondary | ICD-10-CM | POA: Insufficient documentation

## 2013-08-09 ENCOUNTER — Ambulatory Visit: Payer: Medicare Other | Admitting: Cardiology

## 2013-08-10 ENCOUNTER — Non-Acute Institutional Stay (SKILLED_NURSING_FACILITY): Payer: Medicare Other | Admitting: Nurse Practitioner

## 2013-08-10 DIAGNOSIS — F0150 Vascular dementia without behavioral disturbance: Secondary | ICD-10-CM

## 2013-08-10 DIAGNOSIS — I639 Cerebral infarction, unspecified: Secondary | ICD-10-CM

## 2013-08-10 DIAGNOSIS — I5032 Chronic diastolic (congestive) heart failure: Secondary | ICD-10-CM

## 2013-08-10 DIAGNOSIS — I509 Heart failure, unspecified: Secondary | ICD-10-CM

## 2013-08-10 DIAGNOSIS — R634 Abnormal weight loss: Secondary | ICD-10-CM

## 2013-08-10 DIAGNOSIS — I4891 Unspecified atrial fibrillation: Secondary | ICD-10-CM

## 2013-08-10 DIAGNOSIS — E039 Hypothyroidism, unspecified: Secondary | ICD-10-CM

## 2013-08-10 DIAGNOSIS — I6350 Cerebral infarction due to unspecified occlusion or stenosis of unspecified cerebral artery: Secondary | ICD-10-CM

## 2013-08-10 NOTE — Progress Notes (Signed)
Patient ID: Julie Frank, female   DOB: 01/29/1910, 80 y.o.   MRN: [id number]    Nursing Home Location:  [facility] [facility] Service: SNF (31)  PCP: Estill Dooms, MD  No Known Allergies  Chief Complaint  Patient presents with  . Medical Management of Chronic Issues    HPI:  Julie Frank a frail 80 year-old pleasant female who is being seen today for routine follow up on chronic conditions ; pt with a pmh including CVA, HTN, Afib, vascular dementia, and mitral insufficiency who is extremely HOH; pt has no complaints today and staff without concerns. Pt conts to follow with wound care due to ulcer on great toe  Review of Systems:  Review of Systems  Constitutional: Negative for fever and weight loss.  HENT: Positive for hearing loss (very HOH (chronic)). Negative for congestion, ear discharge, ear pain and tinnitus.   Eyes: Negative for discharge and redness.  Respiratory: Negative for cough, shortness of breath and wheezing.   Cardiovascular: Negative for chest pain, palpitations and leg swelling.  Gastrointestinal: Negative for heartburn, nausea, vomiting, diarrhea and constipation.  Genitourinary: Negative for dysuria.  Musculoskeletal: Negative for falls, joint pain and myalgias.  Skin: Negative for rash.  Neurological: Negative for dizziness, tremors, sensory change, seizures, loss of consciousness and headaches.  Psychiatric/Behavioral: Positive for memory loss. Negative for hallucinations. The patient is not nervous/anxious and does not have insomnia.      Past Medical History  Diagnosis Date  . Mitral insufficiency     CHRONIC  . Hypertension   . Debility     GENERALIZED  . Hyperlipidemia   . OA (osteoarthritis)   . OP (osteoporosis)   . CHF (congestive heart failure)   . History of diastolic dysfunction   . Muscle weakness (generalized)   . Difficulty walking   . Metabolic encephalopathy   . Hypothyroidism   . HOH (hard of hearing)       "extremely"  . SVT (supraventricular tachycardia)     nonsustained  . Heart murmur   . Repeated falls 2010    "3 serious falls; hit head each time; staples 1st 2 times"  . Vascular dementia dx'd 2011  . Peripheral vascular disease   . Dry cough 03/26/11    "chronic; dx'd years ago as allergy related type of thing"  . Bruises easily   . Chronic back pain greater than 3 months duration   . CVA (cerebrovascular accident) 03/2006; 04/2009    residual "speech problems & short term memory decreased"  . PNA (pneumonia)   . UTI (lower urinary tract infection)    Past Surgical History  Procedure Laterality Date  . Removal colon polpys  12/2000    Villous adenoma of the cecum.  . Tonsillectomy and adenoidectomy    . Appendectomy    . Cataract extraction w/ intraocular lens  implant, bilateral  1984  . Dilation and curettage of uterus  1960's    "several"   Social History:   reports that she has never smoked. She has never used smokeless tobacco. She reports that she does not drink alcohol or use illicit drugs.  Family History  Problem Relation Age of Onset  . Heart disease Mother     Medications: Patient's Medications  New Prescriptions   No medications on file  Previous Medications   ACETAMINOPHEN (TYLENOL) 325 MG TABLET    Take 650 mg by mouth every 12 (twelve) hours.   AMLODIPINE (NORVASC) 5  MG TABLET    Take 1 tablet (5 mg total) by mouth daily.   ASPIRIN 325 MG TABLET    Take 1 tablet (325 mg total) by mouth daily.   CALCIUM CARB-CHOLECALCIFEROL (CALCIUM 600 + D) 600-200 MG-UNIT TABS    Take by mouth.   FEEDING SUPPLEMENT (ENSURE CLINICAL STRENGTH) LIQD    Take 237 mLs by mouth 3 (three) times daily with meals.   LEVOTHYROXINE (SYNTHROID, LEVOTHROID) 25 MCG TABLET    Take 25 mcg by mouth daily.     MULTIPLE VITAMIN (MULTIVITAMIN) CAPSULE    Take 1 capsule by mouth daily.   OMEPRAZOLE (PRILOSEC) 20 MG CAPSULE    Take 20 mg by mouth daily.   POLYETHYLENE GLYCOL (MIRALAX /  GLYCOLAX) PACKET    Take 17 g by mouth every other day. As needed for constipation.   SIMVASTATIN (ZOCOR) 20 MG TABLET    Take 20 mg by mouth every evening.  Modified Medications   No medications on file  Discontinued Medications   CEPHALEXIN (KEFLEX) 500 MG CAPSULE    Take 1 capsule (500 mg total) by mouth 3 (three) times daily.   CRANBERRY 425 MG CAPS    Take 1 capsule by mouth 2 (two) times daily.     Physical Exam:  Filed Vitals:   08/10/13 1123  BP: 131/75  Pulse: 70  Temp: 97.4 F (36.3 C)  Resp: 20  Weight: 101 lb 9.6 oz (46.085 kg)    Physical Exam  Nursing note and vitals reviewed. Constitutional: No distress.  HENT:  Head: Normocephalic and atraumatic.  Mouth/Throat: Oropharynx is clear and moist.  Eyes: Pupils are equal, round, and reactive to light.  Neck: Normal range of motion. Neck supple.  Cardiovascular: Normal rate, regular rhythm and intact distal pulses.   Pulmonary/Chest: Effort normal and breath sounds normal.  Abdominal: Soft. Bowel sounds are normal.  Musculoskeletal: Normal range of motion. She exhibits no edema and no tenderness.  Neurological: She is alert.  Skin: Skin is warm and dry. She is not diaphoretic.  Psychiatric: Mood and affect normal.     Labs reviewed: Basic Metabolic Panel:  Recent Labs  12/05/12 0519 12/06/12 0950 12/07/12 0600  NA 139 138 139  K 4.1 4.2 3.7  CL 105 103 107  CO2 20 22 21   GLUCOSE 130* 76 94  BUN 20 18 14   CREATININE 0.69 0.68 0.58  CALCIUM 8.6 8.6 8.3*   Liver Function Tests:  Recent Labs  12/05/12 0519  AST 111*  ALT 64*  ALKPHOS 143*  BILITOT 0.7  PROT 6.1  ALBUMIN 2.7*   No results found for this basename: LIPASE, AMYLASE,  in the last 8760 hours No results found for this basename: AMMONIA,  in the last 8760 hours CBC:  Recent Labs  12/05/12 0519 12/06/12 0950 12/07/12 0935  WBC 11.1* 9.9 9.8  NEUTROABS 9.7*  --   --   HGB 12.9 14.3 13.5  HCT 40.5 43.6 42.0  MCV 88.6 88.8  88.6  PLT 175 156 182   Cardiac Enzymes:  Recent Labs  12/04/12 2039  TROPONINI <0.30   BNP: No components found with this basename: POCBNP,  CBG:  Recent Labs  12/07/12 0754 12/07/12 1146 12/07/12 1610  GLUCAP 91 144* 108*   TSH:  Recent Labs  12/05/12 1433  TSH 1.266   TSH, Ultrasensitive  Result: 12/08/2012 7:45 PM ( Status: F )  TSH 1.399  Lipid Profile  Result: 07/08/2013 4:39 PM ( Status: F )  Cholesterol 163 0-200 mg/dL SLN C  Triglyceride 165 H <150 mg/dL SLN  HDL Cholesterol 50 >39 mg/dL SLN  Total Chol/HDL Ratio 3.3 Ratio SLN  VLDL Cholesterol (Calc) 33 0-40 mg/dL SLN  LDL Cholesterol (Calc) 80 0-99 mg/dL SLN C  Liver Profile  Result: 07/08/2013 4:39 PM ( Status: F )  Bilirubin, Total 0.4 0.2-1.2 mg/dL SLN  Bilirubin, Direct 0.1 0.0-0.3 mg/dL SLN  Indirect Bilirubin 0.3 0.2-1.2 mg/dL SLN  Alkaline Phosphatase 68 39-117 U/L SLN  AST/SGOT 20 0-37 U/L SLN  ALT/SGPT 9 0-35 U/L SLN  Total Protein 6.4 6.0-8.3 g/dL SLN  Albumin 3.6 3.5-5.2 g/dL SLN  CBC with Diff    Result: 07/12/2013 1:09 PM   ( Status: F )       WBC 7.1     4.0-10.5 K/uL [facility]   RBC 4.72     3.87-5.11 MIL/uL [facility]   Hemoglobin 14.1     12.0-15.0 g/dL [facility]   Hematocrit 44.2     36.0-46.0 % [facility]   MCV 93.6     78.0-100.0 fL [facility]   MCH 29.9     26.0-34.0 pg [facility]   MCHC 31.9     30.0-36.0 g/dL [facility]   RDW 15.2     11.5-15.5 % [facility]   Platelet Count 228     150-400 K/uL [facility]   Granulocyte % 72     43-77 % [facility]   Absolute Gran 5.1     1.7-7.7 K/uL [facility]   Lymph % 17     12-46 % [facility]   Absolute Lymph 1.2     0.7-4.0 K/uL [facility]   Mono % 10     3-12 % [facility]   Absolute Mono 0.7     0.1-1.0 K/uL [facility]   Eos % 1     0-5 % [facility]   Absolute Eos 0.1     0.0-0.7 K/uL [facility]   Baso % 0     0-1 % [facility]   Absolute Baso 0.0     0.0-0.1 K/uL [facility]   Smear Review Criteria for review not met  [facility]   Basic Metabolic Panel    Result: 07/12/2013 1:31 PM   ( Status: F )       Sodium 144     135-145 mEq/L [facility]   Potassium 4.7      3.5-5.3 mEq/L [facility]   Chloride 106     96-112 mEq/L [facility]   CO2 25     19-32 mEq/L [facility]   Glucose 116   H 70-99 mg/dL [facility]   BUN 23     6-23 mg/dL [facility]   Creatinine 0.71     0.50-1.10 mg/dL [facility]   Calcium 9.2     8.4-10.5 mg/dL [facility]   TSH, Ultrasensitive    Result: 07/13/2013 2:50 AM   ( Status: F )       TSH 2.499     0.350-4.500 uIU/mL SLN    Assessment/Plan 1. Chronic diastolic CHF (congestive heart failure) -stable  2. CVA (cerebrovascular accident) -stable; conts  on asa, statin and norvasc  3. Atrial fibrillation -stable, rate controlled, conts on ASA  4. Unspecified hypothyroidism -conts to be stable on levothroixine 25 mcgs  5. Vascular dementia without behavioral disturbance -without significant change  6. Weight loss Weight remains stable over past few months

## 2013-08-25 ENCOUNTER — Encounter (HOSPITAL_BASED_OUTPATIENT_CLINIC_OR_DEPARTMENT_OTHER): Payer: Medicare Other | Attending: General Surgery

## 2013-08-25 DIAGNOSIS — L89609 Pressure ulcer of unspecified heel, unspecified stage: Secondary | ICD-10-CM | POA: Insufficient documentation

## 2013-08-25 DIAGNOSIS — L89899 Pressure ulcer of other site, unspecified stage: Secondary | ICD-10-CM | POA: Insufficient documentation

## 2013-08-25 DIAGNOSIS — L8992 Pressure ulcer of unspecified site, stage 2: Secondary | ICD-10-CM | POA: Insufficient documentation

## 2013-08-25 DIAGNOSIS — L89309 Pressure ulcer of unspecified buttock, unspecified stage: Secondary | ICD-10-CM | POA: Insufficient documentation

## 2013-08-25 DIAGNOSIS — L8993 Pressure ulcer of unspecified site, stage 3: Secondary | ICD-10-CM | POA: Insufficient documentation

## 2013-09-17 ENCOUNTER — Non-Acute Institutional Stay (SKILLED_NURSING_FACILITY): Payer: Medicare Other | Admitting: Nurse Practitioner

## 2013-09-17 DIAGNOSIS — IMO0002 Reserved for concepts with insufficient information to code with codable children: Secondary | ICD-10-CM

## 2013-09-17 DIAGNOSIS — F0150 Vascular dementia without behavioral disturbance: Secondary | ICD-10-CM

## 2013-09-17 DIAGNOSIS — E039 Hypothyroidism, unspecified: Secondary | ICD-10-CM

## 2013-09-17 DIAGNOSIS — L98499 Non-pressure chronic ulcer of skin of other sites with unspecified severity: Secondary | ICD-10-CM

## 2013-09-17 DIAGNOSIS — I5032 Chronic diastolic (congestive) heart failure: Secondary | ICD-10-CM

## 2013-09-17 DIAGNOSIS — I509 Heart failure, unspecified: Secondary | ICD-10-CM

## 2013-09-17 DIAGNOSIS — R634 Abnormal weight loss: Secondary | ICD-10-CM

## 2013-09-17 DIAGNOSIS — I10 Essential (primary) hypertension: Secondary | ICD-10-CM

## 2013-09-17 NOTE — Progress Notes (Signed)
Patient ID: Julie Frank, female   DOB: 04/12/1909, 80 y.o.   MRN: [id number]    Nursing Home Location:  [place] of Service: SNF (31)  PCP: Estill Dooms, MD  No Known Allergies  Chief Complaint  Patient presents with  . Medical Management of Chronic Issues    HPI:  Ms. Leeson a frail 80 year-old pleasant female with a pmh including CVA, HTN, Afib, vascular dementia, and mitral insufficiency who is extremely HOH;who is being seen today for routine follow up on chronic conditions,  Pt reports she has been doing well. RD following due to low weight and risk for nutritional deficiency. Was followed by podiatry for wound care but now has been referred to wound care clinic.   Review of Systems:  Review of Systems  Constitutional: Negative for fever, chills and weight loss.  HENT: Positive for hearing loss (very HOH (chronic)). Negative for congestion and tinnitus.   Eyes: Negative for blurred vision.  Respiratory: Negative for cough and shortness of breath.   Cardiovascular: Negative for chest pain, palpitations and leg swelling.  Gastrointestinal: Negative for heartburn, abdominal pain, diarrhea and constipation.  Genitourinary: Negative for dysuria.  Musculoskeletal: Negative for falls, joint pain and myalgias.  Neurological: Negative for dizziness, tremors, sensory change and headaches.  Psychiatric/Behavioral: Positive for memory loss. Negative for depression. The patient is not nervous/anxious and does not have insomnia.      Past Medical History  Diagnosis Date  . Mitral insufficiency     CHRONIC  . Hypertension   . Debility     GENERALIZED  . Hyperlipidemia   . OA (osteoarthritis)   . OP (osteoporosis)   . CHF (congestive heart failure)   . History of diastolic dysfunction   . Muscle weakness (generalized)   . Difficulty walking   . Metabolic encephalopathy   . Hypothyroidism   . HOH (hard of hearing)     "extremely"  . SVT  (supraventricular tachycardia)     nonsustained  . Heart murmur   . Repeated falls 2010    "3 serious falls; hit head each time; staples 1st 2 times"  . Vascular dementia dx'd 2011  . Peripheral vascular disease   . Dry cough 03/26/11    "chronic; dx'd years ago as allergy related type of thing"  . Bruises easily   . Chronic back pain greater than 3 months duration   . CVA (cerebrovascular accident) 03/2006; 04/2009    residual "speech problems & short term memory decreased"  . PNA (pneumonia)   . UTI (lower urinary tract infection)    Past Surgical History  Procedure Laterality Date  . Removal colon polpys  12/2000    Villous adenoma of the cecum.  . Tonsillectomy and adenoidectomy    . Appendectomy    . Cataract extraction w/ intraocular lens  implant, bilateral  1984  . Dilation and curettage of uterus  1960's    "several"   Social History:   reports that she has never smoked. She has never used smokeless tobacco. She reports that she does not drink alcohol or use illicit drugs.  Family History  Problem Relation Age of Onset  . Heart disease Mother     Medications: Patient's Medications  New Prescriptions   No medications on file  Previous Medications   ACETAMINOPHEN (TYLENOL) 325 MG TABLET    Take 650 mg by mouth every 12 (twelve) hours.   AMLODIPINE (NORVASC) 5 MG TABLET  Take 1 tablet (5 mg total) by mouth daily.   ASPIRIN 325 MG TABLET    Take 1 tablet (325 mg total) by mouth daily.   CALCIUM CARB-CHOLECALCIFEROL (CALCIUM 600 + D) 600-200 MG-UNIT TABS    Take by mouth.   FEEDING SUPPLEMENT (ENSURE CLINICAL STRENGTH) LIQD    Take 237 mLs by mouth 3 (three) times daily with meals.   LEVOTHYROXINE (SYNTHROID, LEVOTHROID) 25 MCG TABLET    Take 25 mcg by mouth daily.     MULTIPLE VITAMIN (MULTIVITAMIN) CAPSULE    Take 1 capsule by mouth daily.   OMEPRAZOLE (PRILOSEC) 20 MG CAPSULE    Take 20 mg by mouth daily.   POLYETHYLENE GLYCOL (MIRALAX / GLYCOLAX) PACKET    Take 17  g by mouth every other day. As needed for constipation.   SIMVASTATIN (ZOCOR) 20 MG TABLET    Take 20 mg by mouth every evening.  Modified Medications   No medications on file  Discontinued Medications   No medications on file     Physical Exam:  Filed Vitals:   09/17/13 1722  BP: 139/77  Pulse: 74  Temp: 97.5 F (36.4 C)  Resp: 20  Weight: 103 lb (46.72 kg)    Physical Exam  Constitutional: No distress.  Thin body habitus   HENT:  Head: Normocephalic and atraumatic.  Mouth/Throat: Oropharynx is clear and moist. No oropharyngeal exudate.  Eyes: Conjunctivae and EOM are normal. Pupils are equal, round, and reactive to light.  Neck: Normal range of motion. Neck supple.  Cardiovascular: Normal rate, regular rhythm and normal heart sounds.   Pulmonary/Chest: Effort normal and breath sounds normal. No respiratory distress.  Abdominal: Soft. Bowel sounds are normal. She exhibits no distension. There is no tenderness.  Musculoskeletal: She exhibits no tenderness.  Pressure reduction boots, bilaterally dsg to right foot CDI  Neurological: She is alert.  Skin: Skin is warm and dry. She is not diaphoretic.  Psychiatric: She exhibits abnormal recent memory and abnormal remote memory.     Labs reviewed: Basic Metabolic Panel:  Recent Labs  12/05/12 0519 12/06/12 0950 12/07/12 0600  NA 139 138 139  K 4.1 4.2 3.7  CL 105 103 107  CO2 20 22 21   GLUCOSE 130* 76 94  BUN 20 18 14   CREATININE 0.69 0.68 0.58  CALCIUM 8.6 8.6 8.3*   Liver Function Tests:  Recent Labs  12/05/12 0519  AST 111*  ALT 64*  ALKPHOS 143*  BILITOT 0.7  PROT 6.1  ALBUMIN 2.7*   No results found for this basename: LIPASE, AMYLASE,  in the last 8760 hours No results found for this basename: AMMONIA,  in the last 8760 hours CBC:  Recent Labs  12/05/12 0519 12/06/12 0950 12/07/12 0935  WBC 11.1* 9.9 9.8  NEUTROABS 9.7*  --   --   HGB 12.9 14.3 13.5  HCT 40.5 43.6 42.0  MCV 88.6 88.8  88.6  PLT 175 156 182   Cardiac Enzymes:  Recent Labs  12/04/12 2039  TROPONINI <0.30   BNP: No components found with this basename: POCBNP,  CBG:  Recent Labs  12/07/12 0754 12/07/12 1146 12/07/12 1610  GLUCAP 91 144* 108*   TSH:  Recent Labs  12/05/12 1433  TSH 1.266   A1C: Lab Results  Component Value Date   HGBA1C 5.8* 03/11/2012    TSH, Ultrasensitive  Result: 12/08/2012 7:45 PM ( Status: F )  TSH 1.399  Lipid Profile  Result: 07/08/2013 4:39 PM ( Status: F )  Cholesterol 163 0-200 mg/dL SLN C  Triglyceride 165 H <150 mg/dL SLN  HDL Cholesterol 50 >39 mg/dL SLN  Total Chol/HDL Ratio 3.3 Ratio SLN  VLDL Cholesterol (Calc) 33 0-40 mg/dL SLN  LDL Cholesterol (Calc) 80 0-99 mg/dL SLN C  Liver Profile  Result: 07/08/2013 4:39 PM ( Status: F )  Bilirubin, Total 0.4 0.2-1.2 mg/dL SLN  Bilirubin, Direct 0.1 0.0-0.3 mg/dL SLN  Indirect Bilirubin 0.3 0.2-1.2 mg/dL SLN  Alkaline Phosphatase 68 39-117 U/L SLN  AST/SGOT 20 0-37 U/L SLN  ALT/SGPT 9 0-35 U/L SLN  Total Protein 6.4 6.0-8.3 g/dL SLN  Albumin 3.6 3.5-5.2 g/dL SLN  CBC with Diff  Result: 07/12/2013 1:09 PM ( Status: F )  WBC 7.1 4.0-10.5 K/uL [facility]  RBC 4.72 3.87-5.11 MIL/uL [facility]  Hemoglobin 14.1 12.0-15.0 g/dL [facility]  Hematocrit 44.2 36.0-46.0 % M[facility]  MCV 93.6 78.0-100.0 fL M[facility]  MCH 29.9 26.0-34.0 pg M[facility]  MCHC 31.9 30.0-36.0 g/dL [facility]  RDW 15.2 11.5-15.5 % [facility]  Platelet Count 228 150-400 K/uL M[facility]  Granulocyte % 72 43-77 % [facility]  Absolute Gran 5.1 1.7-7.7 K/uL [facility]  Lymph % 17 12-46 % [facility]  Absolute Lymph 1.2 0.7-4.0 K/uL [facility]  Mono % 10 3-12 % [facility]  Absolute Mono 0.7 0.1-1.0 K/uL [facility]  Eos % 1 0-5 % [facility]  Absolute Eos 0.1 0.0-0.7 K/uL [facility]  Baso % 0 0-1 % [facility]  Absolute Baso 0.0 0.0-0.1 K/uL [facility]  Smear Review Criteria for review not met [facility]  Basic Metabolic Panel  Result: 9/48/0165 1:31 PM ( Status: F )  Sodium 144 135-145 mEq/L [facility]  Potassium 4.7 3.5-5.3 mEq/L [facility]  Chloride 106 96-112 mEq/L [facility]  CO2 25  19-32 mEq/L [facility]  Glucose 116 H 70-99 mg/dL [facility]  BUN 23 6-23 mg/dL [facility]  Creatinine 0.71 0.50-1.10 mg/dL [facility]  Calcium 9.2 8.4-10.5 mg/dL [facility]  TSH, Ultrasensitive  Result: 07/13/2013 2:50 AM ( Status: F )  TSH 2.499 0.350-4.500 uIU/mL SLN    Assessment/Plan 1. Essential hypertension -conts on norvasc with good effect  2. Chronic diastolic CHF (congestive heart failure) Without signs of worsening heart failure   3. Unspecified hypothyroidism -conts on synthroid 25 mcgs  4. Vascular dementia without behavioral disturbance -stable, pt does well in current environment involved in activities   5. Weight loss Weight has remained stable, pt reports decreased PO intake but taking supplements   6. Ulcer On great toe, following with wound care, dsg cdi

## 2013-09-22 ENCOUNTER — Encounter (HOSPITAL_BASED_OUTPATIENT_CLINIC_OR_DEPARTMENT_OTHER): Payer: Medicare Other | Attending: General Surgery

## 2013-09-22 DIAGNOSIS — L89609 Pressure ulcer of unspecified heel, unspecified stage: Secondary | ICD-10-CM | POA: Insufficient documentation

## 2013-09-22 DIAGNOSIS — L8992 Pressure ulcer of unspecified site, stage 2: Secondary | ICD-10-CM | POA: Diagnosis not present

## 2013-09-22 DIAGNOSIS — L8993 Pressure ulcer of unspecified site, stage 3: Secondary | ICD-10-CM | POA: Insufficient documentation

## 2013-09-22 DIAGNOSIS — L89899 Pressure ulcer of other site, unspecified stage: Secondary | ICD-10-CM | POA: Insufficient documentation

## 2013-09-22 DIAGNOSIS — L89309 Pressure ulcer of unspecified buttock, unspecified stage: Secondary | ICD-10-CM | POA: Insufficient documentation

## 2013-09-27 ENCOUNTER — Ambulatory Visit: Payer: Medicare Other | Admitting: Cardiology

## 2013-10-06 DIAGNOSIS — L8993 Pressure ulcer of unspecified site, stage 3: Secondary | ICD-10-CM | POA: Diagnosis not present

## 2013-10-06 DIAGNOSIS — L89609 Pressure ulcer of unspecified heel, unspecified stage: Secondary | ICD-10-CM | POA: Diagnosis not present

## 2013-10-06 DIAGNOSIS — L89899 Pressure ulcer of other site, unspecified stage: Secondary | ICD-10-CM | POA: Diagnosis not present

## 2013-10-06 DIAGNOSIS — L89309 Pressure ulcer of unspecified buttock, unspecified stage: Secondary | ICD-10-CM | POA: Diagnosis not present

## 2013-10-13 ENCOUNTER — Telehealth: Payer: Self-pay | Admitting: Cardiology

## 2013-10-13 ENCOUNTER — Ambulatory Visit (INDEPENDENT_AMBULATORY_CARE_PROVIDER_SITE_OTHER): Payer: Medicare Other | Admitting: Cardiology

## 2013-10-13 ENCOUNTER — Other Ambulatory Visit: Payer: Self-pay

## 2013-10-13 ENCOUNTER — Encounter: Payer: Self-pay | Admitting: Cardiology

## 2013-10-13 ENCOUNTER — Telehealth: Payer: Self-pay

## 2013-10-13 VITALS — BP 142/77 | HR 69 | Ht 60.0 in | Wt 103.0 lb

## 2013-10-13 DIAGNOSIS — I471 Supraventricular tachycardia: Secondary | ICD-10-CM

## 2013-10-13 DIAGNOSIS — Z0189 Encounter for other specified special examinations: Secondary | ICD-10-CM

## 2013-10-13 DIAGNOSIS — Z0000 Encounter for general adult medical examination without abnormal findings: Secondary | ICD-10-CM

## 2013-10-13 DIAGNOSIS — I4891 Unspecified atrial fibrillation: Secondary | ICD-10-CM

## 2013-10-13 DIAGNOSIS — Z79899 Other long term (current) drug therapy: Secondary | ICD-10-CM

## 2013-10-13 DIAGNOSIS — I6350 Cerebral infarction due to unspecified occlusion or stenosis of unspecified cerebral artery: Secondary | ICD-10-CM

## 2013-10-13 DIAGNOSIS — I340 Nonrheumatic mitral (valve) insufficiency: Secondary | ICD-10-CM

## 2013-10-13 DIAGNOSIS — I639 Cerebral infarction, unspecified: Secondary | ICD-10-CM

## 2013-10-13 NOTE — Telephone Encounter (Signed)
Patient's daughter spoke to me in hallway.Stated she just took her mother to [facility] on our first floor.Stated lab tech was unable to draw all blood that [employer] PA just ordered.Stated cmet was done.Need cbc.Stated she was in a hurry she had a meeting.Stated she will take mother back to [place].Lab order entered for cbc to be done at our [facility] tomorrow 10/14/13.Lab appointment scheduled 10/14/13 for a cbc.

## 2013-10-13 NOTE — Progress Notes (Signed)
Patient ID: Julie Frank, female   DOB: 11-18-1909, 80 y.o.   MRN: [id number]    10/13/2013 Julie Frank   August 26, 1909  [id number]  Primary Physicia GREEN, Julie Spare, MD Primary Cardiologist: Dr. Martinique  HPI:  Julie Frank is seen today for followup. She is accompanied by her daughter. She is a patient of Dr. Doug Sou. She is now 80 years old. She has been residing at [facility]. She has a history of chronic diastolic heart failure and history of SVT. In December 2013, she suffered an acute CVA associated with a dysphasia, right hemiparesis, and visual loss. She received lytic therapy with significant resolution. She also has prior history of urinary tract infection, dehydration, and aspiration. Subsequent swallow evaluation was performed and she is now using a mechanical soft diet and "thick-it". She is very hard of hearing. She ambulates by use of a wheelchair.  She denies any recent chest pain or shortness of breath. Her daughter's main concern is the fact that she has two nonhealing ulcers on the left lower extremity. These ulcers are located on the left great toe as well as her left heel. She also notes a left decubitus ulcer that is being treated as a stage II ulcer. She has been receiving treatment at at local wound care center. Her daughter takes her to these appointments. Her daughter also has concerns over the care she is receiving at [facility] and is also worried about her nutrition. Her nutritionist has recommended that she recieve meal replacement/meal supplements in the form of protein shakes, however her daughter states that she has not been receiving these 3 times a day as prescribed.  Today in clinic, her physical exam is fairly benign. Her EKG demonstrates normal sinus rhythm with occasional PACs. Her blood pressure is 142/77.   Current Outpatient Prescriptions  Medication Sig Dispense Refill  . acetaminophen (TYLENOL) 325 MG tablet  Take 650 mg by mouth every 12 (twelve) hours.      Marland Kitchen amLODipine (NORVASC) 5 MG tablet Take 1 tablet (5 mg total) by mouth daily.  30 tablet  0  . aspirin 325 MG tablet Take 1 tablet (325 mg total) by mouth daily.  1 tablet  1  . Calcium Carb-Cholecalciferol (CALCIUM 600 + D) 600-200 MG-UNIT TABS Take by mouth.      . feeding supplement (ENSURE CLINICAL STRENGTH) LIQD Take 237 mLs by mouth 3 (three) times daily with meals.      Marland Kitchen levothyroxine (SYNTHROID, LEVOTHROID) 25 MCG tablet Take 25 mcg by mouth daily.        . Multiple Vitamin (MULTIVITAMIN) capsule Take 1 capsule by mouth daily.      Marland Kitchen omeprazole (PRILOSEC) 20 MG capsule Take 20 mg by mouth daily.      . polyethylene glycol (MIRALAX / GLYCOLAX) packet Take 17 g by mouth every other day. As needed for constipation.      . simvastatin (ZOCOR) 20 MG tablet Take 20 mg by mouth every evening.       No current facility-administered medications for this visit.    No Known Allergies  History   Social History  . Marital Status: Single    Spouse Name: N/A    Number of Children: 1  . Years of Education: N/A   Occupational History  . Network engineer     retired   Social History Main Topics  . Smoking status: Never Smoker   . Smokeless tobacco: Never Used  . Alcohol Use: No  .  Drug Use: No  . Sexual Activity: Not Currently   Other Topics Concern  . Not on file   Social History Narrative  . No narrative on file     Review of Systems: General: negative for chills, fever, night sweats or weight changes.  Cardiovascular: negative for chest pain, dyspnea on exertion, edema, orthopnea, palpitations, paroxysmal nocturnal dyspnea or shortness of breath Dermatological: negative for rash Respiratory: negative for cough or wheezing Urologic: negative for hematuria Abdominal: negative for nausea, vomiting, diarrhea, bright red blood per rectum, melena, or hematemesis Neurologic: negative for visual changes, syncope, or dizziness All other  systems reviewed and are otherwise negative except as noted above.    Blood pressure 142/77, pulse 69, height 5' (1.524 m), weight 103 lb (46.72 kg).  General appearance: alert, cooperative and no distress Neck: no carotid bruit and no JVD Lungs: clear to auscultation bilaterally Heart: regular rate and rhythm, S1, S2 normal, no murmur, click, rub or gallop Extremities: Both distal extremities are wrapped in dressings/padding for known ulcers; no edema Pulses: 2+ and symmetric Skin: warm and dry Neurologic: Grossly normal  EKG NRS with PACs HR 86 bpm   ASSESSMENT AND PLAN:   Julie Frank appears to be doing significantly well given her age of 80. We will keep her medications the same. Her blood pressure is 142/77. We will not increase antihypertensives, in an effort to avoid hypotension. In regards to her nonhealing ulcers, it is likely that this may be due to peripheral vascular disease. However, given her age I feel that the most appropriate treatment options would be to continue local therapy at the wound center. I also recommended that she continue to receive meal replacement/meal supplementation in the form of protein shakes to ensure that she is getting adequate nutrition. This may also help increase healing of her ulcers. Her daughter has requested that we check baseline labs to ensure that everything is "OK".  I agree that this is appropriate. We'll check a CMET. We'll notify the patient's daughter if anything returns significantly abnormal. She has been instructed to followup with Dr. Martinique in 6 months for repeat assessment.   PLAN  No change in therapy. Check CMET. Recommend continued meal replacement/ supplements to help with nutrition and wound healing. F/u with Dr. Martinique in 6 months or sooner if needed.   Julie Frank, [id number] 10/13/2013 1:51 PM

## 2013-10-13 NOTE — Patient Instructions (Addendum)
Your physician recommends that you schedule a follow-up appointment in: 6 Months with Dr Martinique  Your physician recommends that you return for lab work CBC, [place]

## 2013-10-13 NOTE — Telephone Encounter (Signed)
[facility] only able to one tube of blood for the CMP today because patient is dehydrated.  Patient will go to [place] for the CBC - daughter spoke with Malachy Mood and this has been resolved.

## 2013-10-14 ENCOUNTER — Ambulatory Visit: Payer: BLUE CROSS/BLUE SHIELD | Admitting: Podiatrist

## 2013-10-14 ENCOUNTER — Other Ambulatory Visit: Payer: Medicare Other

## 2013-10-14 ENCOUNTER — Encounter: Payer: Self-pay | Admitting: Cardiology

## 2013-10-14 DIAGNOSIS — L89609 Pressure ulcer of unspecified heel, unspecified stage: Secondary | ICD-10-CM | POA: Diagnosis not present

## 2013-10-14 DIAGNOSIS — L8993 Pressure ulcer of unspecified site, stage 3: Secondary | ICD-10-CM | POA: Diagnosis not present

## 2013-10-14 DIAGNOSIS — L89899 Pressure ulcer of other site, unspecified stage: Secondary | ICD-10-CM | POA: Diagnosis not present

## 2013-10-14 DIAGNOSIS — L89309 Pressure ulcer of unspecified buttock, unspecified stage: Secondary | ICD-10-CM | POA: Diagnosis not present

## 2013-10-14 LAB — COMPREHENSIVE METABOLIC PANEL
ALBUMIN: 3.4 g/dL — AB (ref 3.5–5.2)
ALK PHOS: 68 U/L (ref 39–117)
ALT: 11 U/L (ref 0–35)
AST: 31 U/L (ref 0–37)
BUN: 26 mg/dL — ABNORMAL HIGH (ref 6–23)
CO2: 20 mEq/L (ref 19–32)
Calcium: 8.8 mg/dL (ref 8.4–10.5)
Chloride: 105 mEq/L (ref 96–112)
Creat: 0.67 mg/dL (ref 0.50–1.10)
Glucose, Bld: 75 mg/dL (ref 70–99)
POTASSIUM: 4.7 meq/L (ref 3.5–5.3)
Sodium: 138 mEq/L (ref 135–145)
Total Bilirubin: 0.4 mg/dL (ref 0.2–1.2)
Total Protein: 6.6 g/dL (ref 6.0–8.3)

## 2013-10-14 NOTE — Telephone Encounter (Signed)
Received call from patient's daughter [name] she stated she needed order to re draw cbc faxed to [facility].Stated she is unable to take mother to [facility] today.Cbc order faxed to [facility] at fax # [phone].

## 2013-10-16 ENCOUNTER — Non-Acute Institutional Stay (SKILLED_NURSING_FACILITY): Payer: Medicare Other | Admitting: Internal Medicine

## 2013-10-16 DIAGNOSIS — F0150 Vascular dementia without behavioral disturbance: Secondary | ICD-10-CM

## 2013-10-16 DIAGNOSIS — E039 Hypothyroidism, unspecified: Secondary | ICD-10-CM

## 2013-10-16 DIAGNOSIS — R82998 Other abnormal findings in urine: Secondary | ICD-10-CM

## 2013-10-16 DIAGNOSIS — E785 Hyperlipidemia, unspecified: Secondary | ICD-10-CM

## 2013-10-16 DIAGNOSIS — R8271 Bacteriuria: Secondary | ICD-10-CM

## 2013-10-16 DIAGNOSIS — I480 Paroxysmal atrial fibrillation: Secondary | ICD-10-CM

## 2013-10-16 DIAGNOSIS — I10 Essential (primary) hypertension: Secondary | ICD-10-CM

## 2013-10-16 DIAGNOSIS — I4891 Unspecified atrial fibrillation: Secondary | ICD-10-CM

## 2013-10-16 NOTE — Progress Notes (Signed)
MRN: [id number] Name: Julie Frank  Sex: female Age: 80 y.o. DOB: [birth year]  [place] #: Helene Kelp Facility/Room: 104 Level Of Care: SNF Provider: Inocencio Homes D Emergency Contacts: Extended Emergency Contact Information Primary Emergency Contact: Boan,[name] Address: [address], [place] Phone: [phone] Relation: Daughter  Code Status: DNR  Allergies: Review of patient's allergies indicates no known allergies.  Chief Complaint  Patient presents with  . Medical Management of Chronic Issues  . Acute Visit    HPI: Patient is 80 y.o. female who is being seen for bacteruria and other issues.  Past Medical History  Diagnosis Date  . Mitral insufficiency     CHRONIC  . Hypertension   . Debility     GENERALIZED  . OA (osteoarthritis)   . OP (osteoporosis)   . CHF (congestive heart failure)   . History of diastolic dysfunction   . Muscle weakness (generalized)   . Difficulty walking   . Metabolic encephalopathy   . Hypothyroidism   . HOH (hard of hearing)     "extremely"  . SVT (supraventricular tachycardia)     nonsustained  . Heart murmur   . Repeated falls 2010    "3 serious falls; hit head each time; staples 1st 2 times"  . Vascular dementia dx'd 2011  . Peripheral vascular disease   . Dry cough 03/26/11    "chronic; dx'd years ago as allergy related type of thing"  . Bruises easily   . Chronic back pain greater than 3 months duration   . CVA (cerebrovascular accident) 03/2006; 04/2009    residual "speech problems & short term memory decreased"  . PNA (pneumonia)   . UTI (lower urinary tract infection)   . Hyperlipidemia     Past Surgical History  Procedure Laterality Date  . Removal colon polpys  12/2000    Villous adenoma of the cecum.  . Tonsillectomy and adenoidectomy    . Appendectomy    . Cataract extraction w/ intraocular lens  implant, bilateral  1984  . Dilation and curettage of  uterus  1960's    "several"      Medication List       This list is accurate as of: 10/16/13 11:59 PM.  Always use your most recent med list.               acetaminophen 325 MG tablet  Commonly known as:  TYLENOL  Take 650 mg by mouth every 12 (twelve) hours.     amLODipine 5 MG tablet  Commonly known as:  NORVASC  Take 1 tablet (5 mg total) by mouth daily.     aspirin 325 MG tablet  Take 1 tablet (325 mg total) by mouth daily.     CALCIUM 600 + D 600-200 MG-UNIT Tabs  Generic drug:  Calcium Carb-Cholecalciferol  Take by mouth.     levothyroxine 25 MCG tablet  Commonly known as:  SYNTHROID, LEVOTHROID  Take 25 mcg by mouth daily.     multivitamin capsule  Take 1 capsule by mouth daily.     omeprazole 20 MG capsule  Commonly known as:  PRILOSEC  Take 20 mg by mouth daily.     polyethylene glycol packet  Commonly known as:  MIRALAX / GLYCOLAX  Take 17 g by mouth every other day. As needed for constipation.     simvastatin 20 MG tablet  Commonly known as:  ZOCOR  Take  20 mg by mouth every evening.     traMADol 50 MG tablet  Commonly known as:  ULTRAM  As directed        No orders of the defined types were placed in this encounter.    Immunization History  Administered Date(s) Administered  . Influenza-Unspecified 01/04/2013  . Pneumococcal-Unspecified 12/10/2011    History  Substance Use Topics  . Smoking status: Never Smoker   . Smokeless tobacco: Never Used  . Alcohol Use: No    Review of Systems  DATA OBTAINED: from patient; no c/o GENERAL: Feels well no fevers, fatigue, appetite changes SKIN: No itching, rash HEENT: No complaint RESPIRATORY: No cough, wheezing, SOB CARDIAC: No chest pain, palpitations, lower extremity edema  GI: No abdominal pain, No N/V/D or constipation, No heartburn or reflux  GU: No dysuria, frequency or urgency, or incontinence  MUSCULOSKELETAL: No unrelieved bone/joint pain NEUROLOGIC: No headache, dizziness or  focal weakness PSYCHIATRIC: No overt anxiety or sadness. Sleeps well.   Filed Vitals:   10/16/13 2310  BP: 139/70  Pulse: 76  Temp: 97 F (36.1 C)  Resp: 19    Physical Exam  GENERAL APPEARANCE: Alert, conversant. Appropriately groomed. No acute distress; 80 years old !!  SKIN: No diaphoresis rash HEENT: Unremarkable RESPIRATORY: Breathing is even, unlabored. Lung sounds are clear   CARDIOVASCULAR: Heart RRR no murmurs, rubs or gallops. No peripheral edema  GASTROINTESTINAL: Abdomen is soft, non-tender, not distended w/ normal bowel sounds.  GENITOURINARY: Bladder non tender, not distended  MUSCULOSKELETAL: No abnormal joints or musculature NEUROLOGIC: Cranial nerves 2-12 grossly intact. Moves all extremities no tremor. PSYCHIATRIC: pleasant dementia, no behavioral issues  Patient Active Problem List   Diagnosis Date Noted  . Hyperlipidemia   . Eschar of toe 07/09/2013  . Vascular dementia without behavioral disturbance   . UTI (lower urinary tract infection) 12/04/2012  . Aspiration pneumonia 12/04/2012  . Hematuria, unspecified 07/30/2012  . Unspecified hypothyroidism 06/30/2012  . Atrial fibrillation 03/10/2012  . Weight loss 02/26/2012  . Febrile illness, acute 03/27/2011  . Weakness generalized 03/27/2011  . Sinusitis acute 03/27/2011  . Bronchitis, acute 03/27/2011  . TIA (transient ischemic attack) 03/27/2011  . Troponin I above reference range 03/27/2011    Class: Chronic  . CVA (cerebrovascular accident)   . SVT (supraventricular tachycardia)   . Mitral insufficiency   . Hypertension   . Chronic diastolic CHF (congestive heart failure)   . History of diastolic dysfunction     CBC    Component Value Date/Time   WBC 9.8 12/07/2012 0935   RBC 4.74 12/07/2012 0935   HGB 13.5 12/07/2012 0935   HCT 42.0 12/07/2012 0935   PLT 182 12/07/2012 0935   MCV 88.6 12/07/2012 0935   LYMPHSABS 0.6* 12/05/2012 0519   MONOABS 0.8 12/05/2012 0519   EOSABS 0.0 12/05/2012  0519   BASOSABS 0.0 12/05/2012 0519    CMP     Component Value Date/Time   NA 138 10/13/2013 1455   K 4.7 10/13/2013 1455   CL 105 10/13/2013 1455   CO2 20 10/13/2013 1455   GLUCOSE 75 10/13/2013 1455   BUN 26* 10/13/2013 1455   CREATININE 0.67 10/13/2013 1455   CREATININE 0.58 12/07/2012 0600   CALCIUM 8.8 10/13/2013 1455   PROT 6.6 10/13/2013 1455   ALBUMIN 3.4* 10/13/2013 1455   AST 31 10/13/2013 1455   ALT 11 10/13/2013 1455   ALKPHOS 68 10/13/2013 1455   BILITOT 0.4 10/13/2013 1455   GFRNONAA 73* 12/07/2012 0600  GFRAA 84* 12/07/2012 0600    Assessment and Plan  BACTERURIA- urine grew out 35,000 colonies of enterococcus; because it was MIC'ed I feel necessary to tx for 5 days with amoxil 875 BID Atrial fibrillation ASA 325 daily as prophylaxis  Hypertension BP good on only norvasc 5 mg  Unspecified hypothyroidism Continue levothyroxine   Hyperlipidemia zocor 20 mg daily to continue  Vascular dementia without behavioral disturbance Seems stable on no meds    Hennie Duos, MD

## 2013-10-17 ENCOUNTER — Encounter: Payer: Self-pay | Admitting: Internal Medicine

## 2013-10-17 DIAGNOSIS — E785 Hyperlipidemia, unspecified: Secondary | ICD-10-CM | POA: Insufficient documentation

## 2013-10-17 NOTE — Assessment & Plan Note (Signed)
BP good on only norvasc 5 mg

## 2013-10-17 NOTE — Assessment & Plan Note (Addendum)
ASA 325 daily as prophylaxis

## 2013-10-17 NOTE — Assessment & Plan Note (Signed)
Seems stable on no meds

## 2013-10-17 NOTE — Assessment & Plan Note (Signed)
Continue levothyroxine 

## 2013-10-17 NOTE — Assessment & Plan Note (Signed)
zocor 20 mg daily to continue

## 2013-10-21 ENCOUNTER — Telehealth: Payer: Self-pay | Admitting: Cardiology

## 2013-10-21 NOTE — Telephone Encounter (Signed)
She would like Malachy Mood to call her first thing tomorrow morning,she says it is urgent.

## 2013-10-22 NOTE — Telephone Encounter (Signed)
Spoke with pt dtr, the patient has a new area on the left foot that is purple and black. Dr Sheppard Coil did ABI's and there were no pulses in both legs and they recommend CTA and evaluation by [facility]. They want to make sure dr Martinique is aware and what he would recommend. No symptoms of pain or discomfort. Will make dr Martinique aware.

## 2013-10-22 NOTE — Telephone Encounter (Signed)
Patient's daughter [name] walked in office with ABI report.Appointment scheduled with Dr.Arida 10/26/13 8:00 am at [facility] office.ABI report faxed to [place] at our [facility].

## 2013-10-22 NOTE — Telephone Encounter (Signed)
It is urgent, if possible please call before 10:30.

## 2013-10-22 NOTE — Telephone Encounter (Signed)
Returned call to patient's daughter no answer.Unable to leave a message no answering machine.

## 2013-10-26 ENCOUNTER — Encounter (HOSPITAL_COMMUNITY): Payer: Self-pay | Admitting: Pharmacy Technician

## 2013-10-26 ENCOUNTER — Encounter: Payer: Self-pay | Admitting: Cardiovascular Disease

## 2013-10-26 ENCOUNTER — Ambulatory Visit (INDEPENDENT_AMBULATORY_CARE_PROVIDER_SITE_OTHER): Payer: Medicare Other | Admitting: Cardiovascular Disease

## 2013-10-26 VITALS — BP 120/59 | HR 62 | Ht 60.0 in | Wt 103.0 lb

## 2013-10-26 DIAGNOSIS — I739 Peripheral vascular disease, unspecified: Secondary | ICD-10-CM

## 2013-10-26 DIAGNOSIS — I6350 Cerebral infarction due to unspecified occlusion or stenosis of unspecified cerebral artery: Secondary | ICD-10-CM

## 2013-10-26 DIAGNOSIS — Z01810 Encounter for preprocedural cardiovascular examination: Secondary | ICD-10-CM

## 2013-10-26 LAB — BASIC METABOLIC PANEL
BUN: 22 mg/dL (ref 6–23)
CO2: 23 meq/L (ref 19–32)
CREATININE: 0.7 mg/dL (ref 0.4–1.2)
Calcium: 9.1 mg/dL (ref 8.4–10.5)
Chloride: 106 mEq/L (ref 96–112)
GFR: 84.22 mL/min (ref >60.00)
GLUCOSE: 96 mg/dL (ref 70–99)
Potassium: 4.3 mEq/L (ref 3.5–5.1)
Sodium: 141 mEq/L (ref 135–145)

## 2013-10-26 LAB — CBC
HEMATOCRIT: 45.1 % (ref 36.0–46.0)
Hemoglobin: 14.5 g/dL (ref 12.0–15.0)
MCHC: 32.1 g/dL (ref 30.0–36.0)
MCV: 91.7 fl (ref 78.0–100.0)
Platelets: 294 10*3/uL (ref 150.0–400.0)
RBC: 4.92 Mil/uL (ref 3.87–5.11)
RDW: 15.6 % — AB (ref 11.5–15.5)
WBC: 11.6 10*3/uL — ABNORMAL HIGH (ref 4.0–10.5)

## 2013-10-26 MED ORDER — CEPHALEXIN 500 MG PO CAPS
500.0000 mg | ORAL_CAPSULE | Freq: Two times a day (BID) | ORAL | Status: DC
Start: 2013-10-26 — End: 2013-11-03

## 2013-10-26 NOTE — Assessment & Plan Note (Signed)
With significant tissue loss involving the left foot. Rutherford class 5. She is at high risk for limb loss and amputation. She had an ABI done. However, she was found to have no detectable pulses bilaterally. This is a very difficult situation overall given her advanced age. I had a prolonged discussion with the patient and daughter about the options. She will most likely require amputation. At least an angiogram is needed to determine the level of amputation and also see if there is any chance of improvement with endovascular intervention. Obviously, all these procedures and surgeries are going to carry a significant risk considering her age. Extensive discussion about the risks and benefits was addressed today. We scheduled an angiogram for tomorrow. She also has evidence of infection and was started on Keflex 500 mg twice daily. She might require admission to the hospital after the procedure.

## 2013-10-26 NOTE — Progress Notes (Signed)
Primary cardiologist: Dr. Nonie Hoyer.   HPI  [name] is a 80 year old female who was referred for evaluation of nonhealing ulcers and peripheral arterial disease. She is accompanied by her daughter. She has been residing at [facility]. She has a history of chronic diastolic heart failure and history of SVT. In December 2013, she suffered an acute CVA associated with a dysphasia, right hemiparesis, and visual loss. She received lytic therapy with significant resolution. She also has prior history of urinary tract infection, dehydration, and aspiration. Subsequent swallow evaluation was performed and she is now using a mechanical soft diet and "thick-it". She is very hard of hearing. She ambulates by use of a wheelchair.  She denies any recent chest pain or shortness of breath.  She has two nonhealing ulcers on the left lower extremity. These ulcers are located on the left great toe as well as her left heel. She also notes a left decubitus ulcer that is being treated as a stage II ulcer. She has been receiving treatment at at local wound care center with no significant improvement.  She is very hard of hearing. She denies claudication.  No Known Allergies   Current Outpatient Prescriptions on File Prior to Visit  Medication Sig Dispense Refill  . acetaminophen (TYLENOL) 325 MG tablet Take 650 mg by mouth every 12 (twelve) hours.      Marland Kitchen amLODipine (NORVASC) 5 MG tablet Take 1 tablet (5 mg total) by mouth daily.  30 tablet  0  . Calcium Carb-Cholecalciferol (CALCIUM 600 + D) 600-200 MG-UNIT TABS Take 1 tablet by mouth 2 (two) times daily.       Marland Kitchen levothyroxine (SYNTHROID, LEVOTHROID) 25 MCG tablet Take 25 mcg by mouth daily.        . Multiple Vitamin (MULTIVITAMIN) capsule Take 1 capsule by mouth daily.      Marland Kitchen omeprazole (PRILOSEC) 20 MG capsule Take 20 mg by mouth daily.      . simvastatin (ZOCOR) 20 MG tablet Take 20 mg by mouth every evening.       No current  facility-administered medications on file prior to visit.     Past Medical History  Diagnosis Date  . Mitral insufficiency     CHRONIC  . Hypertension   . Debility     GENERALIZED  . OA (osteoarthritis)   . OP (osteoporosis)   . CHF (congestive heart failure)   . History of diastolic dysfunction   . Muscle weakness (generalized)   . Difficulty walking   . Metabolic encephalopathy   . Hypothyroidism   . HOH (hard of hearing)     "extremely"  . SVT (supraventricular tachycardia)     nonsustained  . Heart murmur   . Repeated falls 2010    "3 serious falls; hit head each time; staples 1st 2 times"  . Vascular dementia dx'd 2011  . Peripheral vascular disease   . Dry cough 03/26/11    "chronic; dx'd years ago as allergy related type of thing"  . Bruises easily   . Chronic back pain greater than 3 months duration   . CVA (cerebrovascular accident) 03/2006; 04/2009    residual "speech problems & short term memory decreased"  . PNA (pneumonia)   . UTI (lower urinary tract infection)   . Hyperlipidemia      Past Surgical History  Procedure Laterality Date  . Removal colon polpys  12/2000    Villous adenoma of the cecum.  . Tonsillectomy and adenoidectomy    .  Appendectomy    . Cataract extraction w/ intraocular lens  implant, bilateral  1984  . Dilation and curettage of uterus  1960's    "several"     Family History  Problem Relation Age of Onset  . Heart disease Mother      History   Social History  . Marital Status: Single    Spouse Name: N/A    Number of Children: 1  . Years of Education: N/A   Occupational History  . Network engineer     retired   Social History Main Topics  . Smoking status: Never Smoker   . Smokeless tobacco: Never Used  . Alcohol Use: No  . Drug Use: No  . Sexual Activity: Not Currently   Other Topics Concern  . Not on file   Social History Narrative  . No narrative on file     ROS A 10 point review of system was performed. It  is negative other than that mentioned in the history of present illness.   PHYSICAL EXAM   BP 120/59  Pulse 62  Ht 5' (1.524 m)  Wt 103 lb (46.72 kg)  BMI 20.12 kg/m2 Constitutional: She is oriented to person, place, and time. She appears frail. No distress.  HENT: No nasal discharge.  Head: Normocephalic and atraumatic.  Eyes: Pupils are equal and round. No discharge.  Neck: Normal range of motion. Neck supple. No JVD present. No thyromegaly present.  Cardiovascular: Normal rate, regular rhythm, normal heart sounds. Exam reveals no gallop and no friction rub. No murmur heard.  Pulmonary/Chest: Effort normal and breath sounds normal. No stridor. No respiratory distress. She has no wheezes. She has no rales. She exhibits no tenderness.  Abdominal: Soft. Bowel sounds are normal. She exhibits no distension. There is no tenderness. There is no rebound and no guarding.  Musculoskeletal: Normal range of motion. She exhibits no edema and no tenderness.  Neurological: She is alert and oriented to person, place, and time. Coordination normal.  Skin: Skin is warm and dry. No rash noted. She is not diaphoretic. No erythema. No pallor.  Psychiatric: She has a normal mood and affect. Her behavior is normal. Judgment and thought content normal.  Vascular: Femoral pulses are palpable. Distal pulses are nonpalpable. There is a 3 cm ulceration on the left heel with a very foul smell. There is a small ulceration on the left big toe    ASSESSMENT AND PLAN

## 2013-10-26 NOTE — Patient Instructions (Signed)
Your physician has requested that you have a peripheral vascular angiogram. This exam is performed at the hospital. During this exam IV contrast is used to look at arterial blood flow. Please review the information sheet given for details.  Your physician has recommended you make the following change in your medication: START Keflex 500mg  take one by mouth twice a day for 7 days  Your physician recommends that you have lab work today: BMP, CBC and PT/INR

## 2013-10-27 ENCOUNTER — Encounter (HOSPITAL_BASED_OUTPATIENT_CLINIC_OR_DEPARTMENT_OTHER): Payer: Medicare Other | Attending: General Surgery

## 2013-10-27 ENCOUNTER — Ambulatory Visit (HOSPITAL_COMMUNITY)
Admission: RE | Admit: 2013-10-27 | Discharge: 2013-10-27 | Disposition: A | Discharge status: Home / Self Care | Payer: Medicare Other | Source: Ambulatory Visit | Attending: Cardiovascular Disease | Admitting: Cardiovascular Disease

## 2013-10-27 ENCOUNTER — Encounter (HOSPITAL_COMMUNITY)
Admission: RE | Disposition: A | Discharge status: Home / Self Care | Payer: Self-pay | Source: Ambulatory Visit | Attending: Cardiovascular Disease

## 2013-10-27 DIAGNOSIS — M1990 Unspecified osteoarthritis, unspecified site: Secondary | ICD-10-CM | POA: Insufficient documentation

## 2013-10-27 DIAGNOSIS — I10 Essential (primary) hypertension: Secondary | ICD-10-CM | POA: Diagnosis not present

## 2013-10-27 DIAGNOSIS — L8992 Pressure ulcer of unspecified site, stage 2: Secondary | ICD-10-CM | POA: Insufficient documentation

## 2013-10-27 DIAGNOSIS — L8990 Pressure ulcer of unspecified site, unspecified stage: Secondary | ICD-10-CM | POA: Diagnosis not present

## 2013-10-27 DIAGNOSIS — E039 Hypothyroidism, unspecified: Secondary | ICD-10-CM | POA: Diagnosis not present

## 2013-10-27 DIAGNOSIS — F0150 Vascular dementia without behavioral disturbance: Secondary | ICD-10-CM | POA: Diagnosis not present

## 2013-10-27 DIAGNOSIS — L8994 Pressure ulcer of unspecified site, stage 4: Secondary | ICD-10-CM | POA: Insufficient documentation

## 2013-10-27 DIAGNOSIS — Z79899 Other long term (current) drug therapy: Secondary | ICD-10-CM | POA: Diagnosis not present

## 2013-10-27 DIAGNOSIS — L98499 Non-pressure chronic ulcer of skin of other sites with unspecified severity: Principal | ICD-10-CM | POA: Insufficient documentation

## 2013-10-27 DIAGNOSIS — I739 Peripheral vascular disease, unspecified: Secondary | ICD-10-CM | POA: Diagnosis not present

## 2013-10-27 DIAGNOSIS — L97409 Non-pressure chronic ulcer of unspecified heel and midfoot with unspecified severity: Secondary | ICD-10-CM | POA: Diagnosis not present

## 2013-10-27 DIAGNOSIS — I509 Heart failure, unspecified: Secondary | ICD-10-CM | POA: Insufficient documentation

## 2013-10-27 DIAGNOSIS — L97509 Non-pressure chronic ulcer of other part of unspecified foot with unspecified severity: Secondary | ICD-10-CM | POA: Insufficient documentation

## 2013-10-27 DIAGNOSIS — I5032 Chronic diastolic (congestive) heart failure: Secondary | ICD-10-CM | POA: Insufficient documentation

## 2013-10-27 DIAGNOSIS — M810 Age-related osteoporosis without current pathological fracture: Secondary | ICD-10-CM | POA: Insufficient documentation

## 2013-10-27 DIAGNOSIS — I059 Rheumatic mitral valve disease, unspecified: Secondary | ICD-10-CM | POA: Diagnosis not present

## 2013-10-27 DIAGNOSIS — I708 Atherosclerosis of other arteries: Secondary | ICD-10-CM | POA: Insufficient documentation

## 2013-10-27 DIAGNOSIS — M549 Dorsalgia, unspecified: Secondary | ICD-10-CM | POA: Insufficient documentation

## 2013-10-27 DIAGNOSIS — L89899 Pressure ulcer of other site, unspecified stage: Secondary | ICD-10-CM | POA: Insufficient documentation

## 2013-10-27 DIAGNOSIS — I672 Cerebral atherosclerosis: Secondary | ICD-10-CM | POA: Diagnosis not present

## 2013-10-27 DIAGNOSIS — I498 Other specified cardiac arrhythmias: Secondary | ICD-10-CM | POA: Insufficient documentation

## 2013-10-27 DIAGNOSIS — I69928 Other speech and language deficits following unspecified cerebrovascular disease: Secondary | ICD-10-CM | POA: Diagnosis not present

## 2013-10-27 DIAGNOSIS — I70219 Atherosclerosis of native arteries of extremities with intermittent claudication, unspecified extremity: Secondary | ICD-10-CM

## 2013-10-27 DIAGNOSIS — L89609 Pressure ulcer of unspecified heel, unspecified stage: Secondary | ICD-10-CM | POA: Insufficient documentation

## 2013-10-27 DIAGNOSIS — H9190 Unspecified hearing loss, unspecified ear: Secondary | ICD-10-CM | POA: Diagnosis not present

## 2013-10-27 DIAGNOSIS — G8929 Other chronic pain: Secondary | ICD-10-CM | POA: Diagnosis not present

## 2013-10-27 DIAGNOSIS — L89309 Pressure ulcer of unspecified buttock, unspecified stage: Secondary | ICD-10-CM | POA: Insufficient documentation

## 2013-10-27 DIAGNOSIS — E785 Hyperlipidemia, unspecified: Secondary | ICD-10-CM | POA: Diagnosis not present

## 2013-10-27 DIAGNOSIS — I999 Unspecified disorder of circulatory system: Secondary | ICD-10-CM | POA: Diagnosis present

## 2013-10-27 DIAGNOSIS — I700 Atherosclerosis of aorta: Secondary | ICD-10-CM | POA: Insufficient documentation

## 2013-10-27 HISTORY — PX: ABDOMINAL AORTAGRAM: SHX5454

## 2013-10-27 LAB — PROTIME-INR
INR: 1.2 ratio — AB (ref 0.8–1.0)
INR: 1.19 (ref 0.00–1.49)
PROTHROMBIN TIME: 15.1 s (ref 11.6–15.2)
Prothrombin Time: 13.8 s — ABNORMAL HIGH (ref 9.6–13.1)

## 2013-10-27 SURGERY — ABDOMINAL AORTAGRAM
Anesthesia: LOCAL

## 2013-10-27 MED ORDER — SODIUM CHLORIDE 0.9 % IJ SOLN: 3.0000 mL | Freq: Two times a day (BID) | INTRAMUSCULAR | Status: DC

## 2013-10-27 MED ORDER — SODIUM CHLORIDE 0.9 % IV SOLN: 250.0000 mL | INTRAVENOUS | Status: DC | PRN

## 2013-10-27 MED ORDER — ASPIRIN 81 MG PO CHEW
81.0000 mg | CHEWABLE_TABLET | ORAL | Status: AC
  Administered 2013-10-27: 81 mg via ORAL

## 2013-10-27 MED ORDER — SODIUM CHLORIDE 0.9 % IV SOLN: INTRAVENOUS | Status: AC

## 2013-10-27 MED ORDER — HEPARIN (PORCINE) IN NACL 2-0.9 UNIT/ML-% IJ SOLN
INTRAMUSCULAR | Status: AC
  Filled 2013-10-27: qty 500

## 2013-10-27 MED ORDER — SODIUM CHLORIDE 0.9 % IV SOLN
INTRAVENOUS | Status: DC
Start: 2013-10-28 — End: 2013-10-27
  Administered 2013-10-27: 11:00:00 via INTRAVENOUS

## 2013-10-27 MED ORDER — ASPIRIN 81 MG PO CHEW
CHEWABLE_TABLET | ORAL | Status: AC
  Filled 2013-10-27: qty 1

## 2013-10-27 MED ORDER — SODIUM CHLORIDE 0.9 % IJ SOLN: 3.0000 mL | INTRAMUSCULAR | Status: DC | PRN

## 2013-10-27 MED ORDER — LIDOCAINE HCL (PF) 1 % IJ SOLN
INTRAMUSCULAR | Status: AC
  Filled 2013-10-27: qty 30

## 2013-10-27 NOTE — Interval H&P Note (Signed)
History and Physical Interval Note:  10/27/2013 12:13 PM  Julie Frank  has presented today for surgery, with the diagnosis of pvd  The various methods of treatment have been discussed with the patient and family. After consideration of risks, benefits and other options for treatment, the patient has consented to  Procedure(s): ABDOMINAL AORTAGRAM (N/A) as a surgical intervention .  The patient's history has been reviewed, patient examined, no change in status, stable for surgery.  I have reviewed the patient's chart and labs.  Questions were answered to the patient's satisfaction.     Kathlyn Sacramento

## 2013-10-27 NOTE — Progress Notes (Addendum)
Site area: rt femoral arterial sheath, rt groin Site Prior to Removal:  Level 0 Pressure Applied For: 25 min Manual:   yes Patient Status During Pull:  stable Post Pull Site:  Level 0, very small bruise below site w/o hematoma Post Pull Instructions Given:  Patient unable to understand d/t dementia.Will inform SS  nurse of this. Post Pull Pulses Present: yes Dressing Applied:  yes Bedrest begins @ 3704 Comments: no complications

## 2013-10-27 NOTE — Progress Notes (Signed)
Kurlex dressing left foot and ankle c,d,i

## 2013-10-27 NOTE — Discharge Instructions (Signed)

## 2013-10-27 NOTE — CV Procedure (Signed)
    PERIPHERAL VASCULAR PROCEDURE  NAME:  Julie Frank   MRN: [id number] DOB:  [birth year]   ADMIT DATE: 10/27/2013  Performing Cardiologist: Kathlyn Sacramento Primary Physician: Estill Dooms, MD Primary Cardiologist:  Dr. Martinique  Procedures Performed:  Abdominal Aortic Angiogram with Bi-Iliofemoral Runoff  Bilateral Lower Extremity Angiography (1st Order)    Indication(s):    Critical Limb Ischemia   Consent: The procedure with Risks/Benefits/Alternatives and Indications was reviewed with the patient .  All questions were answered.  Medications:  Sedation:  None.   Contrast:  105 ml  Visipaque   Procedural details: The right groin was prepped, draped, and anesthetized with 1% lidocaine. Using modified Seldinger technique, a 5 French sheath was introduced into the right common femoral artery after using a micropuncture kit. A 5 Fr Short Pigtail Catheter was advanced of over a  Versicore wire into the descending Aorta to a level just above the renal arteries. A power injection of 61m/sec contrast over 1 sec was performed for Abdominal Aortic Angiography.   Bilateral lower extremity arterial run off was then performed via power injection of 7 ml / sec contrast for a total of 77 ml.  The patient tolerated the procedure well with no immediate complications.     Hemodynamics:  Central Aortic Pressure / Mean Aortic Pressure: 132/50  Findings:  Abdominal aorta: Very tortuous with no evidence of aneurysm. Significant atheroma noted with nonobstructive disease.   Left renal artery: Minor irregularities.  Right renal artery: Minor irregularities.  Celiac artery: Not visualized.  Superior mesenteric artery: Patent  Right common iliac artery: 40% ostial stenosis.   Right internal iliac artery: Mild diffuse atherosclerosis.  Right external iliac artery: Minor irregularities.  Right common femoral artery: Minor irregularities.  Right profunda femoral artery: Minor  irregularities.  Right superficial femoral artery: Moderate diffuse disease in the proximal and midsegment. The vessel is occluded distally with occluded popliteal artery. There is reconstitution via collaterals into the anterior tibial artery which is the only patent vessel below the knee.  Left common iliac artery:  Tortuous with minor irregularities.  Left internal iliac artery: Patent  Left external iliac artery: Minor irregularities.  Left common femoral artery: Normal  Left profunda femoral artery: Mild diffuse nonobstructive disease.  Left superficial femoral artery:  Long occlusion in the midsegment with reconstitution via collaterals from the profunda in the distal segment.  Left popliteal artery: Occluded just above the knee with reconstitution via collaterals below the knee.  Left tibial peroneal trunk: Occluded  Left anterior tibial artery: Patent with mild diffuse atherosclerosis. There is reconstitution of the peroneal artery.   Conclusions: 1. No significant aortoiliac disease. 2. Long occlusion of the left SFA as well as occluded popliteal artery with one-vessel runoff below the knee via the anterior tibial artery. 3. Occluded right distal SFA and popliteal artery with reconstitution via collaterals into the anterior tibial artery which is the only patent vessel below the knee.  Recommendations:  Unfortunately, no easy options for endovascular intervention as she has 2 chronic occlusions on the left side involving the SFA and popliteal artery with one-vessel runoff. Considering her age, I recommend aggressive wound care and antibiotics. Endovascular intervention would be a last resort.   MKathlyn Sacramento MD, [facility]8/07/2013 12:48 PM

## 2013-10-27 NOTE — H&P (View-Only) (Signed)
Primary cardiologist: Dr. Nonie Hoyer.   HPI  Julie Frank is a 80 year old female who was referred for evaluation of nonhealing ulcers and peripheral arterial disease. She is accompanied by her daughter. She has been residing at [facility]. She has a history of chronic diastolic heart failure and history of SVT. In December 2013, she suffered an acute CVA associated with a dysphasia, right hemiparesis, and visual loss. She received lytic therapy with significant resolution. She also has prior history of urinary tract infection, dehydration, and aspiration. Subsequent swallow evaluation was performed and she is now using a mechanical soft diet and "thick-it". She is very hard of hearing. She ambulates by use of a wheelchair.  She denies any recent chest pain or shortness of breath.  She has two nonhealing ulcers on the left lower extremity. These ulcers are located on the left great toe as well as her left heel. She also notes a left decubitus ulcer that is being treated as a stage II ulcer. She has been receiving treatment at at local wound care center with no significant improvement.  She is very hard of hearing. She denies claudication.  No Known Allergies   Current Outpatient Prescriptions on File Prior to Visit  Medication Sig Dispense Refill  . acetaminophen (TYLENOL) 325 MG tablet Take 650 mg by mouth every 12 (twelve) hours.      Marland Kitchen amLODipine (NORVASC) 5 MG tablet Take 1 tablet (5 mg total) by mouth daily.  30 tablet  0  . Calcium Carb-Cholecalciferol (CALCIUM 600 + D) 600-200 MG-UNIT TABS Take 1 tablet by mouth 2 (two) times daily.       Marland Kitchen levothyroxine (SYNTHROID, LEVOTHROID) 25 MCG tablet Take 25 mcg by mouth daily.        . Multiple Vitamin (MULTIVITAMIN) capsule Take 1 capsule by mouth daily.      Marland Kitchen omeprazole (PRILOSEC) 20 MG capsule Take 20 mg by mouth daily.      . simvastatin (ZOCOR) 20 MG tablet Take 20 mg by mouth every evening.       No current  facility-administered medications on file prior to visit.     Past Medical History  Diagnosis Date  . Mitral insufficiency     CHRONIC  . Hypertension   . Debility     GENERALIZED  . OA (osteoarthritis)   . OP (osteoporosis)   . CHF (congestive heart failure)   . History of diastolic dysfunction   . Muscle weakness (generalized)   . Difficulty walking   . Metabolic encephalopathy   . Hypothyroidism   . HOH (hard of hearing)     "extremely"  . SVT (supraventricular tachycardia)     nonsustained  . Heart murmur   . Repeated falls 2010    "3 serious falls; hit head each time; staples 1st 2 times"  . Vascular dementia dx'd 2011  . Peripheral vascular disease   . Dry cough 03/26/11    "chronic; dx'd years ago as allergy related type of thing"  . Bruises easily   . Chronic back pain greater than 3 months duration   . CVA (cerebrovascular accident) 03/2006; 04/2009    residual "speech problems & short term memory decreased"  . PNA (pneumonia)   . UTI (lower urinary tract infection)   . Hyperlipidemia      Past Surgical History  Procedure Laterality Date  . Removal colon polpys  12/2000    Villous adenoma of the cecum.  . Tonsillectomy and adenoidectomy    .  Appendectomy    . Cataract extraction w/ intraocular lens  implant, bilateral  1984  . Dilation and curettage of uterus  1960's    "several"     Family History  Problem Relation Age of Onset  . Heart disease Mother      History   Social History  . Marital Status: Single    Spouse Name: N/A    Number of Children: 1  . Years of Education: N/A   Occupational History  . Network engineer     retired   Social History Main Topics  . Smoking status: Never Smoker   . Smokeless tobacco: Never Used  . Alcohol Use: No  . Drug Use: No  . Sexual Activity: Not Currently   Other Topics Concern  . Not on file   Social History Narrative  . No narrative on file     ROS A 10 point review of system was performed. It  is negative other than that mentioned in the history of present illness.   PHYSICAL EXAM   BP 120/59  Pulse 62  Ht 5' (1.524 m)  Wt 103 lb (46.72 kg)  BMI 20.12 kg/m2 Constitutional: She is oriented to person, place, and time. She appears frail. No distress.  HENT: No nasal discharge.  Head: Normocephalic and atraumatic.  Eyes: Pupils are equal and round. No discharge.  Neck: Normal range of motion. Neck supple. No JVD present. No thyromegaly present.  Cardiovascular: Normal rate, regular rhythm, normal heart sounds. Exam reveals no gallop and no friction rub. No murmur heard.  Pulmonary/Chest: Effort normal and breath sounds normal. No stridor. No respiratory distress. She has no wheezes. She has no rales. She exhibits no tenderness.  Abdominal: Soft. Bowel sounds are normal. She exhibits no distension. There is no tenderness. There is no rebound and no guarding.  Musculoskeletal: Normal range of motion. She exhibits no edema and no tenderness.  Neurological: She is alert and oriented to person, place, and time. Coordination normal.  Skin: Skin is warm and dry. No rash noted. She is not diaphoretic. No erythema. No pallor.  Psychiatric: She has a normal mood and affect. Her behavior is normal. Judgment and thought content normal.  Vascular: Femoral pulses are palpable. Distal pulses are nonpalpable. There is a 3 cm ulceration on the left heel with a very foul smell. There is a small ulceration on the left big toe    ASSESSMENT AND PLAN

## 2013-10-28 ENCOUNTER — Telehealth: Payer: Self-pay | Admitting: Cardiology

## 2013-10-29 NOTE — Telephone Encounter (Signed)
Spoke to patient's daughter [name] she wanted to make sure Dr.Jordan is aware of visit with Dr.Arida.Wanted to ask Dr.Jordan if mother needs to see him before 10/15.Spoke to [place] he advised to keep appointment with him 01/13/14.

## 2013-11-01 ENCOUNTER — Encounter: Payer: Self-pay | Admitting: Cardiology

## 2013-11-03 ENCOUNTER — Telehealth: Payer: Self-pay | Admitting: Cardiovascular Disease

## 2013-11-03 DIAGNOSIS — L89609 Pressure ulcer of unspecified heel, unspecified stage: Secondary | ICD-10-CM | POA: Diagnosis not present

## 2013-11-03 DIAGNOSIS — I739 Peripheral vascular disease, unspecified: Secondary | ICD-10-CM

## 2013-11-03 DIAGNOSIS — L8994 Pressure ulcer of unspecified site, stage 4: Secondary | ICD-10-CM | POA: Diagnosis not present

## 2013-11-03 DIAGNOSIS — L89899 Pressure ulcer of other site, unspecified stage: Secondary | ICD-10-CM | POA: Diagnosis not present

## 2013-11-03 DIAGNOSIS — L89309 Pressure ulcer of unspecified buttock, unspecified stage: Secondary | ICD-10-CM | POA: Diagnosis present

## 2013-11-03 DIAGNOSIS — L8992 Pressure ulcer of unspecified site, stage 2: Secondary | ICD-10-CM | POA: Diagnosis not present

## 2013-11-03 MED ORDER — CEPHALEXIN 500 MG PO CAPS: 500.0000 mg | ORAL_CAPSULE | Freq: Two times a day (BID) | ORAL | Status: DC

## 2013-11-03 NOTE — Telephone Encounter (Signed)
New message     Pt finished keflex yesterday.  Daughter says Dr Fletcher Anon want her to stay on medication until he sees her on Tuesday.  Please fax a presc for keflex to [phone].  Patient lives at [place].

## 2013-11-03 NOTE — Telephone Encounter (Signed)
Continue Keflex another week. Send refill.

## 2013-11-03 NOTE — Telephone Encounter (Signed)
Contacted pts Daughter to inform her that Dr Fletcher Anon ok for her mom to continue on Keflex 500 mg BID x 7 days.  Informed the Daughter that I gave the verbal order to a nurse at [facility] about this.  The nurse is to fax Lauren, Dr Tyrell Antonio nurse the orders to sign and refax.  Daughter verbalized understanding and very gracious for all the assistance provided.

## 2013-11-03 NOTE — Telephone Encounter (Signed)
Contacted pts nursing home [facility] to give a verbal order per Dr Fletcher Anon, that the pt should continue taking Keflex 500mg  BID for 7 more days.  Informed nurse if she needs Dr Fletcher Anon to sign the orders when he returns to the office,  to fax this to [place] at [phone]- attention Lauren.  Informed nurse to leave her contact information and fax number and Lauren will fax this back once Dr Fletcher Anon signs the order.  Nurse Phineas Real verbalized understanding and orders given.

## 2013-11-03 NOTE — Telephone Encounter (Signed)
New message    Patient daughter calling asking for a call back today from [facility].     Daughter was returning call back.

## 2013-11-03 NOTE — Telephone Encounter (Signed)
Tried contacting Daughter back about abt request.  No answer and no VM set up.  Will route this message to Dr Fletcher Anon for further review and recommendation and follow-up thereafter with provided fax.

## 2013-11-09 ENCOUNTER — Encounter: Payer: Self-pay | Admitting: Cardiovascular Disease

## 2013-11-09 ENCOUNTER — Ambulatory Visit (INDEPENDENT_AMBULATORY_CARE_PROVIDER_SITE_OTHER): Payer: Medicare Other | Admitting: Cardiovascular Disease

## 2013-11-09 VITALS — BP 90/50 | HR 78 | Ht 60.0 in | Wt 96.0 lb

## 2013-11-09 DIAGNOSIS — I6350 Cerebral infarction due to unspecified occlusion or stenosis of unspecified cerebral artery: Secondary | ICD-10-CM

## 2013-11-09 DIAGNOSIS — I739 Peripheral vascular disease, unspecified: Secondary | ICD-10-CM

## 2013-11-09 NOTE — Assessment & Plan Note (Signed)
The patient has critical limb ischemia with nonhealing ulcers mainly involving the left lower extremity. Angiography showed heavily calcified and chronically occluded left SFA and popliteal arteries with 1 vessel runoff below the knee. Unfortunately, endovascular intervention would be associated with significant procedure related risk and low success rate. She is very frail. I had a prolonged discussion with the daughter about the options. She is really not having any discomfort at the present time. I recommend continuing wound care. Hyperbaric oxygen might be a reasonable option. If her wounds do not improve and continue to worsen, the best option might be above the knee amputation considering that she is wheelchair-bound and very frail.

## 2013-11-09 NOTE — Patient Instructions (Signed)
Your physician recommends that you schedule a follow-up appointment as needed with Dr. Arida   

## 2013-11-09 NOTE — Progress Notes (Signed)
Primary cardiologist: Dr. Martinique.   HPI  [name] is a 80 year old female who is here today for followup visit regarding nonhealing ulcers and peripheral arterial disease. She is accompanied by her daughter. She has been residing at [facility]. She has a history of chronic diastolic heart failure and history of SVT. In December 2013, she suffered an acute CVA associated with a dysphasia, right hemiparesis, and visual loss. She received lytic therapy with significant resolution. She also has prior history of urinary tract infection, dehydration, and aspiration. Subsequent swallow evaluation was performed and she is now using a mechanical soft diet and "thick-it". She is very hard of hearing. She ambulates by use of a wheelchair.  She denies any recent chest pain or shortness of breath.  She has two nonhealing ulcers on the left lower extremity. These ulcers are located on the left great toe as well as her left heel. She also notes a left decubitus ulcer that is being treated as a stage II ulcer. She has been receiving treatment at at local wound care center with no significant improvement.  She is very hard of hearing. She denies claudication or any pain.  She underwent angiography which showed: 1. No significant aortoiliac disease.  2. Long occlusion of the left SFA as well as occluded popliteal artery with one-vessel runoff below the knee via the anterior tibial artery.  3. Occluded right distal SFA and popliteal artery with reconstitution via collaterals into the anterior tibial artery which is the only patent vessel below the knee.   No Known Allergies   Current Outpatient Prescriptions on File Prior to Visit  Medication Sig Dispense Refill  . acetaminophen (TYLENOL) 325 MG tablet Take 650 mg by mouth every 12 (twelve) hours.      . Amino Acids-Protein Hydrolys (FEEDING SUPPLEMENT, PRO-STAT SUGAR FREE 64,) LIQD Take 30 mLs by mouth 2 (two) times daily.      Marland Kitchen  amLODipine (NORVASC) 5 MG tablet Take 1 tablet (5 mg total) by mouth daily.  30 tablet  0  . aspirin EC 325 MG tablet Take 325 mg by mouth daily.      . Calcium Carb-Cholecalciferol (CALCIUM 600 + D) 600-200 MG-UNIT TABS Take 1 tablet by mouth 2 (two) times daily.       . cephALEXin (KEFLEX) 500 MG capsule Take 1 capsule (500 mg total) by mouth 2 (two) times daily.      Marland Kitchen levothyroxine (SYNTHROID, LEVOTHROID) 25 MCG tablet Take 25 mcg by mouth daily.        . Multiple Vitamin (MULTIVITAMIN) capsule Take 1 capsule by mouth daily.      Marland Kitchen omeprazole (PRILOSEC) 20 MG capsule Take 20 mg by mouth daily.      . simvastatin (ZOCOR) 20 MG tablet Take 20 mg by mouth every evening.      . traMADol (ULTRAM) 50 MG tablet Take 50 mg by mouth every 6 (six) hours as needed for moderate pain.       No current facility-administered medications on file prior to visit.     Past Medical History  Diagnosis Date  . Mitral insufficiency     CHRONIC  . Hypertension   . Debility     GENERALIZED  . OA (osteoarthritis)   . OP (osteoporosis)   . CHF (congestive heart failure)   . History of diastolic dysfunction   . Muscle weakness (generalized)   . Difficulty walking   . Metabolic encephalopathy   . Hypothyroidism   .  HOH (hard of hearing)     "extremely"  . SVT (supraventricular tachycardia)     nonsustained  . Heart murmur   . Repeated falls 2010    "3 serious falls; hit head each time; staples 1st 2 times"  . Vascular dementia dx'd 2011  . Peripheral vascular disease   . Dry cough 03/26/11    "chronic; dx'd years ago as allergy related type of thing"  . Bruises easily   . Chronic back pain greater than 3 months duration   . CVA (cerebrovascular accident) 03/2006; 04/2009    residual "speech problems & short term memory decreased"  . PNA (pneumonia)   . UTI (lower urinary tract infection)   . Hyperlipidemia      Past Surgical History  Procedure Laterality Date  . Removal colon polpys  12/2000      Villous adenoma of the cecum.  . Tonsillectomy and adenoidectomy    . Appendectomy    . Cataract extraction w/ intraocular lens  implant, bilateral  1984  . Dilation and curettage of uterus  1960's    "several"     Family History  Problem Relation Age of Onset  . Heart disease Mother      History   Social History  . Marital Status: Single    Spouse Name: N/A    Number of Children: 1  . Years of Education: N/A   Occupational History  . Network engineer     retired   Social History Main Topics  . Smoking status: Never Smoker   . Smokeless tobacco: Never Used  . Alcohol Use: No  . Drug Use: No  . Sexual Activity: Not Currently   Other Topics Concern  . Not on file   Social History Narrative  . No narrative on file     ROS A 10 point review of system was performed. It is negative other than that mentioned in the history of present illness.   PHYSICAL EXAM   BP 90/50  Pulse 78  Ht 5' (1.524 m)  Wt 96 lb (43.545 kg)  BMI 18.75 kg/m2 Constitutional: She is oriented to person, place, and time. She appears frail. No distress.  HENT: No nasal discharge.  Head: Normocephalic and atraumatic.  Eyes: Pupils are equal and round. No discharge.  Neck: Normal range of motion. Neck supple. No JVD present. No thyromegaly present.  Cardiovascular: Normal rate, regular rhythm, normal heart sounds. Exam reveals no gallop and no friction rub. No murmur heard.  Pulmonary/Chest: Effort normal and breath sounds normal. No stridor. No respiratory distress. She has no wheezes. She has no rales. She exhibits no tenderness.  Abdominal: Soft. Bowel sounds are normal. She exhibits no distension. There is no tenderness. There is no rebound and no guarding.  Musculoskeletal: Normal range of motion. She exhibits no edema and no tenderness.  Neurological: She is alert and oriented to person, place, and time. Coordination normal.  Skin: Skin is warm and dry. No rash noted. She is not  diaphoretic. No erythema. No pallor.  Psychiatric: She has a normal mood and affect. Her behavior is normal. Judgment and thought content normal.  Vascular: Femoral pulses are palpable. Distal pulses are nonpalpable. There is a 3 cm ulceration on the left heel with a very foul smell. There is a small ulceration on the left big toe No groin hematoma   ASSESSMENT AND PLAN

## 2013-11-10 DIAGNOSIS — L89609 Pressure ulcer of unspecified heel, unspecified stage: Secondary | ICD-10-CM | POA: Diagnosis not present

## 2013-11-10 DIAGNOSIS — L89309 Pressure ulcer of unspecified buttock, unspecified stage: Secondary | ICD-10-CM | POA: Diagnosis not present

## 2013-11-10 DIAGNOSIS — L8992 Pressure ulcer of unspecified site, stage 2: Secondary | ICD-10-CM | POA: Diagnosis not present

## 2013-11-10 DIAGNOSIS — L89899 Pressure ulcer of other site, unspecified stage: Secondary | ICD-10-CM | POA: Diagnosis not present

## 2013-11-11 ENCOUNTER — Telehealth: Payer: Self-pay | Admitting: Cardiovascular Disease

## 2013-11-11 DIAGNOSIS — I739 Peripheral vascular disease, unspecified: Secondary | ICD-10-CM

## 2013-11-11 NOTE — Telephone Encounter (Signed)
New Message  Pt daughter called to discuss a few things.. Did not want to disclose any information. Please call back to discuss.. Please call before 1 pm and only request to speak with Lauren.

## 2013-11-11 NOTE — Telephone Encounter (Signed)
Order placed for referral to Dr Trula Slade.  I spoke with the pt's daughter and she would like to contact [facility] to arrange appointment.

## 2013-11-11 NOTE — Telephone Encounter (Signed)
I spoke with the pt's daughter and she took the pt to the [facility] yesterday. She said the pt's heel looks terrible and they removed tissue and her wound is almost to the bone.  [name] did speak with Dr Harless Nakayama and he did not think the pt is appropriate for the hyperbaric chamber due to age. [name] would like to know if Dr Fletcher Anon can refer the pt to [facility] so that she is already familiar with a physician if her mother requires amputation.  [name] would also like to know if Dr Fletcher Anon recommends a particular physician at [facility]. I will forward this message to Dr Fletcher Anon for review.

## 2013-11-11 NOTE — Telephone Encounter (Signed)
Please refer to Dr. Trula Slade.

## 2013-11-15 ENCOUNTER — Encounter: Payer: Self-pay | Admitting: Cardiovascular Disease

## 2013-11-15 ENCOUNTER — Telehealth: Payer: Self-pay | Admitting: Cardiology

## 2013-11-15 NOTE — Telephone Encounter (Signed)
CLOSE ENCOUNTER °

## 2013-11-15 NOTE — Telephone Encounter (Signed)
New message     Want to give Lauren an update regarding vein and vascular

## 2013-11-15 NOTE — Telephone Encounter (Signed)
This encounter was created in error - please disregard.

## 2013-11-23 ENCOUNTER — Encounter (HOSPITAL_COMMUNITY): Payer: Self-pay | Admitting: Emergency Medicine

## 2013-11-23 ENCOUNTER — Inpatient Hospital Stay (HOSPITAL_COMMUNITY)
Admission: EM | Admit: 2013-11-23 | Discharge: 2013-12-01 | DRG: 689 | Disposition: A | Discharge status: Skilled Nursing Facility | Payer: Medicare Other | Attending: Internal Medicine | Admitting: Internal Medicine

## 2013-11-23 ENCOUNTER — Telehealth: Payer: Self-pay | Admitting: Cardiovascular Disease

## 2013-11-23 ENCOUNTER — Telehealth: Payer: Self-pay | Admitting: Cardiology

## 2013-11-23 ENCOUNTER — Encounter: Payer: Self-pay | Admitting: Cardiovascular Disease

## 2013-11-23 ENCOUNTER — Emergency Department (HOSPITAL_COMMUNITY): Payer: Medicare Other

## 2013-11-23 DIAGNOSIS — F0390 Unspecified dementia without behavioral disturbance: Secondary | ICD-10-CM | POA: Diagnosis present

## 2013-11-23 DIAGNOSIS — Z9849 Cataract extraction status, unspecified eye: Secondary | ICD-10-CM

## 2013-11-23 DIAGNOSIS — I471 Supraventricular tachycardia: Secondary | ICD-10-CM

## 2013-11-23 DIAGNOSIS — R319 Hematuria, unspecified: Secondary | ICD-10-CM

## 2013-11-23 DIAGNOSIS — M1990 Unspecified osteoarthritis, unspecified site: Secondary | ICD-10-CM | POA: Diagnosis present

## 2013-11-23 DIAGNOSIS — I5032 Chronic diastolic (congestive) heart failure: Secondary | ICD-10-CM

## 2013-11-23 DIAGNOSIS — L97409 Non-pressure chronic ulcer of unspecified heel and midfoot with unspecified severity: Secondary | ICD-10-CM | POA: Diagnosis present

## 2013-11-23 DIAGNOSIS — R410 Disorientation, unspecified: Secondary | ICD-10-CM

## 2013-11-23 DIAGNOSIS — Z8679 Personal history of other diseases of the circulatory system: Secondary | ICD-10-CM

## 2013-11-23 DIAGNOSIS — I739 Peripheral vascular disease, unspecified: Secondary | ICD-10-CM | POA: Diagnosis present

## 2013-11-23 DIAGNOSIS — Z681 Body mass index (BMI) 19 or less, adult: Secondary | ICD-10-CM

## 2013-11-23 DIAGNOSIS — E870 Hyperosmolality and hypernatremia: Secondary | ICD-10-CM | POA: Diagnosis present

## 2013-11-23 DIAGNOSIS — I509 Heart failure, unspecified: Secondary | ICD-10-CM | POA: Diagnosis present

## 2013-11-23 DIAGNOSIS — N390 Urinary tract infection, site not specified: Principal | ICD-10-CM | POA: Diagnosis present

## 2013-11-23 DIAGNOSIS — Z7401 Bed confinement status: Secondary | ICD-10-CM

## 2013-11-23 DIAGNOSIS — F0150 Vascular dementia without behavioral disturbance: Secondary | ICD-10-CM

## 2013-11-23 DIAGNOSIS — B964 Proteus (mirabilis) (morganii) as the cause of diseases classified elsewhere: Secondary | ICD-10-CM | POA: Diagnosis present

## 2013-11-23 DIAGNOSIS — E039 Hypothyroidism, unspecified: Secondary | ICD-10-CM | POA: Diagnosis present

## 2013-11-23 DIAGNOSIS — A419 Sepsis, unspecified organism: Secondary | ICD-10-CM

## 2013-11-23 DIAGNOSIS — Z7982 Long term (current) use of aspirin: Secondary | ICD-10-CM

## 2013-11-23 DIAGNOSIS — Z79899 Other long term (current) drug therapy: Secondary | ICD-10-CM

## 2013-11-23 DIAGNOSIS — E43 Unspecified severe protein-calorie malnutrition: Secondary | ICD-10-CM | POA: Insufficient documentation

## 2013-11-23 DIAGNOSIS — M810 Age-related osteoporosis without current pathological fracture: Secondary | ICD-10-CM | POA: Diagnosis present

## 2013-11-23 DIAGNOSIS — R7989 Other specified abnormal findings of blood chemistry: Secondary | ICD-10-CM | POA: Diagnosis present

## 2013-11-23 DIAGNOSIS — R531 Weakness: Secondary | ICD-10-CM

## 2013-11-23 DIAGNOSIS — R634 Abnormal weight loss: Secondary | ICD-10-CM

## 2013-11-23 DIAGNOSIS — Z961 Presence of intraocular lens: Secondary | ICD-10-CM

## 2013-11-23 DIAGNOSIS — R4182 Altered mental status, unspecified: Secondary | ICD-10-CM | POA: Diagnosis present

## 2013-11-23 DIAGNOSIS — R404 Transient alteration of awareness: Secondary | ICD-10-CM | POA: Diagnosis not present

## 2013-11-23 DIAGNOSIS — I340 Nonrheumatic mitral (valve) insufficiency: Secondary | ICD-10-CM

## 2013-11-23 DIAGNOSIS — L97429 Non-pressure chronic ulcer of left heel and midfoot with unspecified severity: Secondary | ICD-10-CM

## 2013-11-23 DIAGNOSIS — Z66 Do not resuscitate: Secondary | ICD-10-CM | POA: Diagnosis present

## 2013-11-23 DIAGNOSIS — R778 Other specified abnormalities of plasma proteins: Secondary | ICD-10-CM

## 2013-11-23 DIAGNOSIS — G9340 Encephalopathy, unspecified: Secondary | ICD-10-CM | POA: Diagnosis present

## 2013-11-23 DIAGNOSIS — H9190 Unspecified hearing loss, unspecified ear: Secondary | ICD-10-CM | POA: Diagnosis present

## 2013-11-23 DIAGNOSIS — I639 Cerebral infarction, unspecified: Secondary | ICD-10-CM

## 2013-11-23 DIAGNOSIS — E785 Hyperlipidemia, unspecified: Secondary | ICD-10-CM | POA: Diagnosis present

## 2013-11-23 DIAGNOSIS — I10 Essential (primary) hypertension: Secondary | ICD-10-CM | POA: Diagnosis present

## 2013-11-23 DIAGNOSIS — R234 Changes in skin texture: Secondary | ICD-10-CM

## 2013-11-23 DIAGNOSIS — Z515 Encounter for palliative care: Secondary | ICD-10-CM

## 2013-11-23 DIAGNOSIS — R509 Fever, unspecified: Secondary | ICD-10-CM

## 2013-11-23 LAB — CBC WITH DIFFERENTIAL/PLATELET
BASOS PCT: 0 % (ref 0–1)
Basophils Absolute: 0 10*3/uL (ref 0.0–0.1)
EOS ABS: 0.1 10*3/uL (ref 0.0–0.7)
Eosinophils Relative: 1 % (ref 0–5)
HCT: 37.2 % (ref 36.0–46.0)
HEMOGLOBIN: 11.6 g/dL — AB (ref 12.0–15.0)
Lymphocytes Relative: 12 % (ref 12–46)
Lymphs Abs: 1.1 10*3/uL (ref 0.7–4.0)
MCH: 28.9 pg (ref 26.0–34.0)
MCHC: 31.2 g/dL (ref 30.0–36.0)
MCV: 92.8 fL (ref 78.0–100.0)
MONOS PCT: 9 % (ref 3–12)
Monocytes Absolute: 0.9 10*3/uL (ref 0.1–1.0)
Neutro Abs: 7.7 10*3/uL (ref 1.7–7.7)
Neutrophils Relative %: 78 % — ABNORMAL HIGH (ref 43–77)
Platelets: 336 10*3/uL (ref 150–400)
RBC: 4.01 MIL/uL (ref 3.87–5.11)
RDW: 15.4 % (ref 11.5–15.5)
WBC: 9.9 10*3/uL (ref 4.0–10.5)

## 2013-11-23 LAB — URINALYSIS, ROUTINE W REFLEX MICROSCOPIC
BILIRUBIN URINE: NEGATIVE
Glucose, UA: NEGATIVE mg/dL
Ketones, ur: NEGATIVE mg/dL
NITRITE: POSITIVE — AB
PROTEIN: 30 mg/dL — AB
SPECIFIC GRAVITY, URINE: 1.016 (ref 1.005–1.030)
UROBILINOGEN UA: 0.2 mg/dL (ref 0.0–1.0)
pH: 7.5 (ref 5.0–8.0)

## 2013-11-23 LAB — COMPREHENSIVE METABOLIC PANEL
ALT: 12 U/L (ref 0–35)
AST: 21 U/L (ref 0–37)
Albumin: 2.3 g/dL — ABNORMAL LOW (ref 3.5–5.2)
Alkaline Phosphatase: 70 U/L (ref 39–117)
Anion gap: 10 (ref 5–15)
BUN: 26 mg/dL — ABNORMAL HIGH (ref 6–23)
CO2: 24 mEq/L (ref 19–32)
Calcium: 8 mg/dL — ABNORMAL LOW (ref 8.4–10.5)
Chloride: 114 mEq/L — ABNORMAL HIGH (ref 96–112)
Creatinine, Ser: 0.61 mg/dL (ref 0.50–1.10)
GFR calc Af Amer: 82 mL/min — ABNORMAL LOW (ref >90)
GFR calc non Af Amer: 71 mL/min — ABNORMAL LOW (ref >90)
Glucose, Bld: 100 mg/dL — ABNORMAL HIGH (ref 70–99)
POTASSIUM: 4.3 meq/L (ref 3.7–5.3)
SODIUM: 148 meq/L — AB (ref 137–147)
TOTAL PROTEIN: 5.9 g/dL — AB (ref 6.0–8.3)
Total Bilirubin: 0.4 mg/dL (ref 0.3–1.2)

## 2013-11-23 LAB — CK: Total CK: 36 U/L (ref 7–177)

## 2013-11-23 LAB — URINE MICROSCOPIC-ADD ON

## 2013-11-23 LAB — I-STAT TROPONIN, ED: Troponin i, poc: 0.24 ng/mL (ref 0.00–0.08)

## 2013-11-23 LAB — I-STAT CG4 LACTIC ACID, ED: LACTIC ACID, VENOUS: 0.94 mmol/L (ref 0.5–2.2)

## 2013-11-23 MED ORDER — SODIUM CHLORIDE 0.9 % IV BOLUS (SEPSIS)
2000.0000 mL | Freq: Once | INTRAVENOUS | Status: AC
  Administered 2013-11-23: 2000 mL via INTRAVENOUS

## 2013-11-23 NOTE — Telephone Encounter (Signed)
Pt has spotty pneumonia by chest x-ray and is taking antibiotics (Avelox).  Wound care nurse continues to follow the pt for ulcer on heal.  The pt's daughter continues to work on getting the pt treated with hyperbaric oxygen therapy but is meeting resistance with Dr Jerline Pain. I made Julie Frank aware that Dr Fletcher Anon did comment on this therapy in his 11/09/13 office note. She also asked when the pt's last Echo was performed and I made her aware 03/10/12, EF showed 50-55%. Julie Frank wondered if the pt needs a repeat Echo to proceed with hyperbaric therapy.  I made her aware that this would need to be determined by Dr Martinique if the pt gets hyperbaric oxygen therapy ordered.

## 2013-11-23 NOTE — Telephone Encounter (Signed)
Julie Frank called in wanting to know should another echo be done to update her injection fraction numbers to prepare her for the hyperbaric chamber program. And she would like to know would insurance cover it considering that the last was done in 2013. Please call  thanks

## 2013-11-23 NOTE — Telephone Encounter (Signed)
New message     Daughter called on yesterday spoke with scheduler . Would like a call back this am.    Discuss further procedure - wound care. Will go into further details when the nurse calls back.

## 2013-11-23 NOTE — Telephone Encounter (Signed)
Returned a call to patient's daughter. She is quite anxious for her mom to see Dr. Martinique so that she can have her evaluated for the need to have a echo. Dr. Martinique has cancellation tomorrow @ 2:15. Will move patient's appointment to this spot.

## 2013-11-23 NOTE — Telephone Encounter (Signed)
This encounter was created in error - please disregard.

## 2013-11-23 NOTE — Telephone Encounter (Signed)
No answer at home number.  I will try to reach [facility] later today.

## 2013-11-23 NOTE — ED Notes (Signed)
Pt is from [facility]. EMS called for wound check. Pt has wound on right lower leg- being treated in hyperbaric chamber for wound. Per EMS, RN at [facility] states there is no arterial flow to left lower leg. The wound has been weeping more than normal- and per report, no blood flow. Unsure if this is new. Pt has dementia from previous stroke. Pt lethargic but will respond, this is not her norm to be so lethargic. CBG 147, HR irregular with hx of Afib per EMS. BP 134/98. Pt is a DNR

## 2013-11-23 NOTE — Telephone Encounter (Signed)
New message     Julie Frank want a copy of the 11-09-13 office note.  She said you read it to her but she want a copy.

## 2013-11-23 NOTE — ED Provider Notes (Signed)
CSN: [id number]     Arrival date & time 11/23/13  1838 History   First MD Initiated Contact with Patient 11/23/13 2130     Chief Complaint  Patient presents with  . Wound Check   5 caveat dementia history is obtained from patient's records and from her daughter accompanies her  (Consider location/radiation/quality/duration/timing/severity/associated sxs/prior Treatment) HPI Patient has been more sleepy not eating or drinking over the past several days. Patient nurse at skilled nursing facility reports that the wound on her left foot has looked progressively worse however her daughter reports that her wound is only minimally worse than several days ago., More reddened around the edges. She's had a chronic wound at her left heel for several months. Daughter is mostly concerned that patient is so sleepy and likely dehydrated. She is probably being treated for pneumonia with Avelox. Past Medical History  Diagnosis Date  . Mitral insufficiency     CHRONIC  . Hypertension   . Debility     GENERALIZED  . OA (osteoarthritis)   . OP (osteoporosis)   . CHF (congestive heart failure)   . History of diastolic dysfunction   . Muscle weakness (generalized)   . Difficulty walking   . Metabolic encephalopathy   . Hypothyroidism   . HOH (hard of hearing)     "extremely"  . SVT (supraventricular tachycardia)     nonsustained  . Heart murmur   . Repeated falls 2010    "3 serious falls; hit head each time; staples 1st 2 times"  . Vascular dementia dx'd 2011  . Peripheral vascular disease   . Dry cough 03/26/11    "chronic; dx'd years ago as allergy related type of thing"  . Bruises easily   . Chronic back pain greater than 3 months duration   . CVA (cerebrovascular accident) 03/2006; 04/2009    residual "speech problems & short term memory decreased"  . PNA (pneumonia)   . UTI (lower urinary tract infection)   . Hyperlipidemia    Past Surgical History  Procedure Laterality Date  . Removal  colon polpys  12/2000    Villous adenoma of the cecum.  . Tonsillectomy and adenoidectomy    . Appendectomy    . Cataract extraction w/ intraocular lens  implant, bilateral  1984  . Dilation and curettage of uterus  1960's    "several"   Family History  Problem Relation Age of Onset  . Heart disease Mother    History  Substance Use Topics  . Smoking status: Never Smoker   . Smokeless tobacco: Never Used  . Alcohol Use: No   OB History   Grav Para Term Preterm Abortions TAB SAB Ect Mult Living                 Review of Systems  Unable to perform ROS: Dementia  Constitutional: Positive for activity change and appetite change.       Sleeping more poor appetite  Skin: Positive for wound.       Wounds on left foot and presacral area  Neurological: Positive for weakness.      Allergies  Review of patient's allergies indicates no known allergies.  Home Medications   Prior to Admission medications   Medication Sig Start Date End Date Taking? Authorizing Provider  acetaminophen (TYLENOL) 325 MG tablet Take 650 mg by mouth every 12 (twelve) hours.   Yes Historical Provider, MD  Amino Acids-Protein Hydrolys (FEEDING SUPPLEMENT, PRO-STAT SUGAR FREE 64,) LIQD Take 30 mLs by  mouth 2 (two) times daily.   Yes Historical Provider, MD  amLODipine (NORVASC) 5 MG tablet Take 5 mg by mouth daily.   Yes Historical Provider, MD  aspirin EC 325 MG tablet Take 325 mg by mouth daily.   Yes Historical Provider, MD  Calcium Carb-Cholecalciferol (CALCIUM 600 + D) 600-200 MG-UNIT TABS Take 1 tablet by mouth 2 (two) times daily.    Yes Historical Provider, MD  cephALEXin (KEFLEX) 500 MG capsule Take 500 mg by mouth 2 (two) times daily.   Yes Historical Provider, MD  levothyroxine (SYNTHROID, LEVOTHROID) 25 MCG tablet Take 25 mcg by mouth daily.     Yes Historical Provider, MD  Multiple Vitamin (MULTIVITAMIN) capsule Take 1 capsule by mouth daily.   Yes Historical Provider, MD  omeprazole (PRILOSEC)  20 MG capsule Take 20 mg by mouth daily.   Yes Historical Provider, MD  simvastatin (ZOCOR) 20 MG tablet Take 20 mg by mouth every evening.   Yes Historical Provider, MD  traMADol (ULTRAM) 50 MG tablet Take 50 mg by mouth every 6 (six) hours as needed for moderate pain.   Yes Historical Provider, MD   BP 103/49  Pulse 73  Temp(Src) 97.8 F (36.6 C) (Oral)  Resp 14  SpO2 97% Physical Exam  Nursing note and vitals reviewed. Constitutional:  Chronically ill-appearing. Sleepy arousable to tactile stimulus  HENT:  Head: Normocephalic and atraumatic.  Mucous membranes dry  Eyes: Conjunctivae are normal. Pupils are equal, round, and reactive to light.  Neck: Neck supple. No tracheal deviation present. No thyromegaly present.  Cardiovascular: Normal rate and regular rhythm.   No murmur heard. Pulmonary/Chest: Effort normal and breath sounds normal.  Abdominal: Soft. Bowel sounds are normal. She exhibits no distension. There is no tenderness.  Musculoskeletal: Normal range of motion. She exhibits no edema and no tenderness.  Neurological: She is alert. Coordination normal.  Skin: Skin is warm and dry. No rash noted.  Left foot with necrotic appearing area over her entire heel with surrounding erythema. Nontender. There is also a necrotic appearing area immediately proximal to the fifth toe dorsal aspect. DP pulses absent. Right foot minimally erythematous and heel DP pulses absent. There is a dime-sized superficial decubitus ulcer a presacral area. Skin with generally poor turgor  Psychiatric: She has a normal mood and affect.    ED Course  Procedures (including critical care time) Labs Review Labs Reviewed - No data to display  Imaging Review No results found.   EKG Interpretation   Date/Time:  Tuesday November 23 2013 22:31:13 EDT Ventricular Rate:  78 PR Interval:  71 QRS Duration: 131 QT Interval:  471 QTC Calculation: 537 R Axis:   -99 Text Interpretation:  Sinus rhythm  Short PR interval Right bundle branch  block Anterolateral infarct, age indeterminate No significant change since  last tracing Confirmed by Winfred Leeds  MD, Krisha Beegle [phone]) on 11/23/2013 11:45:39  PM     Code sepsis called once laboratory work back. 12 midnight patient awake and alert after treatment with intravenous fluids. Appears at baseline per her daughter Chest xray viewed  By me Results for orders placed during the hospital encounter of 11/23/13  COMPREHENSIVE METABOLIC PANEL      Result Value Ref Range   Sodium 148 (*) 137 - 147 mEq/L   Potassium 4.3  3.7 - 5.3 mEq/L   Chloride 114 (*) 96 - 112 mEq/L   CO2 24  19 - 32 mEq/L   Glucose, Bld 100 (*) 70 - 99  mg/dL   BUN 26 (*) 6 - 23 mg/dL   Creatinine, Ser 0.61  0.50 - 1.10 mg/dL   Calcium 8.0 (*) 8.4 - 10.5 mg/dL   Total Protein 5.9 (*) 6.0 - 8.3 g/dL   Albumin 2.3 (*) 3.5 - 5.2 g/dL   AST 21  0 - 37 U/L   ALT 12  0 - 35 U/L   Alkaline Phosphatase 70  39 - 117 U/L   Total Bilirubin 0.4  0.3 - 1.2 mg/dL   GFR calc non Af Amer 71 (*) >90 mL/min   GFR calc Af Amer 82 (*) >90 mL/min   Anion gap 10  5 - 15  CBC WITH DIFFERENTIAL      Result Value Ref Range   WBC 9.9  4.0 - 10.5 K/uL   RBC 4.01  3.87 - 5.11 MIL/uL   Hemoglobin 11.6 (*) 12.0 - 15.0 g/dL   HCT 37.2  36.0 - 46.0 %   MCV 92.8  78.0 - 100.0 fL   MCH 28.9  26.0 - 34.0 pg   MCHC 31.2  30.0 - 36.0 g/dL   RDW 15.4  11.5 - 15.5 %   Platelets 336  150 - 400 K/uL   Neutrophils Relative % 78 (*) 43 - 77 %   Neutro Abs 7.7  1.7 - 7.7 K/uL   Lymphocytes Relative 12  12 - 46 %   Lymphs Abs 1.1  0.7 - 4.0 K/uL   Monocytes Relative 9  3 - 12 %   Monocytes Absolute 0.9  0.1 - 1.0 K/uL   Eosinophils Relative 1  0 - 5 %   Eosinophils Absolute 0.1  0.0 - 0.7 K/uL   Basophils Relative 0  0 - 1 %   Basophils Absolute 0.0  0.0 - 0.1 K/uL  URINALYSIS, ROUTINE W REFLEX MICROSCOPIC      Result Value Ref Range   Color, Urine AMBER (*) YELLOW   APPearance CLOUDY (*) CLEAR    Specific Gravity, Urine 1.016  1.005 - 1.030   pH 7.5  5.0 - 8.0   Glucose, UA NEGATIVE  NEGATIVE mg/dL   Hgb urine dipstick SMALL (*) NEGATIVE   Bilirubin Urine NEGATIVE  NEGATIVE   Ketones, ur NEGATIVE  NEGATIVE mg/dL   Protein, ur 30 (*) NEGATIVE mg/dL   Urobilinogen, UA 0.2  0.0 - 1.0 mg/dL   Nitrite POSITIVE (*) NEGATIVE   Leukocytes, UA LARGE (*) NEGATIVE  URINE MICROSCOPIC-ADD ON      Result Value Ref Range   Squamous Epithelial / LPF RARE  RARE   WBC, UA TOO NUMEROUS TO COUNT  <3 WBC/hpf   RBC / HPF 3-6  <3 RBC/hpf   Bacteria, UA FEW (*) RARE  CK      Result Value Ref Range   Total CK 36  7 - 177 U/L  I-STAT TROPOININ, ED      Result Value Ref Range   Troponin i, poc 0.24 (*) 0.00 - 0.08 ng/mL   Comment NOTIFIED PHYSICIAN     Comment 3           I-STAT CG4 LACTIC ACID, ED      Result Value Ref Range   Lactic Acid, Venous 0.94  0.5 - 2.2 mmol/L   Dg Chest Port 1 View  11/23/2013   CLINICAL DATA:  Weakness, lethargic.  EXAM: PORTABLE CHEST - 1 VIEW  COMPARISON:  12/04/2012  FINDINGS: Aortic atherosclerosis and prominence. Cardiomegaly. Small effusions and bibasilar opacities, similar  to prior. Diffuse osteopenia. Multilevel degenerative changes and osteopenia  IMPRESSION: Small effusions and bibasilar opacities ; atelectasis versus infiltrate.  Prominent cardiomediastinal contours, similar to prior.   Electronically Signed   By: Carlos Levering M.D.   On: 11/23/2013 22:53   Dg Foot Complete Left  11/23/2013   CLINICAL DATA:  Left great toe wound.  EXAM: LEFT FOOT - COMPLETE 3+ VIEW  COMPARISON:  None.  FINDINGS: Diffuse osteopenia. No displaced acute fracture or dislocation. No aggressive osseous lesion. Atherosclerotic vascular calcifications. Soft tissue irregularity overlies the calcaneus.  IMPRESSION: Diffuse osteopenia.  No acute osseous finding.  Soft tissue wound or ulceration overlies the calcaneus.  Consider MRI if concern for acute osteomyelitis persists.    Electronically Signed   By: Carlos Levering M.D.   On: 11/23/2013 22:56    MDM  Elevated troponin likely secondary to sepsis. Possible N. STEMI though no acute EKG changes. Patient is not able to give history of chest pain. Final diagnoses:  None   spoke with Dr. Alcario Drought plan admit to telemetry, intravenous fuids and antibiotics Diagnosis #1 sepsis #2 urinary tract infection #3 dehydration #4 renal insuffiency CRITICAL CARE Performed by: Orlie Dakin Total critical care time: 30 minute Critical care time was exclusive of separately billable procedures and treating other patients. Critical care was necessary to treat or prevent imminent or life-threatening deterioration. Critical care was time spent personally by me on the following activities: development of treatment plan with patient and/or surrogate as well as nursing, discussions with consultants, evaluation of patient's response to treatment, examination of patient, obtaining history from patient or surrogate, ordering and performing treatments and interventions, ordering and review of laboratory studies, ordering and review of radiographic studies, pulse oximetry and re-evaluation of patient's condition.    Orlie Dakin, MD 11/24/13 [phone]

## 2013-11-24 ENCOUNTER — Telehealth: Payer: Self-pay | Admitting: Cardiology

## 2013-11-24 ENCOUNTER — Ambulatory Visit: Payer: Medicare Other | Admitting: Cardiology

## 2013-11-24 ENCOUNTER — Encounter (HOSPITAL_BASED_OUTPATIENT_CLINIC_OR_DEPARTMENT_OTHER): Payer: Medicare Other | Attending: General Surgery

## 2013-11-24 DIAGNOSIS — E785 Hyperlipidemia, unspecified: Secondary | ICD-10-CM | POA: Diagnosis present

## 2013-11-24 DIAGNOSIS — L98499 Non-pressure chronic ulcer of skin of other sites with unspecified severity: Secondary | ICD-10-CM

## 2013-11-24 DIAGNOSIS — F0390 Unspecified dementia without behavioral disturbance: Secondary | ICD-10-CM | POA: Diagnosis present

## 2013-11-24 DIAGNOSIS — Z7401 Bed confinement status: Secondary | ICD-10-CM | POA: Diagnosis not present

## 2013-11-24 DIAGNOSIS — G9340 Encephalopathy, unspecified: Secondary | ICD-10-CM | POA: Diagnosis present

## 2013-11-24 DIAGNOSIS — E43 Unspecified severe protein-calorie malnutrition: Secondary | ICD-10-CM | POA: Insufficient documentation

## 2013-11-24 DIAGNOSIS — F0150 Vascular dementia without behavioral disturbance: Secondary | ICD-10-CM

## 2013-11-24 DIAGNOSIS — H9190 Unspecified hearing loss, unspecified ear: Secondary | ICD-10-CM | POA: Diagnosis present

## 2013-11-24 DIAGNOSIS — E039 Hypothyroidism, unspecified: Secondary | ICD-10-CM | POA: Diagnosis present

## 2013-11-24 DIAGNOSIS — I739 Peripheral vascular disease, unspecified: Secondary | ICD-10-CM

## 2013-11-24 DIAGNOSIS — Z79899 Other long term (current) drug therapy: Secondary | ICD-10-CM | POA: Diagnosis not present

## 2013-11-24 DIAGNOSIS — Z7982 Long term (current) use of aspirin: Secondary | ICD-10-CM | POA: Diagnosis not present

## 2013-11-24 DIAGNOSIS — N390 Urinary tract infection, site not specified: Secondary | ICD-10-CM | POA: Diagnosis present

## 2013-11-24 DIAGNOSIS — I509 Heart failure, unspecified: Secondary | ICD-10-CM | POA: Diagnosis present

## 2013-11-24 DIAGNOSIS — R404 Transient alteration of awareness: Secondary | ICD-10-CM

## 2013-11-24 DIAGNOSIS — L97409 Non-pressure chronic ulcer of unspecified heel and midfoot with unspecified severity: Secondary | ICD-10-CM | POA: Diagnosis present

## 2013-11-24 DIAGNOSIS — E870 Hyperosmolality and hypernatremia: Secondary | ICD-10-CM | POA: Diagnosis present

## 2013-11-24 DIAGNOSIS — I10 Essential (primary) hypertension: Secondary | ICD-10-CM | POA: Diagnosis present

## 2013-11-24 DIAGNOSIS — Z66 Do not resuscitate: Secondary | ICD-10-CM | POA: Diagnosis present

## 2013-11-24 DIAGNOSIS — R7989 Other specified abnormal findings of blood chemistry: Secondary | ICD-10-CM

## 2013-11-24 DIAGNOSIS — Z515 Encounter for palliative care: Secondary | ICD-10-CM | POA: Diagnosis not present

## 2013-11-24 DIAGNOSIS — B964 Proteus (mirabilis) (morganii) as the cause of diseases classified elsewhere: Secondary | ICD-10-CM | POA: Diagnosis present

## 2013-11-24 DIAGNOSIS — M810 Age-related osteoporosis without current pathological fracture: Secondary | ICD-10-CM | POA: Diagnosis present

## 2013-11-24 DIAGNOSIS — M1990 Unspecified osteoarthritis, unspecified site: Secondary | ICD-10-CM | POA: Diagnosis present

## 2013-11-24 DIAGNOSIS — R4182 Altered mental status, unspecified: Secondary | ICD-10-CM | POA: Diagnosis present

## 2013-11-24 DIAGNOSIS — Z961 Presence of intraocular lens: Secondary | ICD-10-CM | POA: Diagnosis not present

## 2013-11-24 DIAGNOSIS — Z681 Body mass index (BMI) 19 or less, adult: Secondary | ICD-10-CM | POA: Diagnosis not present

## 2013-11-24 DIAGNOSIS — A419 Sepsis, unspecified organism: Secondary | ICD-10-CM

## 2013-11-24 DIAGNOSIS — Z9849 Cataract extraction status, unspecified eye: Secondary | ICD-10-CM | POA: Diagnosis not present

## 2013-11-24 LAB — BASIC METABOLIC PANEL
Anion gap: 10 (ref 5–15)
BUN: 21 mg/dL (ref 6–23)
CALCIUM: 7.5 mg/dL — AB (ref 8.4–10.5)
CO2: 20 meq/L (ref 19–32)
CREATININE: 0.48 mg/dL — AB (ref 0.50–1.10)
Chloride: 115 mEq/L — ABNORMAL HIGH (ref 96–112)
GFR calc Af Amer: 89 mL/min — ABNORMAL LOW (ref >90)
GFR, EST NON AFRICAN AMERICAN: 77 mL/min — AB (ref >90)
GLUCOSE: 135 mg/dL — AB (ref 70–99)
Potassium: 3.8 mEq/L (ref 3.7–5.3)
Sodium: 145 mEq/L (ref 137–147)

## 2013-11-24 LAB — I-STAT CG4 LACTIC ACID, ED: Lactic Acid, Venous: 1.19 mmol/L (ref 0.5–2.2)

## 2013-11-24 LAB — TROPONIN I: Troponin I: <0.3 ng/mL (ref <0.30)

## 2013-11-24 MED ORDER — ENSURE COMPLETE PO LIQD
237.0000 mL | Freq: Two times a day (BID) | ORAL | Status: DC
  Administered 2013-11-24 – 2013-12-01 (×14): 237 mL via ORAL

## 2013-11-24 MED ORDER — PRO-STAT SUGAR FREE PO LIQD
30.0000 mL | Freq: Two times a day (BID) | ORAL | Status: DC
  Administered 2013-11-24 – 2013-11-25 (×4): 30 mL via ORAL
  Administered 2013-11-26: 22:00:00 via ORAL
  Administered 2013-11-26 – 2013-11-29 (×6): 30 mL via ORAL
  Administered 2013-11-29 – 2013-11-30 (×2): via ORAL
  Administered 2013-11-30 – 2013-12-01 (×2): 30 mL via ORAL
  Filled 2013-11-24 (×17): qty 30

## 2013-11-24 MED ORDER — DEXTROSE 5 % IV SOLN
1.0000 g | INTRAVENOUS | Status: AC
  Administered 2013-11-24: 1 g via INTRAVENOUS
  Filled 2013-11-24: qty 1

## 2013-11-24 MED ORDER — CALCIUM CARBONATE-VITAMIN D 500-200 MG-UNIT PO TABS
1.0000 | ORAL_TABLET | Freq: Two times a day (BID) | ORAL | Status: DC
  Administered 2013-11-24 – 2013-12-01 (×15): 1 via ORAL
  Filled 2013-11-24 (×18): qty 1

## 2013-11-24 MED ORDER — DEXTROSE 5 % IV SOLN
1.0000 g | Freq: Every day | INTRAVENOUS | Status: DC
  Administered 2013-11-24 – 2013-11-25 (×2): 1 g via INTRAVENOUS
  Filled 2013-11-24 (×3): qty 1

## 2013-11-24 MED ORDER — DEXTROSE 5 % IV SOLN
2.0000 g | Freq: Once | INTRAVENOUS | Status: DC
  Filled 2013-11-24: qty 2

## 2013-11-24 MED ORDER — TRAMADOL HCL 50 MG PO TABS: 50.0000 mg | ORAL_TABLET | Freq: Four times a day (QID) | ORAL | Status: DC | PRN

## 2013-11-24 MED ORDER — SODIUM CHLORIDE 0.9 % IV BOLUS (SEPSIS): 30.0000 mL/kg | Freq: Once | INTRAVENOUS | Status: DC

## 2013-11-24 MED ORDER — AMLODIPINE BESYLATE 5 MG PO TABS
5.0000 mg | ORAL_TABLET | Freq: Every day | ORAL | Status: DC
  Administered 2013-11-24 – 2013-12-01 (×8): 5 mg via ORAL
  Filled 2013-11-24 (×8): qty 1

## 2013-11-24 MED ORDER — SODIUM CHLORIDE 0.9 % IV SOLN
1000.0000 mL | INTRAVENOUS | Status: DC
  Administered 2013-11-24 – 2013-11-26 (×6): 1000 mL via INTRAVENOUS

## 2013-11-24 MED ORDER — LEVOTHYROXINE SODIUM 25 MCG PO TABS
25.0000 ug | ORAL_TABLET | Freq: Every day | ORAL | Status: DC
Start: 2013-11-24 — End: 2013-12-01
  Administered 2013-11-24 – 2013-12-01 (×8): 25 ug via ORAL
  Filled 2013-11-24 (×10): qty 1

## 2013-11-24 MED ORDER — RESOURCE THICKENUP CLEAR PO POWD
ORAL | Status: DC | PRN
  Filled 2013-11-24: qty 125

## 2013-11-24 MED ORDER — ASPIRIN EC 325 MG PO TBEC
325.0000 mg | DELAYED_RELEASE_TABLET | Freq: Every day | ORAL | Status: DC
  Administered 2013-11-24 – 2013-12-01 (×8): 325 mg via ORAL
  Filled 2013-11-24 (×8): qty 1

## 2013-11-24 MED ORDER — HEPARIN SODIUM (PORCINE) 5000 UNIT/ML IJ SOLN
5000.0000 [IU] | Freq: Three times a day (TID) | INTRAMUSCULAR | Status: DC
  Administered 2013-11-24 – 2013-12-01 (×23): 5000 [IU] via SUBCUTANEOUS
  Filled 2013-11-24 (×25): qty 1

## 2013-11-24 MED ORDER — SIMVASTATIN 20 MG PO TABS
20.0000 mg | ORAL_TABLET | Freq: Every evening | ORAL | Status: DC
Start: 2013-11-24 — End: 2013-12-01
  Administered 2013-11-24 – 2013-11-30 (×7): 20 mg via ORAL
  Filled 2013-11-24 (×8): qty 1

## 2013-11-24 MED ORDER — ADULT MULTIVITAMIN W/MINERALS CH
1.0000 | ORAL_TABLET | Freq: Every day | ORAL | Status: DC
  Administered 2013-11-24 – 2013-12-01 (×8): 1 via ORAL
  Filled 2013-11-24 (×8): qty 1

## 2013-11-24 MED ORDER — CETYLPYRIDINIUM CHLORIDE 0.05 % MT LIQD
7.0000 mL | Freq: Two times a day (BID) | OROMUCOSAL | Status: DC
  Administered 2013-11-24 – 2013-12-01 (×13): 7 mL via OROMUCOSAL

## 2013-11-24 MED ORDER — ACETAMINOPHEN 325 MG PO TABS
650.0000 mg | ORAL_TABLET | Freq: Two times a day (BID) | ORAL | Status: DC
  Administered 2013-11-24 – 2013-12-01 (×15): 650 mg via ORAL
  Filled 2013-11-24 (×15): qty 2

## 2013-11-24 MED ORDER — PANTOPRAZOLE SODIUM 40 MG PO TBEC
40.0000 mg | DELAYED_RELEASE_TABLET | Freq: Every day | ORAL | Status: DC
  Administered 2013-11-24 – 2013-12-01 (×8): 40 mg via ORAL
  Filled 2013-11-24 (×8): qty 1

## 2013-11-24 NOTE — Progress Notes (Signed)
INITIAL NUTRITION ASSESSMENT  Pt meets criteria for SEVERE MALNUTRITION in the context of chronic illness as evidenced by a weight loss of 7.7% in 3 months and severe fat and muscle mass depletion.  DOCUMENTATION CODES Per approved criteria  -Severe malnutrition in the context of chronic illness   INTERVENTION: Provide Ensure Complete po BID thickened to nectar thick consistency, each supplement provides 350 kcal and 13 grams of protein.  Continue 30 ml Prostat BID, each supplement provides 100 kcal and 15 grams of protein.  NUTRITION DIAGNOSIS: Malnutrition related to chronic illness as evidenced by a weight loss of 7.7% in 3 months and severe fat and muscle mass depletion.   Goal: Pt to meet >/= 90% of their estimated nutrition needs   Monitor:  PO intake, weight trends, labs, I/O's  Reason for Assessment: MST/ Low braden score  80 y.o. female  Admitting Dx: Altered mental status  ASSESSMENT: Pt presents with being more sleepy and not eating or drinking anything over the past couple of days. Pt with PMH of HTN, CHF, osteoporosis, dementia, and mitral insufficiency.  Pt reports she has a good appetite. Meal completion is 100%. RN reports pt does not like the cherry flavored Prostat, but will try the vanilla flavored for the pt to try. Pt reports she is willing to try Ensure. Will order. Unable to obtain nutrition hx. Noted pt with a 7.7% weight loss in 3 months.   Nutrition Focused Physical Exam:  Subcutaneous Fat:  Orbital Region: N/A Upper Arm Region: Moderate depletion Thoracic and Lumbar Region: Severe depletion  Muscle:  Temple Region: Moderate depletion Clavicle Bone Region: Moderate depletion Clavicle and Acromion Bone Region: Moderate depletion Scapular Bone Region: N/A Dorsal Hand: Severe depletion Patellar Region: Severe depletion Anterior Thigh Region: Severe depletion Posterior Calf Region: Severe depletion  Edema: none  Labs: Low calcium, creatinine,  and GFR. High glucose (135 mg/dL) and chloride.  Height: Ht Readings from Last 1 Encounters:  11/23/13 4' 11.84" (1.52 m)    Weight: Wt Readings from Last 1 Encounters:  11/23/13 95 lb 14.4 oz (43.5 kg)    Ideal Body Weight: 100 lbs  % Ideal Body Weight: 95%  Wt Readings from Last 10 Encounters:  11/23/13 95 lb 14.4 oz (43.5 kg)  11/09/13 96 lb (43.545 kg)  10/26/13 103 lb (46.72 kg)  10/13/13 103 lb (46.72 kg)  09/17/13 103 lb (46.72 kg)  08/10/13 101 lb 9.6 oz (46.085 kg)  07/09/13 102 lb (46.267 kg)  05/05/13 102 lb (46.267 kg)  04/30/13 101 lb (45.813 kg)  03/26/13 103 lb (46.72 kg)    Usual Body Weight: 103 lbs  % Usual Body Weight: 92%  BMI:  Body mass index is 18.83 kg/(m^2).  Estimated Nutritional Needs: Kcal: 1300-1500  Protein: 60-70 grams  Fluid: >1.5 L/day  Skin: stage II pressure ulcer on left buttocks, unstageable pressure ulcer on left heel  Diet Order: Dysphagia 3 with nectar thick  EDUCATION NEEDS: -No education needs identified at this time   Intake/Output Summary (Last 24 hours) at 11/24/13 0944 Last data filed at 11/24/13 0106  Gross per 24 hour  Intake   2000 ml  Output      0 ml  Net   2000 ml    Last BM: PTA   Labs:   Recent Labs Lab 11/23/13 2305  NA 148*  K 4.3  CL 114*  CO2 24  BUN 26*  CREATININE 0.61  CALCIUM 8.0*  GLUCOSE 100*    CBG (last  3)  No results found for this basename: GLUCAP,  in the last 72 hours  Scheduled Meds: . acetaminophen  650 mg Oral Q12H  . amLODipine  5 mg Oral Daily  . antiseptic oral rinse  7 mL Mouth Rinse BID  . aspirin EC  325 mg Oral Daily  . calcium-vitamin D  1 tablet Oral BID WC  . ceFEPime (MAXIPIME) IV  1 g Intravenous QHS  . feeding supplement (PRO-STAT SUGAR FREE 64)  30 mL Oral BID  . heparin  5,000 Units Subcutaneous 3 times per day  . levothyroxine  25 mcg Oral QAC breakfast  . multivitamin with minerals  1 tablet Oral Daily  . pantoprazole  40 mg Oral Daily  .  simvastatin  20 mg Oral QPM  . sodium chloride  30 mL/kg Intravenous Once    Continuous Infusions: . sodium chloride 1,000 mL (11/24/13 0309)    Past Medical History  Diagnosis Date  . Mitral insufficiency     CHRONIC  . Hypertension   . Debility     GENERALIZED  . OA (osteoarthritis)   . OP (osteoporosis)   . CHF (congestive heart failure)   . History of diastolic dysfunction   . Muscle weakness (generalized)   . Difficulty walking   . Metabolic encephalopathy   . Hypothyroidism   . HOH (hard of hearing)     "extremely"  . SVT (supraventricular tachycardia)     nonsustained  . Heart murmur   . Repeated falls 2010    "3 serious falls; hit head each time; staples 1st 2 times"  . Vascular dementia dx'd 2011  . Peripheral vascular disease   . Dry cough 03/26/11    "chronic; dx'd years ago as allergy related type of thing"  . Bruises easily   . Chronic back pain greater than 3 months duration   . CVA (cerebrovascular accident) 03/2006; 04/2009    residual "speech problems & short term memory decreased"  . PNA (pneumonia)   . UTI (lower urinary tract infection)   . Hyperlipidemia     Past Surgical History  Procedure Laterality Date  . Removal colon polpys  12/2000    Villous adenoma of the cecum.  . Tonsillectomy and adenoidectomy    . Appendectomy    . Cataract extraction w/ intraocular lens  implant, bilateral  1984  . Dilation and curettage of uterus  1960's    "several"    Kallie Locks, MS, Provisional LDN Pager # [phone] After hours/ weekend pager # [phone]

## 2013-11-24 NOTE — Progress Notes (Addendum)
New Admission Note:   Arrival Method:  Via stretcher Mental Orientation: Alert to person. HOH Telemetry: NSR Assessment: Completed Skin: Stage II left buttocks 1.5cmx2, Necrotic Ulcer left heel IV: NS@125  Pain: denies Tubes: Safety Measures: Safety Fall Prevention Plan has been given, discussed and signed Admission: Completed [address] [place] Orientation: Patient and daughter has been orientated to the room, unit and staff.  Family: Daughter at bedside  Orders have been reviewed and implemented. Will continue to monitor the patient. Call light has been placed within reach and bed alarm has been activated.   Dudley Major BSN, Consulting civil engineer number: [phone]

## 2013-11-24 NOTE — Telephone Encounter (Signed)
Would like for you to call her this morning if possible please. Mother is in the hospital.

## 2013-11-24 NOTE — Progress Notes (Signed)
ANTIBIOTIC CONSULT NOTE - INITIAL  Pharmacy Consult for Cefepime Indication: urosepsis  No Known Allergies  Patient Measurements: Height: 4' 11.84" (152 cm) Weight: 95 lb 14.4 oz (43.5 kg) IBW/kg (Calculated) : 45.14  Vital Signs: Temp: 97.8 F (36.6 C) (09/01 1843) Temp src: Oral (09/01 1843) BP: 129/59 mmHg (09/01 2300) Pulse Rate: 78 (09/01 2300) Intake/Output from previous day:   Intake/Output from this shift:    Labs:  Recent Labs  11/23/13 2305  WBC 9.9  HGB 11.6*  PLT 336  CREATININE 0.61   Estimated Creatinine Clearance: 24.4 ml/min (by C-G formula based on Cr of 0.61). No results found for this basename: VANCOTROUGH, VANCOPEAK, VANCORANDOM, GENTTROUGH, GENTPEAK, GENTRANDOM, TOBRATROUGH, TOBRAPEAK, TOBRARND, AMIKACINPEAK, AMIKACINTROU, AMIKACIN,  in the last 72 hours   Microbiology: No results found for this or any previous visit (from the past 720 hour(s)).  Medical History: Past Medical History  Diagnosis Date  . Mitral insufficiency     CHRONIC  . Hypertension   . Debility     GENERALIZED  . OA (osteoarthritis)   . OP (osteoporosis)   . CHF (congestive heart failure)   . History of diastolic dysfunction   . Muscle weakness (generalized)   . Difficulty walking   . Metabolic encephalopathy   . Hypothyroidism   . HOH (hard of hearing)     "extremely"  . SVT (supraventricular tachycardia)     nonsustained  . Heart murmur   . Repeated falls 2010    "3 serious falls; hit head each time; staples 1st 2 times"  . Vascular dementia dx'd 2011  . Peripheral vascular disease   . Dry cough 03/26/11    "chronic; dx'd years ago as allergy related type of thing"  . Bruises easily   . Chronic back pain greater than 3 months duration   . CVA (cerebrovascular accident) 03/2006; 04/2009    residual "speech problems & short term memory decreased"  . PNA (pneumonia)   . UTI (lower urinary tract infection)   . Hyperlipidemia     Medications:  See electronic  med rec  Assessment: 80 y.o. female presents from NH with not eating or drinking over past few days. Pt with chronic L foot wound. Pt on Keflex PTA. To begin Cefepime for urosepsis.  Goal of Therapy:  Resolution of infection  Plan:  1. Cefepime 1gm IV q24h. 2. Will f/u renal function, pt's clinical condition, micro data  Sherlon Handing, PharmD, BCPS Clinical pharmacist, pager [phone] 11/24/2013,12:25 AM

## 2013-11-24 NOTE — Progress Notes (Signed)
Patient [name]      DOB: 1910-02-01      DGU:[id number]  Spoke with [name]. Will plan to meet 9/3 for [place]  At 3 pm   Franco Duley L. Lovena Le, MD MBA The Palliative Medicine Team at [facility] Phone: [phone] Pager: [phone] ( Use team phone after hours)

## 2013-11-24 NOTE — Consult Note (Addendum)
WOC wound consult note Reason for Consult: Consult requested for left foot.  Pt has chronic wound to left heel which has apparently declined at this time.  Pt has been followed in the past by [facility]VS service and a consult is pending from this team, according to progress notes. ABI results not available. Wound type: Left heel with unstageable wound; 8X6cm.  100% eschar, fluctuant and painful when touched, no drainage, strong foul odor.  Outer left foot with dry stable eschar; 1X.3cm without odor, fluctuance, or drainage. Right heel with stage 1 area of non blanching erythemia; 2X2cm Right inner ankle with stage 1 area of non blanching erythemia; .5X.5cm Pressure Ulcer POA: Yes/No Dressing procedure/placement/frequency: Pt is wearing pressure-reducing heel lift boots bilat.  Topical treatment will  be effective minimally in healing left foot wound at this stage. Foam dressing to protect from further injury. Please defer to VVS team if aggressive plan of care is desired.  Please re-consult if further assistance is needed.  Thank-you,  Julien Girt MSN, [place], [place], [place], [place]

## 2013-11-24 NOTE — Progress Notes (Signed)
80 year old female who presents to ED with daughter due to being more sleepy and not eating or drinking anything over the past couple of days. Patients nurse at her SNF reports that wound on her left foot heel looks significantly worse today with surrounding erythema. She was admitted for UTI and dehydration.   vascular surgery consulted for the left heel ulcer .   Hosie Poisson, MD  [phone]

## 2013-11-24 NOTE — H&P (Signed)
[facility] History and Physical  Julie Frank CZY:[id number] DOB: [birth year] DOA: 11/23/2013  Referring physician: EDP PCP: Hennie Duos, MD   Chief Complaint: Lethargy   HPI: Julie Frank is a 80 y.o. female who presents to ED with daughter due to being more sleepy and not eating or drinking anything over the past couple of days.  Patients nurse at her SNF reports that wound on her left foot heel looks significantly worse today with surrounding erythema.  Daughter thinks it only looks minimally worse.  She has a chronic wound on her left heel for several months stemming from PAD.  She has also been being treated for a CAP in her SNF with [facility] for the past week.  At baseline it sounds like the patient requires total care including feeding, is bed bound, and only somewhat interactive due to her dementia.  Review of Systems: unable to perform due to dementia.  Past Medical History  Diagnosis Date  . Mitral insufficiency     CHRONIC  . Hypertension   . Debility     GENERALIZED  . OA (osteoarthritis)   . OP (osteoporosis)   . CHF (congestive heart failure)   . History of diastolic dysfunction   . Muscle weakness (generalized)   . Difficulty walking   . Metabolic encephalopathy   . Hypothyroidism   . HOH (hard of hearing)     "extremely"  . SVT (supraventricular tachycardia)     nonsustained  . Heart murmur   . Repeated falls 2010    "3 serious falls; hit head each time; staples 1st 2 times"  . Vascular dementia dx'd 2011  . Peripheral vascular disease   . Dry cough 03/26/11    "chronic; dx'd years ago as allergy related type of thing"  . Bruises easily   . Chronic back pain greater than 3 months duration   . CVA (cerebrovascular accident) 03/2006; 04/2009    residual "speech problems & short term memory decreased"  . PNA (pneumonia)   . UTI (lower urinary tract infection)   . Hyperlipidemia    Past Surgical History  Procedure Laterality Date  .  Removal colon polpys  12/2000    Villous adenoma of the cecum.  . Tonsillectomy and adenoidectomy    . Appendectomy    . Cataract extraction w/ intraocular lens  implant, bilateral  1984  . Dilation and curettage of uterus  1960's    "several"   Social History:  reports that she has never smoked. She has never used smokeless tobacco. She reports that she does not drink alcohol or use illicit drugs.  No Known Allergies  Family History  Problem Relation Age of Onset  . Heart disease Mother      Prior to Admission medications   Medication Sig Start Date End Date Taking? Authorizing Provider  acetaminophen (TYLENOL) 325 MG tablet Take 650 mg by mouth every 12 (twelve) hours.   Yes Historical Provider, MD  Amino Acids-Protein Hydrolys (FEEDING SUPPLEMENT, PRO-STAT SUGAR FREE 64,) LIQD Take 30 mLs by mouth 2 (two) times daily.   Yes Historical Provider, MD  amLODipine (NORVASC) 5 MG tablet Take 5 mg by mouth daily.   Yes Historical Provider, MD  aspirin EC 325 MG tablet Take 325 mg by mouth daily.   Yes Historical Provider, MD  Calcium Carb-Cholecalciferol (CALCIUM 600 + D) 600-200 MG-UNIT TABS Take 1 tablet by mouth 2 (two) times daily.    Yes Historical Provider, MD  cephALEXin (KEFLEX) 500  MG capsule Take 500 mg by mouth 2 (two) times daily.   Yes Historical Provider, MD  levothyroxine (SYNTHROID, LEVOTHROID) 25 MCG tablet Take 25 mcg by mouth daily.     Yes Historical Provider, MD  Multiple Vitamin (MULTIVITAMIN) capsule Take 1 capsule by mouth daily.   Yes Historical Provider, MD  omeprazole (PRILOSEC) 20 MG capsule Take 20 mg by mouth daily.   Yes Historical Provider, MD  simvastatin (ZOCOR) 20 MG tablet Take 20 mg by mouth every evening.   Yes Historical Provider, MD  traMADol (ULTRAM) 50 MG tablet Take 50 mg by mouth every 6 (six) hours as needed for moderate pain.   Yes Historical Provider, MD   Physical Exam: Filed Vitals:   11/24/13 0025  BP: 127/57  Pulse: 79  Temp:   Resp:  16    BP 127/57  Pulse 79  Temp(Src) 97.8 F (36.6 C) (Oral)  Resp 16  Ht 4' 11.84" (1.52 m)  Wt 43.5 kg (95 lb 14.4 oz)  BMI 18.83 kg/m2  SpO2 97%  General Appearance:    Lethargic but arouses to voice, no distress, appears stated age  Head:    Normocephalic, atraumatic  Eyes:    PERRL, EOMI, sclera non-icteric        Nose:   Nares without drainage or epistaxis. Mucosa, turbinates normal  Throat:   Moist mucous membranes. Oropharynx without erythema or exudate.  Neck:   Supple. No carotid bruits.  No thyromegaly.  No lymphadenopathy.   Back:     No CVA tenderness, no spinal tenderness  Lungs:     Clear to auscultation bilaterally, without wheezes, rhonchi or rales  Chest wall:    No tenderness to palpitation  Heart:    Regular rate and rhythm without murmurs, gallops, rubs  Abdomen:     Soft, non-tender, nondistended, normal bowel sounds, no organomegaly  Genitalia:    deferred  Rectal:    deferred  Extremities:   No clubbing, cyanosis or edema.  Pulses:   2+ and symmetric all extremities  Skin:   Ischemic ulcer of heel on left foot with surrounding cellulitis and overlying eschar.  Lymph nodes:   Cervical, supraclavicular, and axillary nodes normal  Neurologic:   CNII-XII intact. Normal strength, sensation and reflexes      throughout    Labs on Admission:  Basic Metabolic Panel:  Recent Labs Lab 11/23/13 2305  NA 148*  K 4.3  CL 114*  CO2 24  GLUCOSE 100*  BUN 26*  CREATININE 0.61  CALCIUM 8.0*   Liver Function Tests:  Recent Labs Lab 11/23/13 2305  AST 21  ALT 12  ALKPHOS 70  BILITOT 0.4  PROT 5.9*  ALBUMIN 2.3*   No results found for this basename: LIPASE, AMYLASE,  in the last 168 hours No results found for this basename: AMMONIA,  in the last 168 hours CBC:  Recent Labs Lab 11/23/13 2305  WBC 9.9  NEUTROABS 7.7  HGB 11.6*  HCT 37.2  MCV 92.8  PLT 336   Cardiac Enzymes:  Recent Labs Lab 11/23/13 2305  CKTOTAL 36    BNP (last 3  results)  Recent Labs  12/04/12 2039 12/05/12 0519  PROBNP 14051.0* 14473.0*   CBG: No results found for this basename: GLUCAP,  in the last 168 hours  Radiological Exams on Admission: Dg Chest Port 1 View  11/23/2013   CLINICAL DATA:  Weakness, lethargic.  EXAM: PORTABLE CHEST - 1 VIEW  COMPARISON:  12/04/2012  FINDINGS:  Aortic atherosclerosis and prominence. Cardiomegaly. Small effusions and bibasilar opacities, similar to prior. Diffuse osteopenia. Multilevel degenerative changes and osteopenia  IMPRESSION: Small effusions and bibasilar opacities ; atelectasis versus infiltrate.  Prominent cardiomediastinal contours, similar to prior.   Electronically Signed   By: Carlos Levering M.D.   On: 11/23/2013 22:53   Dg Foot Complete Left  11/23/2013   CLINICAL DATA:  Left great toe wound.  EXAM: LEFT FOOT - COMPLETE 3+ VIEW  COMPARISON:  None.  FINDINGS: Diffuse osteopenia. No displaced acute fracture or dislocation. No aggressive osseous lesion. Atherosclerotic vascular calcifications. Soft tissue irregularity overlies the calcaneus.  IMPRESSION: Diffuse osteopenia.  No acute osseous finding.  Soft tissue wound or ulceration overlies the calcaneus.  Consider MRI if concern for acute osteomyelitis persists.   Electronically Signed   By: Carlos Levering M.D.   On: 11/23/2013 22:56    EKG: Independently reviewed.  Assessment/Plan Principal Problem:   Altered mental status Active Problems:   Troponin I above reference range   UTI (lower urinary tract infection)   PAD (peripheral artery disease)   Heel ulcer   1. AMS secondary to UTI - treating UTI with cefepime, cultures pending, IVF at 125 cc/hr overnight to treat dehydration. 2. Ischemic heel ulcer due to PAD - wound care consult ordered, this is due to severe PAD, patients daughter had made apointment with Dr. Trula Slade to discuss options at this point.  On Cefepime as mentioned above.  Consult Dr. Trula Slade if family still wants to pursue  surgical options after discussion with palliative care (consult placed) tomorrow, but given her essentially non-existant QOL at baseline, her extremely advanced age, and her DNR wishes that she told daughter long ago.  Im not so sure continuing to take an aggressive approach to treatment is best or even what the patient would want if she were able to make decisions for herself. 3. Troponin I elevation - will continue to trend, looks like she may have chronically mildly elevated troponin's from her history in chart.    Code Status: DNR Family Communication: Daughter at bedside Disposition Plan: Admit to inpatient   Time spent: 46 min  GARDNER, JARED M. [facility] Pager [phone]  If 7AM-7PM, please contact the day team taking care of the patient [email or web address] Password [facility] 11/24/2013, 12:54 AM

## 2013-11-24 NOTE — Progress Notes (Addendum)
Patient Julie Frank      DOB: Mar 29, 1909      LKH:[id number]  PMT consult received. Daughter not at bedside.  Spoke with nursing. Dtr has requested vascular consult.  Will call later this am to schedule meeting with daughter for after vascular consult so that we can discuss goals with more information.    Rosilyn Coachman L. Lovena Le, MD MBA The Palliative Medicine Team at [facility] Phone: [phone] Pager: [phone] ( Use team phone after hours)

## 2013-11-24 NOTE — Telephone Encounter (Signed)
Returned call to patient's daughter [name] she stated her mother is in [place].Stated she was admitted 11/23/13 with UTI and dehydration.Stated ulcer on left foot is worse.Dr.Brabham will be seeing her today.Stated she wanted to ask Dr.Jordan if her heart was strong enough to have surgery.Dr.Jordan advised she is high risk due to her age.

## 2013-11-24 NOTE — Consult Note (Addendum)
[facility] [place]  Reason for Consult:  Left heel wound Referring Physician:  Alcario Drought MRN #:  [id number]  History of Present Illness: This is a 80 y.o. female with PMH of dementia and PAD who was admitted to the hospital for altered mental status secondary to UTI. She has also been treated for CAP over the past week in her SNF. The patient is an unreliable historian due to her mental status and she also very hard of hearing. We have been consulted regarding a left heel wound that has been present for several months due to PAD. The patient denies any pain. The family wants to discuss possible options. They have also requested a palliative care consult and will discuss goals of care tomorrow. The patient likely requires total care and is bed bound, but this is uncertain. When asked whether she ambulates or is bed bound, she replied "no" to both.   ROS unable to be performed due to dementia.   She has hypertension managed with norvasc. She has hypercholesterolemia managed with a statin. She takes a daily aspirin. She is not diabetic.   Past Medical History  Diagnosis Date  . Mitral insufficiency     CHRONIC  . Hypertension   . Debility     GENERALIZED  . OA (osteoarthritis)   . OP (osteoporosis)   . CHF (congestive heart failure)   . History of diastolic dysfunction   . Muscle weakness (generalized)   . Difficulty walking   . Metabolic encephalopathy   . Hypothyroidism   . HOH (hard of hearing)     "extremely"  . SVT (supraventricular tachycardia)     nonsustained  . Heart murmur   . Repeated falls 2010    "3 serious falls; hit head each time; staples 1st 2 times"  . Vascular dementia dx'd 2011  . Peripheral vascular disease   . Dry cough 03/26/11    "chronic; dx'd years ago as allergy related type of thing"  . Bruises easily   . Chronic back pain greater than 3 months duration   . CVA (cerebrovascular accident) 03/2006; 04/2009    residual "speech  problems & short term memory decreased"  . PNA (pneumonia)   . UTI (lower urinary tract infection)   . Hyperlipidemia    Past Surgical History  Procedure Laterality Date  . Removal colon polpys  12/2000    Villous adenoma of the cecum.  . Tonsillectomy and adenoidectomy    . Appendectomy    . Cataract extraction w/ intraocular lens  implant, bilateral  1984  . Dilation and curettage of uterus  1960's    "several"    No Known Allergies  Prior to Admission medications   Medication Sig Start Date End Date Taking? Authorizing Provider  acetaminophen (TYLENOL) 325 MG tablet Take 650 mg by mouth every 12 (twelve) hours.   Yes Historical Provider, MD  Amino Acids-Protein Hydrolys (FEEDING SUPPLEMENT, PRO-STAT SUGAR FREE 64,) LIQD Take 30 mLs by mouth 2 (two) times daily.   Yes Historical Provider, MD  amLODipine (NORVASC) 5 MG tablet Take 5 mg by mouth daily.   Yes Historical Provider, MD  aspirin EC 325 MG tablet Take 325 mg by mouth daily.   Yes Historical Provider, MD  Calcium Carb-Cholecalciferol (CALCIUM 600 + D) 600-200 MG-UNIT TABS Take 1 tablet by mouth 2 (two) times daily.    Yes Historical Provider, MD  cephALEXin (KEFLEX) 500 MG capsule Take 500 mg by mouth 2 (two) times daily.  Yes Historical Provider, MD  levothyroxine (SYNTHROID, LEVOTHROID) 25 MCG tablet Take 25 mcg by mouth daily.     Yes Historical Provider, MD  Multiple Vitamin (MULTIVITAMIN) capsule Take 1 capsule by mouth daily.   Yes Historical Provider, MD  omeprazole (PRILOSEC) 20 MG capsule Take 20 mg by mouth daily.   Yes Historical Provider, MD  simvastatin (ZOCOR) 20 MG tablet Take 20 mg by mouth every evening.   Yes Historical Provider, MD  traMADol (ULTRAM) 50 MG tablet Take 50 mg by mouth every 6 (six) hours as needed for moderate pain.   Yes Historical Provider, MD    History   Social History  . Marital Status: Single    Spouse Name: N/A    Number of Children: 1  . Years of Education: N/A    Occupational History  . Network engineer     retired   Social History Main Topics  . Smoking status: Never Smoker   . Smokeless tobacco: Never Used  . Alcohol Use: No  . Drug Use: No  . Sexual Activity: Not Currently   Other Topics Concern  . Not on file   Social History Narrative  . No narrative on file   Family History  Problem Relation Age of Onset  . Heart disease Mother     Physical Examination  Filed Vitals:   11/24/13 0930  BP: 117/73  Pulse: 81  Temp: 97.5 F (36.4 C)  Resp: 18   Body mass index is 18.83 kg/(m^2).  General:  Pleasantly demented ill-appearing female in NAD Gait: Not observed HENT: WNL, normocephalic Eyes: Pupils equal Pulmonary: normal non-labored breathing, without Rales, rhonchi,  wheezing Cardiac: regular, without  Murmurs, rubs or gallops; without carotid bruits Abdomen: soft, NT/ND, no masses Skin: left heel with large 8 x 6 cm wound with dry black eschar. No drainage seen with foul odor. Small necrotic area on lateral aspect of left 5th metatarsal.  Vascular Exam/Pulses:  Right Left  Radial 2+ (normal) 2+ (normal)  Ulnar 2+ (normal) 2+ (normal)  Femoral 2+ (normal) 2+ (normal)  Popliteal Non palpable  Non palpable   DP Non palpable  Non palpable   PT Non palpable  Non palpable    Extremities: No cellulitis seen. No edema.  Musculoskeletal: diffuse muscle wasting and atrophy.  Neurologic: A&O to person only; Appropriate Affect ; SENSATION: normal; MOTOR FUNCTION:  moving all extremities equally.    CBC    Component Value Date/Time   WBC 9.9 11/23/2013 2305   RBC 4.01 11/23/2013 2305   HGB 11.6* 11/23/2013 2305   HCT 37.2 11/23/2013 2305   PLT 336 11/23/2013 2305   MCV 92.8 11/23/2013 2305   MCH 28.9 11/23/2013 2305   MCHC 31.2 11/23/2013 2305   RDW 15.4 11/23/2013 2305   LYMPHSABS 1.1 11/23/2013 2305   MONOABS 0.9 11/23/2013 2305   EOSABS 0.1 11/23/2013 2305   BASOSABS 0.0 11/23/2013 2305    BMET    Component Value Date/Time   NA 148*  11/23/2013 2305   K 4.3 11/23/2013 2305   CL 114* 11/23/2013 2305   CO2 24 11/23/2013 2305   GLUCOSE 100* 11/23/2013 2305   BUN 26* 11/23/2013 2305   CREATININE 0.61 11/23/2013 2305   CREATININE 0.67 10/13/2013 1455   CALCIUM 8.0* 11/23/2013 2305   GFRNONAA 71* 11/23/2013 2305   GFRAA 82* 11/23/2013 2305    COAGS: Lab Results  Component Value Date   INR 1.19 10/27/2013   INR 1.2* 10/26/2013   INR 1.43  12/05/2012     ASSESSMENT: This is a 80 y.o. female with ischemic left heel ulcer secondary to PAD  PLAN: Due to location of the wound, any revascularization attempts would not likely heal the wound. Her only option  would be above the knee amputation. Given her age, DNR wishes and baseline quality of life, family will need to discuss aggressive approach versus conservative approach with comfort care and local wound care. The patient denies any pain however. We will await family decisions.   Virgina Jock, PA-C [facility] Office: [phone] Pager: [phone]  I have examined the patient, reviewed and agree with above. In reviewing the chart, the patient has had a recent arteriogram with Dr.Arida. I did review this showing severe SFA and tibial occlusive disease.  Due to the extent of tissue loss in her heel, there is no possibility for limb salvage. The patient is nonambulatory. Extensive necrosisat her heel undoubtedly is down to the calcaneus. Fortunately the patient is not having any pain associated with this. There is no evidence of invasive infection. Does have a dry eschar. Only option would be for above knee amputation. We'll follow as family makes his decision  Zannie Locastro, MD 11/24/2013 3:11 PM

## 2013-11-24 NOTE — ED Notes (Addendum)
Report given to [facility] unit nurse , transported in stable condition , iv site intact/foley catheter intact . No personal belongings , pt. arrived from nursing home wearing hospital gown .

## 2013-11-25 DIAGNOSIS — R234 Changes in skin texture: Secondary | ICD-10-CM

## 2013-11-25 DIAGNOSIS — R404 Transient alteration of awareness: Secondary | ICD-10-CM

## 2013-11-25 DIAGNOSIS — Z515 Encounter for palliative care: Secondary | ICD-10-CM

## 2013-11-25 NOTE — Progress Notes (Signed)
Note/chart reviewed.  Katie Vonna Brabson, RD, LDN Pager #: [phone] After-Hours Pager #: [phone]  

## 2013-11-25 NOTE — Progress Notes (Signed)
[facility] PROGRESS NOTE  [name] QAS:[id number] DOB: 03-28-1909 DOA: 11/23/2013 PCP: Hennie Duos, MD Interim summary: Julie Frank is a 80 y.o. female who presents to ED with daughter due to being more sleepy and not eating or drinking anything over the past couple of days. Patients nurse at her SNF reports that wound on her left foot heel looks significantly worse today with surrounding erythema. She was admitted to the hospital for evaluation of dehydration, UTI and acute encephalopathy secondary to the above.   Assessment/Plan: Acute encephalopathy : possibly metabolic from hypernatremia, dehdyration and UTI. She appears to be back to her baseline, she is alert and is eating a little.    Urinary tract infection: Cultures are pending and on cefepime.    Severe PAD with left heel ulcer chronic: Vascular surgery consulted and she will need above knee amputation if the family wants surgery. Currently family is talking with palliative care physicians for goals of care .   Hypernatremia and dehydration: resolved.   Mildly elevated point of care troponin: The troponin levels follow ing are all negative. She denies any chest pain.     Code Status: DNR Family Communication: none at bedside Disposition Plan: pending.    Consultants: Orthopedics  Palliative care consult. Procedures:  none  Antibiotics:  IV cefepime 9/2  HPI/Subjective: Denies any new complaints, wants to sit up. No family at bedside  Objective: Filed Vitals:   11/25/13 0908  BP: 127/44  Pulse: 77  Temp: 97.8 F (36.6 C)  Resp: 18    Intake/Output Summary (Last 24 hours) at 11/25/13 1509 Last data filed at 11/25/13 1345  Gross per 24 hour  Intake 2297.5 ml  Output   1500 ml  Net  797.5 ml   Filed Weights   11/23/13 2245  Weight: 43.5 kg (95 lb 14.4 oz)    Exam:   General:  Alert and pleasantly confused,   Cardiovascular: s1s2  Respiratory: diminished air  entry at bases, no wheezing or rhonchi  Abdomen: soft non tender non distended bowel sounds heard  Musculoskeletal: no pedal edema, left heel with 8 cm area wound with dry black eschar, with foul odor.   Data Reviewed: Basic Metabolic Panel:  Recent Labs Lab 11/23/13 2305 11/24/13 1307  NA 148* 145  K 4.3 3.8  CL 114* 115*  CO2 24 20  GLUCOSE 100* 135*  BUN 26* 21  CREATININE 0.61 0.48*  CALCIUM 8.0* 7.5*   Liver Function Tests:  Recent Labs Lab 11/23/13 2305  AST 21  ALT 12  ALKPHOS 70  BILITOT 0.4  PROT 5.9*  ALBUMIN 2.3*   No results found for this basename: LIPASE, AMYLASE,  in the last 168 hours No results found for this basename: AMMONIA,  in the last 168 hours CBC:  Recent Labs Lab 11/23/13 2305  WBC 9.9  NEUTROABS 7.7  HGB 11.6*  HCT 37.2  MCV 92.8  PLT 336   Cardiac Enzymes:  Recent Labs Lab 11/23/13 2305 11/24/13 0045 11/24/13 0809 11/24/13 1159  CKTOTAL 36  --   --   --   TROPONINI  --  <0.30 <0.30 <0.30   BNP (last 3 results)  Recent Labs  12/04/12 2039 12/05/12 0519  PROBNP 14051.0* 14473.0*   CBG: No results found for this basename: GLUCAP,  in the last 168 hours  Recent Results (from the past 240 hour(s))  URINE CULTURE     Status: None   Collection Time    11/23/13  10:45 PM      Result Value Ref Range Status   Specimen Description URINE, CATHETERIZED   Final   Special Requests NONE   Final   Culture  Setup Time     Final   Value: 11/23/2013 02:00     Performed at [facility]     Final   Value: >=100,000 COLONIES/ML     Performed at [facility]   Culture     Final   Value: [place]     Performed at [facility]   Report Status PENDING   Incomplete  CULTURE, BLOOD (ROUTINE X 2)     Status: None   Collection Time    11/24/13 12:42 AM      Result Value Ref Range Status   Specimen Description BLOOD LEFT WRIST   Final   Special Requests BOTTLES DRAWN AEROBIC ONLY  10CC   Final   Culture  Setup Time     Final   Value: 11/24/2013 08:24     Performed at [facility]   Culture     Final   Value:        BLOOD CULTURE RECEIVED NO GROWTH TO DATE CULTURE WILL BE HELD FOR 5 DAYS BEFORE ISSUING A FINAL NEGATIVE REPORT     Performed at [facility]   Report Status PENDING   Incomplete  CULTURE, BLOOD (ROUTINE X 2)     Status: None   Collection Time    11/24/13 12:45 AM      Result Value Ref Range Status   Specimen Description BLOOD RIGHT ARM   Final   Special Requests BOTTLES DRAWN AEROBIC AND ANAEROBIC 6CC   Final   Culture  Setup Time     Final   Value: 11/24/2013 08:25     Performed at [facility]   Culture     Final   Value:        BLOOD CULTURE RECEIVED NO GROWTH TO DATE CULTURE WILL BE HELD FOR 5 DAYS BEFORE ISSUING A FINAL NEGATIVE REPORT     Performed at [facility]   Report Status PENDING   Incomplete     Studies: Dg Chest Port 1 View  11/23/2013   CLINICAL DATA:  Weakness, lethargic.  EXAM: PORTABLE CHEST - 1 VIEW  COMPARISON:  12/04/2012  FINDINGS: Aortic atherosclerosis and prominence. Cardiomegaly. Small effusions and bibasilar opacities, similar to prior. Diffuse osteopenia. Multilevel degenerative changes and osteopenia  IMPRESSION: Small effusions and bibasilar opacities ; atelectasis versus infiltrate.  Prominent cardiomediastinal contours, similar to prior.   Electronically Signed   By: Carlos Levering M.D.   On: 11/23/2013 22:53   Dg Foot Complete Left  11/23/2013   CLINICAL DATA:  Left great toe wound.  EXAM: LEFT FOOT - COMPLETE 3+ VIEW  COMPARISON:  None.  FINDINGS: Diffuse osteopenia. No displaced acute fracture or dislocation. No aggressive osseous lesion. Atherosclerotic vascular calcifications. Soft tissue irregularity overlies the calcaneus.  IMPRESSION: Diffuse osteopenia.  No acute osseous finding.  Soft tissue wound or ulceration overlies the calcaneus.  Consider MRI if concern for acute  osteomyelitis persists.   Electronically Signed   By: Carlos Levering M.D.   On: 11/23/2013 22:56    Scheduled Meds: . acetaminophen  650 mg Oral Q12H  . amLODipine  5 mg Oral Daily  . antiseptic oral rinse  7 mL Mouth Rinse BID  . aspirin EC  325 mg Oral Daily  .  calcium-vitamin D  1 tablet Oral BID WC  . ceFEPime (MAXIPIME) IV  1 g Intravenous QHS  . feeding supplement (ENSURE COMPLETE)  237 mL Oral BID BM  . feeding supplement (PRO-STAT SUGAR FREE 64)  30 mL Oral BID  . heparin  5,000 Units Subcutaneous 3 times per day  . levothyroxine  25 mcg Oral QAC breakfast  . multivitamin with minerals  1 tablet Oral Daily  . pantoprazole  40 mg Oral Daily  . simvastatin  20 mg Oral QPM  . sodium chloride  30 mL/kg Intravenous Once   Continuous Infusions: . sodium chloride 1,000 mL (11/25/13 0400)    Principal Problem:   Altered mental status Active Problems:   Troponin I above reference range   UTI (lower urinary tract infection)   PAD (peripheral artery disease)   Heel ulcer   Protein-calorie malnutrition, severe    Time spent: 25 minutes    Keitra Carusone  [facility] Pager [phone] If 7PM-7AM, please contact night-coverage at [email or web address], password [facility] 11/25/2013, 3:09 PM  LOS: 2 days

## 2013-11-25 NOTE — Progress Notes (Signed)
Patient ID: Julie Frank, female   DOB: April 17, 1909, 80 y.o.   MRN: [id number] Following a long primary service. Palliative care or discussion today regarding goals of care with daughter. Only surgical option would be above-knee amputation. Fortunately patient denies pain and has no invasive infection from heel wd

## 2013-11-25 NOTE — Consult Note (Signed)
Patient VV:ZSMOLMBE R Cariker      DOB: 1910/03/24      MLJ:[id number]   Met from 340 pm to 621 pm with patients daughter and subsequently the patient. Daughter [name] is under a lot of external stress at this time.  It is difficult for her to focus on just one issue. [name] walked me through her parents life together and then shared her history with them since 29. The patient has been failing recently but with proper care generally rallies and thrives. [name] is a burnt out care giver who doesn't want to make the wrong choices for her mom and she admits she is not ready to give her up yet.  I tried to bring her perspective to her mothers wishes for a natural and peaceful death.  At this time [name] acknowledges that she honors her mom's DNR status but she wants to treat the treatable as long as it won't promote suffering.  She admits that recent efforts to promote wound care have caused suffering as the patient has experienced great pain during debridement sessions at the wound care center.  [name] remains torn about the idea that if curative treatment is sought then an AKA would need to be done. [name] reports she was hoping she would be eligible for bypass or a stent.  At present, [name] understands  the limited options for healing her mothers foot.  She initial expressed grave fear that we were "at the time for hospice" which she equated to her friends mother's case. Evidently the patient became septic after surgery and transitioned rapidly to [facility] place and died shortly thereafter. i was able to start to help [name] see that if she decided on conservative therapy she could consider having hospice help her watchover her mom at the SNF.  Norberta Keens would consider this arrangement.  She is not completely in favor of full comfort at this time ( which she equates with ust throwing her mom away).   Recommend  1. DNR  2. Continue local aggressive wound care and abx. [name] may need to ask more  questions of vascular hefore being completely satisfied with choosing a comfort care path  3.severe malnutrition agree with supplements. supported feeding  4. UTI follow up cultures Nd adjust abx accordingly. 5. Pain controlled. Ok to use ultram.   Crystol Walpole L. Lovena Le, MD MBA The Palliative Medicine Team at [facility] Phone: [phone] Pager: [phone] ( Use team phone after hours)

## 2013-11-25 NOTE — Clinical Documentation Improvement (Signed)
  Clinical Documentation Clarification #1 "AMS"  H&P states "AMS secondary to UTI". In the Coding world "AMS" is considered nonspecific and low weighted. If possible please clarify this term to better reflect severity of illness and risk of mortality.  Possible Conditions - Metabolic encephalopathy - Toxic encephalopathy - Altered mental status - Other condition  Clinical Documentation Clarification #2 "total care bed bound"  SNF resident requiring total care unable to care for self and bed bound. Please clarify this status to illustrate severity of illness and risk of mortality.  Possible Conditions - Functional quadriplegia - Other condition  Thank You,  Barrie Dunker RN BSN [facility] [phone] Health Information Management

## 2013-11-25 NOTE — Consult Note (Signed)
Patient Julie Frank      DOB: 18-Dec-1909      PPI:[id number]     Consult Note from the Palliative Medicine Team at [facility] Requested by:  Dr. Karleen Hampshire     PCP: Hennie Duos, MD Reason for Consultation: GOals of care     Phone Number:[phone] Related symptom recommendation Assessment of patients Current state: 80 yr old white female with advanced dementia, and PAD admitted with altered mental status.  Daughter states the staff at her care facility could not wake her up.  Patient was admitted and found to have UTI and worsening wound infection.  Patient was responding to treatment at the time of the interview in that she was more awake and alert but pleasantly confused which is her baseline.  I spent significant amount of time with the patient's daughter who is under a lot of pressure to care for her mother. She recounted their history together and explored her current thoughts on the level a care that she desires .  We reviewed the Vascular surgery opinion and she remains conflicted about potentially not considering a surgical option even though she understands that her mother is frail and likely would not do well with surgery.  At this time, she desires aggressive treatment of the wound and UTI with reevaluation on Monday.  Patient is miraculously asymptomatic related to pain.   Goals of Care: 1.  Code Status: DNR   2. Scope of Treatment: Continue aggressive treatment of wound and UTI  4. Disposition: Daughter has given up her bed at [facility] and will be open to return vs new setting for her  Mother's care.   3. Symptom Management:   1. Pain: concur with Ultram prn  2. Bowel Regimen: monitor 3. Delirium: related to infection /encepalopathy resolving 4. Dysphagia- continue modified diet.  4. 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  She has a son and daughter- Josph Macho and [name]. Widowed.  5. Spiritual: Christian  faith.   Patient Documents Completed or Given: Document Given Completed  Advanced Directives Pkt    MOST    DNR    Gone from My Sight    Hard Choices      Brief HPI: 80 yr old white female with history of dementia. Presented with altered mental status found to have wound infection and UTI, SIRS. We were asked to assist with Goals of care.   ROS: Denies pain, nausea, vomiting    PMH:  Past Medical History  Diagnosis Date  . Mitral insufficiency     CHRONIC  . Hypertension   . Debility     GENERALIZED  . OA (osteoarthritis)   . OP (osteoporosis)   . CHF (congestive heart failure)   . History of diastolic dysfunction   . Muscle weakness (generalized)   . Difficulty walking   . Metabolic encephalopathy   . Hypothyroidism   . HOH (hard of hearing)     "extremely"  . SVT (supraventricular tachycardia)     nonsustained  . Heart murmur   . Repeated falls 2010    "3 serious falls; hit head each time; staples 1st 2 times"  . Vascular dementia dx'd 2011  . Peripheral vascular disease   . Dry cough 03/26/11    "chronic; dx'd years ago as allergy related type of thing"  . Bruises easily   . Chronic back pain greater than 3 months duration   . CVA (cerebrovascular accident) 03/2006; 04/2009  residual "speech problems & short term memory decreased"  . PNA (pneumonia)   . UTI (lower urinary tract infection)   . Hyperlipidemia      PSH: Past Surgical History  Procedure Laterality Date  . Removal colon polpys  12/2000    Villous adenoma of the cecum.  . Tonsillectomy and adenoidectomy    . Appendectomy    . Cataract extraction w/ intraocular lens  implant, bilateral  1984  . Dilation and curettage of uterus  1960's    "several"   I have reviewed the [place] and SH and  If appropriate update it with new information. No Known Allergies Scheduled Meds: . acetaminophen  650 mg Oral Q12H  . amLODipine  5 mg Oral Daily  . antiseptic oral rinse  7 mL Mouth Rinse BID  .  aspirin EC  325 mg Oral Daily  . calcium-vitamin D  1 tablet Oral BID WC  . ceFEPime (MAXIPIME) IV  1 g Intravenous QHS  . feeding supplement (ENSURE COMPLETE)  237 mL Oral BID BM  . feeding supplement (PRO-STAT SUGAR FREE 64)  30 mL Oral BID  . heparin  5,000 Units Subcutaneous 3 times per day  . levothyroxine  25 mcg Oral QAC breakfast  . multivitamin with minerals  1 tablet Oral Daily  . pantoprazole  40 mg Oral Daily  . simvastatin  20 mg Oral QPM  . sodium chloride  30 mL/kg Intravenous Once   Continuous Infusions: . sodium chloride 1,000 mL (11/25/13 2011)   PRN Meds:.RESOURCE THICKENUP CLEAR, traMADol    BP 121/58  Pulse 74  Temp(Src) 98.7 F (37.1 C) (Oral)  Resp 19  Ht 4' 11.84" (1.52 m)  Wt 43.5 kg (95 lb 14.4 oz)  BMI 18.83 kg/m2  SpO2 97%   PPS: 30-40   Intake/Output Summary (Last 24 hours) at 11/25/13 2120 Last data filed at 11/25/13 1841  Gross per 24 hour  Intake 2237.5 ml  Output    900 ml  Net 1337.5 ml   LBM:9/3  Physical Exam:  General: Awakens easily in no acute distress, pleasantly confuese HEENT:  PERRL,EOMI, anicteric, mmm Chest:   Decreased with no rhonchi, rales or wheezes CVS: distant, regular , S1, S2  Abdomen:soft, not tender or distended,  Ext: left foot wounds dressed foul smellling Neuro:awake, alert but not oriented to time place oriented to duaghter  Labs: CBC    Component Value Date/Time   WBC 9.9 11/23/2013 2305   RBC 4.01 11/23/2013 2305   HGB 11.6* 11/23/2013 2305   HCT 37.2 11/23/2013 2305   PLT 336 11/23/2013 2305   MCV 92.8 11/23/2013 2305   MCH 28.9 11/23/2013 2305   MCHC 31.2 11/23/2013 2305   RDW 15.4 11/23/2013 2305   LYMPHSABS 1.1 11/23/2013 2305   MONOABS 0.9 11/23/2013 2305   EOSABS 0.1 11/23/2013 2305   BASOSABS 0.0 11/23/2013 2305     CMP     Component Value Date/Time   NA 145 11/24/2013 1307   K 3.8 11/24/2013 1307   CL 115* 11/24/2013 1307   CO2 20 11/24/2013 1307   GLUCOSE 135* 11/24/2013 1307   BUN 21 11/24/2013 1307    CREATININE 0.48* 11/24/2013 1307   CREATININE 0.67 10/13/2013 1455   CALCIUM 7.5* 11/24/2013 1307   PROT 5.9* 11/23/2013 2305   ALBUMIN 2.3* 11/23/2013 2305   AST 21 11/23/2013 2305   ALT 12 11/23/2013 2305   ALKPHOS 70 11/23/2013 2305   BILITOT 0.4 11/23/2013 2305   GFRNONAA  77* 11/24/2013 1307   GFRAA 89* 11/24/2013 1307    Chest Xray Reviewed/Impressions:Small effusions and bibasilar opacities ; atelectasis versus  infiltrate.  Prominent cardiomediastinal contours, similar to prior.    Xray foot: Diffuse osteopenia. No acute osseous finding.  Soft tissue wound or ulceration overlies the calcaneus.  Consider MRI if concern for acute osteomyelitis persists.      Time In Time Out Total Time Spent with Patient Total Overall Time  340 pm  621 pm 30 min 159 min    Greater than 50%  of this time was spent counseling and coordinating care related to the above assessment and plan.   Davionne Mastrangelo L. Lovena Le, MD MBA The Palliative Medicine Team at [facility] Phone: [phone] Pager: [phone] ( Use team phone after hours)

## 2013-11-26 ENCOUNTER — Inpatient Hospital Stay (HOSPITAL_COMMUNITY): Payer: Medicare Other

## 2013-11-26 LAB — BASIC METABOLIC PANEL
Anion gap: 11 (ref 5–15)
BUN: 19 mg/dL (ref 6–23)
CO2: 19 mEq/L (ref 19–32)
Calcium: 7.7 mg/dL — ABNORMAL LOW (ref 8.4–10.5)
Chloride: 118 mEq/L — ABNORMAL HIGH (ref 96–112)
Creatinine, Ser: 0.45 mg/dL — ABNORMAL LOW (ref 0.50–1.10)
GFR calc Af Amer: >90 mL/min (ref >90)
GFR, EST NON AFRICAN AMERICAN: 78 mL/min — AB (ref >90)
Glucose, Bld: 103 mg/dL — ABNORMAL HIGH (ref 70–99)
Potassium: 3.7 mEq/L (ref 3.7–5.3)
Sodium: 148 mEq/L — ABNORMAL HIGH (ref 137–147)

## 2013-11-26 LAB — CBC
HEMATOCRIT: 34.1 % — AB (ref 36.0–46.0)
Hemoglobin: 10.5 g/dL — ABNORMAL LOW (ref 12.0–15.0)
MCH: 28 pg (ref 26.0–34.0)
MCHC: 30.8 g/dL (ref 30.0–36.0)
MCV: 90.9 fL (ref 78.0–100.0)
Platelets: 371 10*3/uL (ref 150–400)
RBC: 3.75 MIL/uL — ABNORMAL LOW (ref 3.87–5.11)
RDW: 15.9 % — ABNORMAL HIGH (ref 11.5–15.5)
WBC: 11.5 10*3/uL — ABNORMAL HIGH (ref 4.0–10.5)

## 2013-11-26 LAB — URINE CULTURE: Colony Count: >=100000

## 2013-11-26 MED ORDER — CEPHALEXIN 500 MG PO CAPS
500.0000 mg | ORAL_CAPSULE | Freq: Two times a day (BID) | ORAL | Status: DC
Start: 2013-11-26 — End: 2013-12-01
  Administered 2013-11-26 – 2013-12-01 (×11): 500 mg via ORAL
  Filled 2013-11-26 (×13): qty 1

## 2013-11-26 NOTE — Clinical Social Work Note (Signed)
CSW spoke with patient's daughter Meyer Dockery ([phone]) by phone regarding discharge planning to determine if patient will return to [facility]. Daughter indicated that she does not know at this time and wants to talk with CSW concerning this. Daughter will not be at hospital today, 9/4 until after 5 pm, so wants to speak with CSW regarding discharge plans for her mother on Monday.  Nox Talent Givens, MSW, LCSW [phone]

## 2013-11-26 NOTE — Progress Notes (Signed)
Spoke with pt daughter by phone today for approximately 15 minutes.  She was inquiring about hyperbaric wound therapy as an option.  She also asked for recommendations on local wound care.    I told her that I did not believe that hyperbaric therapy would have a role in this extensive wound but would not be harmful.  I also advised her there is limited data on hyperbaric therapy for healing these types of wounds.  I also told her that I thought serial debridement and Santyl would not be necessary that the best scenario is to let the would dry out with and dry dressing and protect it and hope for the best.  Daughter does not wish to proceed with operation at this point.    Will sign off.    Ruta Hinds, MD [facility] Office: [phone] Pager: [phone]

## 2013-11-26 NOTE — Care Management Note (Signed)
CARE MANAGEMENT NOTE 11/26/2013  Patient:  Julie Frank, Julie Frank   Account Number:  [id number]  Date Initiated:  11/26/2013  Documentation initiated by:  Minsa Weddington  Subjective/Objective Assessment:   CM following for progression and d/c planning.     Action/Plan:   Pt is SNF resident, will return to SNF level of care.   Anticipated DC Date:     Anticipated DC Plan:  SKILLED NURSING FACILITY         Choice offered to / List presented to:             Status of service:   Medicare Important Message given?  YES (If response is "NO", the following Medicare IM given date fields will be blank) Date Medicare IM given:  11/26/2013 Medicare IM given by:  Ariba Lehnen Date Additional Medicare IM given:   Additional Medicare IM given by:    Discharge Disposition:    Per UR Regulation:    If discussed at Long Length of Stay Meetings, dates discussed:    Comments:

## 2013-11-26 NOTE — Clinical Documentation Improvement (Signed)
  SNF resident requiring total care unable to care for self and bed bound. Please clarify this status to illustrate severity of illness and risk of mortality.  Possible Conditions - Functional quadriplegia - Other condition  Thank You, Ezekiel Ina ,RN Clinical Documentation Specialist:  [phone]  [facility] Information Management

## 2013-11-26 NOTE — Progress Notes (Signed)
[facility] PROGRESS NOTE  Julie Frank GGY:[id number] DOB: 1910/02/17 DOA: 11/23/2013 PCP: Hennie Duos, MD Interim summary: Julie Frank is a 80 y.o. female who presents to ED with daughter due to being more sleepy and not eating or drinking anything over the past couple of days. Patients nurse at her SNF reports that wound on her left foot heel looks significantly worse today with surrounding erythema. She was admitted to the hospital for evaluation of dehydration, UTI and acute encephalopathy secondary to the above.   Assessment/Plan: Acute encephalopathy : possibly metabolic from hypernatremia, dehdyration and UTI. She appears to be back to her baseline, she is alert and is eating a little.    Urinary tract infection: Cultures  Show proteus sensitive to keflex. Resume the same.   Severe PAD with left heel ulcer chronic: Vascular surgery consulted and she will need above knee amputation if the family wants surgery. Currently family is talking with palliative care physicians for goals of care .   Hypernatremia and dehydration: resolved.   Mildly elevated point of care troponin: The troponin levels follow ing are all negative. She denies any chest pain.     Code Status: DNR Family Communication: none at bedside, will call daughter later today and update her.  Disposition Plan: pending.    Consultants: Orthopedics  Palliative care consult. Procedures:  none  Antibiotics:  IV cefepime 9/2  HPI/Subjective: Denies any new complaints, wants to sit up. No family at bedside  Objective: Filed Vitals:   11/26/13 0954  BP: 149/64  Pulse: 85  Temp: 97.6 F (36.4 C)  Resp: 19    Intake/Output Summary (Last 24 hours) at 11/26/13 1332 Last data filed at 11/26/13 0350  Gross per 24 hour  Intake    540 ml  Output    450 ml  Net     90 ml   Filed Weights   11/23/13 2245  Weight: 43.5 kg (95 lb 14.4 oz)    Exam:   General:  Alert and pleasantly  confused,   Cardiovascular: s1s2  Respiratory: diminished air entry at bases, no wheezing or rhonchi  Abdomen: soft non tender non distended bowel sounds heard  Musculoskeletal: no pedal edema, left heel with 8 cm area wound with dry black eschar, with foul odor.   Data Reviewed: Basic Metabolic Panel:  Recent Labs Lab 11/23/13 2305 11/24/13 1307  NA 148* 145  K 4.3 3.8  CL 114* 115*  CO2 24 20  GLUCOSE 100* 135*  BUN 26* 21  CREATININE 0.61 0.48*  CALCIUM 8.0* 7.5*   Liver Function Tests:  Recent Labs Lab 11/23/13 2305  AST 21  ALT 12  ALKPHOS 70  BILITOT 0.4  PROT 5.9*  ALBUMIN 2.3*   No results found for this basename: LIPASE, AMYLASE,  in the last 168 hours No results found for this basename: AMMONIA,  in the last 168 hours CBC:  Recent Labs Lab 11/23/13 2305  WBC 9.9  NEUTROABS 7.7  HGB 11.6*  HCT 37.2  MCV 92.8  PLT 336   Cardiac Enzymes:  Recent Labs Lab 11/23/13 2305 11/24/13 0045 11/24/13 0809 11/24/13 1159  CKTOTAL 36  --   --   --   TROPONINI  --  <0.30 <0.30 <0.30   BNP (last 3 results)  Recent Labs  12/04/12 2039 12/05/12 0519  PROBNP 14051.0* 14473.0*   CBG: No results found for this basename: GLUCAP,  in the last 168 hours  Recent Results (from the past  240 hour(s))  URINE CULTURE     Status: None   Collection Time    11/23/13 10:45 PM      Result Value Ref Range Status   Specimen Description URINE, CATHETERIZED   Final   Special Requests NONE   Final   Culture  Setup Time     Final   Value: 11/23/2013 02:00     Performed at [facility]     Final   Value: >=100,000 COLONIES/ML     Performed at [facility]   Culture     Final   Value: PROTEUS MIRABILIS     Performed at [facility]   Report Status 11/26/2013 FINAL   Final   Organism ID, Bacteria PROTEUS MIRABILIS   Final  CULTURE, BLOOD (ROUTINE X 2)     Status: None   Collection Time    11/24/13 12:42 AM      Result  Value Ref Range Status   Specimen Description BLOOD LEFT WRIST   Final   Special Requests BOTTLES DRAWN AEROBIC ONLY 10CC   Final   Culture  Setup Time     Final   Value: 11/24/2013 08:24     Performed at [facility]   Culture     Final   Value:        BLOOD CULTURE RECEIVED NO GROWTH TO DATE CULTURE WILL BE HELD FOR 5 DAYS BEFORE ISSUING A FINAL NEGATIVE REPORT     Performed at [facility]   Report Status PENDING   Incomplete  CULTURE, BLOOD (ROUTINE X 2)     Status: None   Collection Time    11/24/13 12:45 AM      Result Value Ref Range Status   Specimen Description BLOOD RIGHT ARM   Final   Special Requests BOTTLES DRAWN AEROBIC AND ANAEROBIC 6CC   Final   Culture  Setup Time     Final   Value: 11/24/2013 08:25     Performed at [facility]   Culture     Final   Value:        BLOOD CULTURE RECEIVED NO GROWTH TO DATE CULTURE WILL BE HELD FOR 5 DAYS BEFORE ISSUING A FINAL NEGATIVE REPORT     Performed at [facility]   Report Status PENDING   Incomplete     Studies: No results found.  Scheduled Meds: . acetaminophen  650 mg Oral Q12H  . amLODipine  5 mg Oral Daily  . antiseptic oral rinse  7 mL Mouth Rinse BID  . aspirin EC  325 mg Oral Daily  . calcium-vitamin D  1 tablet Oral BID WC  . cephALEXin  500 mg Oral Q12H  . feeding supplement (ENSURE COMPLETE)  237 mL Oral BID BM  . feeding supplement (PRO-STAT SUGAR FREE 64)  30 mL Oral BID  . heparin  5,000 Units Subcutaneous 3 times per day  . levothyroxine  25 mcg Oral QAC breakfast  . multivitamin with minerals  1 tablet Oral Daily  . pantoprazole  40 mg Oral Daily  . simvastatin  20 mg Oral QPM  . sodium chloride  30 mL/kg Intravenous Once   Continuous Infusions:    Principal Problem:   Altered mental status Active Problems:   Troponin I above reference range   UTI (lower urinary tract infection)   PAD (peripheral artery disease)   Heel ulcer   Protein-calorie malnutrition,  severe  Time spent: 25 minutes    [facility] Pager [phone] If 7PM-7AM, please contact night-coverage at [email or web address], password [facility] 11/26/2013, 1:32 PM  LOS: 3 days

## 2013-11-27 LAB — BASIC METABOLIC PANEL
Anion gap: 11 (ref 5–15)
BUN: 20 mg/dL (ref 6–23)
CO2: 21 mEq/L (ref 19–32)
Calcium: 8 mg/dL — ABNORMAL LOW (ref 8.4–10.5)
Chloride: 118 mEq/L — ABNORMAL HIGH (ref 96–112)
Creatinine, Ser: 0.41 mg/dL — ABNORMAL LOW (ref 0.50–1.10)
GFR calc Af Amer: >90 mL/min (ref >90)
GFR, EST NON AFRICAN AMERICAN: 81 mL/min — AB (ref >90)
Glucose, Bld: 97 mg/dL (ref 70–99)
Potassium: 3.8 mEq/L (ref 3.7–5.3)
Sodium: 150 mEq/L — ABNORMAL HIGH (ref 137–147)

## 2013-11-27 MED ORDER — DEXTROSE-NACL 5-0.45 % IV SOLN
INTRAVENOUS | Status: DC
  Administered 2013-11-27 – 2013-11-28 (×2): via INTRAVENOUS

## 2013-11-27 MED ORDER — FREE WATER
200.0000 mL | Freq: Four times a day (QID) | Status: DC
  Administered 2013-11-27 – 2013-12-01 (×15): 200 mL

## 2013-11-27 NOTE — Progress Notes (Signed)
[facility] PROGRESS NOTE  Julie Frank QAS:[id number] DOB: 06/01/1909 DOA: 11/23/2013 PCP: Hennie Duos, MD Interim summary: Julie Frank is a 80 y.o. female who presents to ED with daughter due to being more sleepy and not eating or drinking anything over the past couple of days. Patients nurse at her SNF reports that wound on her left foot heel looks significantly worse today with surrounding erythema. She was admitted to the hospital for evaluation of dehydration, UTI and acute encephalopathy secondary to the above.   Assessment/Plan: Acute encephalopathy : possibly metabolic from hypernatremia, dehdyration and UTI. She appears to be back to her baseline, she is alert and is eating a little.    Urinary tract infection: Cultures  Show proteus sensitive to keflex. Resume the same.   Severe PAD with left heel ulcer chronic: Vascular surgery consulted and she will need above knee amputation if the family wants surgery.  Spoke to her daughter, she definitely does not want surgery at this time. Currently family is talking with palliative care physicians for goals of care .   Hypernatremia and dehydration:  Free water boluses with thickener every 6 hours.   Mildly elevated point of care troponin: The troponin levels follow ing are all negative. She denies any chest pain.     Code Status: DNR Family Communication: none at bedside, will call daughter later today and update her.  Disposition Plan: pending.    Consultants: Orthopedics  Palliative care consult. Procedures:  none  Antibiotics:  IV cefepime 9/2- 9/4  keflex  HPI/Subjective: Denies any new complaints, wants to sit up. No family at bedside  Objective: Filed Vitals:   11/27/13 0912  BP: 154/71  Pulse: 88  Temp: 98.2 F (36.8 C)  Resp: 20    Intake/Output Summary (Last 24 hours) at 11/27/13 1751 Last data filed at 11/27/13 1254  Gross per 24 hour  Intake    360 ml  Output   1450 ml   Net  -1090 ml   Filed Weights   11/23/13 2245 11/27/13 0505  Weight: 43.5 kg (95 lb 14.4 oz) 48.7 kg (107 lb 5.8 oz)    Exam:   General:  Alert and pleasantly confused,   Cardiovascular: s1s2  Respiratory: diminished air entry at bases, no wheezing or rhonchi  Abdomen: soft non tender non distended bowel sounds heard  Musculoskeletal: no pedal edema, left heel with 8 cm area wound with dry black eschar, with foul odor.   Data Reviewed: Basic Metabolic Panel:  Recent Labs Lab 11/23/13 2305 11/24/13 1307 11/26/13 1310 11/27/13 0350  NA 148* 145 148* 150*  K 4.3 3.8 3.7 3.8  CL 114* 115* 118* 118*  CO2 24 20 19 21   GLUCOSE 100* 135* 103* 97  BUN 26* 21 19 20   CREATININE 0.61 0.48* 0.45* 0.41*  CALCIUM 8.0* 7.5* 7.7* 8.0*   Liver Function Tests:  Recent Labs Lab 11/23/13 2305  AST 21  ALT 12  ALKPHOS 70  BILITOT 0.4  PROT 5.9*  ALBUMIN 2.3*   No results found for this basename: LIPASE, AMYLASE,  in the last 168 hours No results found for this basename: AMMONIA,  in the last 168 hours CBC:  Recent Labs Lab 11/23/13 2305 11/26/13 1310  WBC 9.9 11.5*  NEUTROABS 7.7  --   HGB 11.6* 10.5*  HCT 37.2 34.1*  MCV 92.8 90.9  PLT 336 371   Cardiac Enzymes:  Recent Labs Lab 11/23/13 2305 11/24/13 0045 11/24/13 0809 11/24/13 1159  CKTOTAL 36  --   --   --   TROPONINI  --  <0.30 <0.30 <0.30   BNP (last 3 results)  Recent Labs  12/04/12 2039 12/05/12 0519  PROBNP 14051.0* 14473.0*   CBG: No results found for this basename: GLUCAP,  in the last 168 hours  Recent Results (from the past 240 hour(s))  URINE CULTURE     Status: None   Collection Time    11/23/13 10:45 PM      Result Value Ref Range Status   Specimen Description URINE, CATHETERIZED   Final   Special Requests NONE   Final   Culture  Setup Time     Final   Value: 11/23/2013 02:00     Performed at [facility]     Final   Value: >=100,000 COLONIES/ML      Performed at [facility]   Culture     Final   Value: PROTEUS MIRABILIS     Performed at [facility]   Report Status 11/26/2013 FINAL   Final   Organism ID, Bacteria PROTEUS MIRABILIS   Final  CULTURE, BLOOD (ROUTINE X 2)     Status: None   Collection Time    11/24/13 12:42 AM      Result Value Ref Range Status   Specimen Description BLOOD LEFT WRIST   Final   Special Requests BOTTLES DRAWN AEROBIC ONLY 10CC   Final   Culture  Setup Time     Final   Value: 11/24/2013 08:24     Performed at [facility]   Culture     Final   Value:        BLOOD CULTURE RECEIVED NO GROWTH TO DATE CULTURE WILL BE HELD FOR 5 DAYS BEFORE ISSUING A FINAL NEGATIVE REPORT     Performed at [facility]   Report Status PENDING   Incomplete  CULTURE, BLOOD (ROUTINE X 2)     Status: None   Collection Time    11/24/13 12:45 AM      Result Value Ref Range Status   Specimen Description BLOOD RIGHT ARM   Final   Special Requests BOTTLES DRAWN AEROBIC AND ANAEROBIC 6CC   Final   Culture  Setup Time     Final   Value: 11/24/2013 08:25     Performed at [facility]   Culture     Final   Value:        BLOOD CULTURE RECEIVED NO GROWTH TO DATE CULTURE WILL BE HELD FOR 5 DAYS BEFORE ISSUING A FINAL NEGATIVE REPORT     Performed at [facility]   Report Status PENDING   Incomplete     Studies: Dg Chest Port 1 View  11/26/2013   CLINICAL DATA:  One 29 and 87-year-old female with increased shortness of Breath. Initial encounter.  EXAM: PORTABLE CHEST - 1 VIEW  COMPARISON:  11/23/2013 and earlier.  FINDINGS: Portable AP semi upright view at 1339 hrs. The patient is less rotated. New small to moderate bilateral veiling pulmonary opacities. No pneumothorax. Pulmonary vascularity appears mildly increased, but without overt pulmonary edema. Stable cardiomegaly and mediastinal contours.  IMPRESSION: New small to moderate bilateral pleural effusions.   Electronically Signed    By: Lars Pinks M.D.   On: 11/26/2013 13:49    Scheduled Meds: . acetaminophen  650 mg Oral Q12H  . amLODipine  5 mg Oral Daily  . antiseptic oral rinse  7 mL Mouth Rinse BID  . aspirin EC  325 mg Oral Daily  . calcium-vitamin D  1 tablet Oral BID WC  . cephALEXin  500 mg Oral Q12H  . feeding supplement (ENSURE COMPLETE)  237 mL Oral BID BM  . feeding supplement (PRO-STAT SUGAR FREE 64)  30 mL Oral BID  . free water  200 mL Per Tube Q6H  . heparin  5,000 Units Subcutaneous 3 times per day  . levothyroxine  25 mcg Oral QAC breakfast  . multivitamin with minerals  1 tablet Oral Daily  . pantoprazole  40 mg Oral Daily  . simvastatin  20 mg Oral QPM  . sodium chloride  30 mL/kg Intravenous Once   Continuous Infusions: . dextrose 5 % and 0.45% NaCl 50 mL/hr at 11/27/13 1022    Principal Problem:   Altered mental status Active Problems:   Troponin I above reference range   UTI (lower urinary tract infection)   PAD (peripheral artery disease)   Heel ulcer   Protein-calorie malnutrition, severe    Time spent: 25 minutes    Bryant Lipps  [facility] Pager [phone] If 7PM-7AM, please contact night-coverage at [email or web address], password [facility] 11/27/2013, 5:51 PM  LOS: 4 days

## 2013-11-28 LAB — CBC
HCT: 35.1 % — ABNORMAL LOW (ref 36.0–46.0)
Hemoglobin: 10.9 g/dL — ABNORMAL LOW (ref 12.0–15.0)
MCH: 28.6 pg (ref 26.0–34.0)
MCHC: 31.1 g/dL (ref 30.0–36.0)
MCV: 92.1 fL (ref 78.0–100.0)
PLATELETS: 316 10*3/uL (ref 150–400)
RBC: 3.81 MIL/uL — AB (ref 3.87–5.11)
RDW: 15.9 % — ABNORMAL HIGH (ref 11.5–15.5)
WBC: 10.9 10*3/uL — ABNORMAL HIGH (ref 4.0–10.5)

## 2013-11-28 LAB — BASIC METABOLIC PANEL
Anion gap: 12 (ref 5–15)
BUN: 15 mg/dL (ref 6–23)
CALCIUM: 7.7 mg/dL — AB (ref 8.4–10.5)
CO2: 20 meq/L (ref 19–32)
CREATININE: 0.39 mg/dL — AB (ref 0.50–1.10)
Chloride: 109 mEq/L (ref 96–112)
GFR calc Af Amer: >90 mL/min (ref >90)
GFR calc non Af Amer: 82 mL/min — ABNORMAL LOW (ref >90)
Glucose, Bld: 95 mg/dL (ref 70–99)
Potassium: 3.5 mEq/L — ABNORMAL LOW (ref 3.7–5.3)
Sodium: 141 mEq/L (ref 137–147)

## 2013-11-28 MED ORDER — IPRATROPIUM-ALBUTEROL 0.5-2.5 (3) MG/3ML IN SOLN
3.0000 mL | RESPIRATORY_TRACT | Status: DC | PRN
  Administered 2013-11-28 (×2): 3 mL via RESPIRATORY_TRACT
  Filled 2013-11-28 (×2): qty 3

## 2013-11-28 MED ORDER — POTASSIUM CHLORIDE CRYS ER 20 MEQ PO TBCR
40.0000 meq | EXTENDED_RELEASE_TABLET | Freq: Once | ORAL | Status: AC
  Administered 2013-11-28: 40 meq via ORAL
  Filled 2013-11-28: qty 2

## 2013-11-28 NOTE — Progress Notes (Signed)
[facility] PROGRESS NOTE  Julie Frank QVZ:[id number] DOB: Apr 20, 1909 DOA: 11/23/2013 PCP: Hennie Duos, MD Interim summary: Julie Frank is a 80 y.o. female who presents to ED with daughter due to being more sleepy and not eating or drinking anything over the past couple of days. Patients nurse at her SNF reports that wound on her left foot heel looks significantly worse today with surrounding erythema. She was admitted to the hospital for evaluation of dehydration, UTI and acute encephalopathy secondary to the above.   Assessment/Plan: Acute encephalopathy : possibly metabolic from hypernatremia, dehdyration and UTI. She appears to be back to her baseline, she is alert and is eating a little.    Urinary tract infection: Cultures  Show proteus sensitive to keflex. Resume the same.   Severe PAD with left heel ulcer chronic: Vascular surgery consulted and she will need above knee amputation if the family wants surgery.  Spoke to her daughter, she definitely does not want surgery at this time. Currently family is talking with palliative care physicians for goals of care .   Hypernatremia and dehydration:  Free water boluses with thickener every 6 hours. IMPROVING.   Mildly elevated point of care troponin: The troponin levels follow ing are all negative. She denies any chest pain.     Code Status: DNR Family Communication: none at bedside,spoke to daughter over the phone  Disposition Plan: pending.    Consultants: Orthopedics  Palliative care consult. Procedures:  none  Antibiotics:  IV cefepime 9/2- 9/4  keflex  HPI/Subjective: Denies any new complaints, wants to sit up. No family at bedside  Objective: Filed Vitals:   11/28/13 0918  BP: 140/76  Pulse: 82  Temp: 97.7 F (36.5 C)  Resp: 22    Intake/Output Summary (Last 24 hours) at 11/28/13 1540 Last data filed at 11/28/13 3295  Gross per 24 hour  Intake    780 ml  Output    995 ml  Net    -215 ml   Filed Weights   11/23/13 2245 11/27/13 0505  Weight: 43.5 kg (95 lb 14.4 oz) 48.7 kg (107 lb 5.8 oz)    Exam:   General:  Alert and pleasantly confused,   Cardiovascular: s1s2  Respiratory: diminished air entry at bases, no wheezing or rhonchi  Abdomen: soft non tender non distended bowel sounds heard  Musculoskeletal: no pedal edema, left heel with 8 cm area wound with dry black eschar, with foul odor.   Data Reviewed: Basic Metabolic Panel:  Recent Labs Lab 11/23/13 2305 11/24/13 1307 11/26/13 1310 11/27/13 0350 11/28/13 0438  NA 148* 145 148* 150* 141  K 4.3 3.8 3.7 3.8 3.5*  CL 114* 115* 118* 118* 109  CO2 24 20 19 21 20   GLUCOSE 100* 135* 103* 97 95  BUN 26* 21 19 20 15   CREATININE 0.61 0.48* 0.45* 0.41* 0.39*  CALCIUM 8.0* 7.5* 7.7* 8.0* 7.7*   Liver Function Tests:  Recent Labs Lab 11/23/13 2305  AST 21  ALT 12  ALKPHOS 70  BILITOT 0.4  PROT 5.9*  ALBUMIN 2.3*   No results found for this basename: LIPASE, AMYLASE,  in the last 168 hours No results found for this basename: AMMONIA,  in the last 168 hours CBC:  Recent Labs Lab 11/23/13 2305 11/26/13 1310 11/28/13 0438  WBC 9.9 11.5* 10.9*  NEUTROABS 7.7  --   --   HGB 11.6* 10.5* 10.9*  HCT 37.2 34.1* 35.1*  MCV 92.8 90.9 92.1  PLT  336 371 316   Cardiac Enzymes:  Recent Labs Lab 11/23/13 2305 11/24/13 0045 11/24/13 0809 11/24/13 1159  CKTOTAL 36  --   --   --   TROPONINI  --  <0.30 <0.30 <0.30   BNP (last 3 results)  Recent Labs  12/04/12 2039 12/05/12 0519  PROBNP 14051.0* 14473.0*   CBG: No results found for this basename: GLUCAP,  in the last 168 hours  Recent Results (from the past 240 hour(s))  URINE CULTURE     Status: None   Collection Time    11/23/13 10:45 PM      Result Value Ref Range Status   Specimen Description URINE, CATHETERIZED   Final   Special Requests NONE   Final   Culture  Setup Time     Final   Value: 11/23/2013 02:00     Performed  at [facility]     Final   Value: >=100,000 COLONIES/ML     Performed at [facility]   Culture     Final   Value: PROTEUS MIRABILIS     Performed at [facility]   Report Status 11/26/2013 FINAL   Final   Organism ID, Bacteria PROTEUS MIRABILIS   Final  CULTURE, BLOOD (ROUTINE X 2)     Status: None   Collection Time    11/24/13 12:42 AM      Result Value Ref Range Status   Specimen Description BLOOD LEFT WRIST   Final   Special Requests BOTTLES DRAWN AEROBIC ONLY 10CC   Final   Culture  Setup Time     Final   Value: 11/24/2013 08:24     Performed at [facility]   Culture     Final   Value:        BLOOD CULTURE RECEIVED NO GROWTH TO DATE CULTURE WILL BE HELD FOR 5 DAYS BEFORE ISSUING A FINAL NEGATIVE REPORT     Performed at [facility]   Report Status PENDING   Incomplete  CULTURE, BLOOD (ROUTINE X 2)     Status: None   Collection Time    11/24/13 12:45 AM      Result Value Ref Range Status   Specimen Description BLOOD RIGHT ARM   Final   Special Requests BOTTLES DRAWN AEROBIC AND ANAEROBIC 6CC   Final   Culture  Setup Time     Final   Value: 11/24/2013 08:25     Performed at [facility]   Culture     Final   Value:        BLOOD CULTURE RECEIVED NO GROWTH TO DATE CULTURE WILL BE HELD FOR 5 DAYS BEFORE ISSUING A FINAL NEGATIVE REPORT     Performed at [facility]   Report Status PENDING   Incomplete     Studies: No results found.  Scheduled Meds: . acetaminophen  650 mg Oral Q12H  . amLODipine  5 mg Oral Daily  . antiseptic oral rinse  7 mL Mouth Rinse BID  . aspirin EC  325 mg Oral Daily  . calcium-vitamin D  1 tablet Oral BID WC  . cephALEXin  500 mg Oral Q12H  . feeding supplement (ENSURE COMPLETE)  237 mL Oral BID BM  . feeding supplement (PRO-STAT SUGAR FREE 64)  30 mL Oral BID  . free water  200 mL Per Tube Q6H  . heparin  5,000 Units Subcutaneous 3 times per day  . levothyroxine  25 mcg  Oral QAC breakfast  . multivitamin with minerals  1 tablet Oral Daily  . pantoprazole  40 mg Oral Daily  . simvastatin  20 mg Oral QPM  . sodium chloride  30 mL/kg Intravenous Once   Continuous Infusions:    Principal Problem:   Altered mental status Active Problems:   Troponin I above reference range   UTI (lower urinary tract infection)   PAD (peripheral artery disease)   Heel ulcer   Protein-calorie malnutrition, severe    Time spent: 25 minutes    [facility] Pager [phone] If 7PM-7AM, please contact night-coverage at [email or web address], password [facility] 11/28/2013, 3:40 PM  LOS: 5 days

## 2013-11-28 NOTE — Evaluation (Signed)
Physical Therapy Evaluation Patient Details Name: Julie Frank MRN: [id number] DOB: 1909/06/28 Today's Date: 11/28/2013   History of Present Illness  Julie Frank is a 80 y.o. female who presents to ED with daughter due to being more sleepy and not eating or drinking anything over the past couple of days. Patients nurse at her SNF reports that wound on her left foot heel looks significantly worse today with surrounding erythema. She was admitted to the hospital for evaluation of dehydration, UTI and acute encephalopathy secondary to the above.    Clinical Impression   Pt admitted with above. Pt currently with functional limitations due to the deficits listed below (see PT Problem List).  Pt will benefit from skilled PT to increase their independence and safety with mobility to allow discharge to the venue listed below.       Follow Up Recommendations SNF    Equipment Recommendations  None recommended by PT    Recommendations for Other Services       Precautions / Restrictions Precautions Precautions: Fall      Mobility  Bed Mobility Overal bed mobility: Needs Assistance;+2 for physical assistance Bed Mobility: Rolling;Sidelying to Sit Rolling: Max assist Sidelying to sit: +2 for physical assistance;Max assist       General bed mobility comments: Tactile cueing for all aspects of mobility; Pt held to rail to roll, then needed cueing to let go of rail for sitting up max physical asist to sit up  Transfers Overall transfer level: Needs assistance Equipment used:  (Dependent pivot transfer with use of bed pad) Transfers: Squat Pivot Transfers     Squat pivot transfers: +2 physical assistance;Max assist     General transfer comment: Blocked knees for stability with transfer; Armrest down for better ease; second person helpful to keep chair steady  Ambulation/Gait                Stairs            Wheelchair Mobility    Modified Rankin (Stroke  Patients Only)       Balance Overall balance assessment: Needs assistance Sitting-balance support: Single extremity supported Sitting balance-Leahy Scale: Poor Sitting balance - Comments: assist to keep upright sitting Postural control: Right lateral lean;Posterior lean                                   Pertinent Vitals/Pain Pain Assessment: Faces Faces Pain Scale: Hurts even more Pain Location: Pt unable to state, but noted grimace with getting up to chair Pain Descriptors / Indicators: Grimacing Pain Intervention(s): Monitored during session;Repositioned    Home Living Family/patient expects to be discharged to:: Skilled nursing facility                      Prior Function Level of Independence: Needs assistance   Gait / Transfers Assistance Needed: unclear PLOF  ADL's / Homemaking Assistance Needed: assist for all ADLs  Comments: per report, pt was max asist for transfers to wc; unsure if they used a lift at the facility     Hand Dominance        Extremity/Trunk Assessment   Upper Extremity Assessment: Defer to OT evaluation;Generalized weakness           Lower Extremity Assessment: Generalized weakness      Cervical / Trunk Assessment: Kyphotic  Communication   Communication: Expressive difficulties;HOH  Cognition Arousal/Alertness: Awake/alert (easily  aroused once sitting up EOB) Behavior During Therapy: WFL for tasks assessed/performed Overall Cognitive Status: No family/caregiver present to determine baseline cognitive functioning                      General Comments      Exercises        Assessment/Plan    PT Assessment Patient needs continued PT services  PT Diagnosis Generalized weakness   PT Problem List Decreased strength;Decreased range of motion;Decreased activity tolerance;Decreased balance;Decreased mobility;Decreased coordination  PT Treatment Interventions DME instruction;Functional mobility  training;Therapeutic activities;Therapeutic exercise;Balance training;Neuromuscular re-education;Patient/family education   PT Goals (Current goals can be found in the Care Plan section) Acute Rehab PT Goals PT Goal Formulation: Patient unable to participate in goal setting Time For Goal Achievement: 12/12/13 Potential to Achieve Goals: Fair    Frequency Min 2X/week   Barriers to discharge        Co-evaluation               End of Session Equipment Utilized During Treatment: Oxygen (bed pad) Activity Tolerance: Patient tolerated treatment well Patient left: in chair;with call bell/phone within reach;with nursing/sitter in room (heels floated) Nurse Communication: Mobility status;Need for lift equipment         Time: 1660-6301 PT Time Calculation (min): 18 min   Charges:   PT Evaluation $Initial PT Evaluation Tier I: 1 Procedure PT Treatments $Therapeutic Activity: 8-22 mins   PT G Codes:          Quin Hoop 11/28/2013, 5:14 PM  [place], [place]  Acute Rehabilitation Services Pager [phone] Office [phone]

## 2013-11-28 NOTE — Progress Notes (Signed)
Patient UQ:[id number] IZELLA YBANEZ      DOB: 1909/05/28      BWL:[id number]  Spoke with daughter this morning.  She did not end up coming to hospital yesterday because she was not feeling well.  May be in later today.  She feels worn out. Her house is going into foreclosure this weekend as well. She knows no surgical intervention at this time.  Wants foot kept clean/dry/bandaged.  Daughter trying to decide between going back to [facility] vs looking into other nursing home possibilities.  She is working with social work on this.  I do not sense from my conversation today that daughter would be open to hospice care though unable to have extensive conversation today.  She would be benefit from outpatient palliative care as minimum. Daughter would like to follow-up with Dr Lovena Le tomorrow.       Doran Clay D.O. Palliative Medicine Team at [facility]  Pager: [phone] Team Phone: [phone]

## 2013-11-29 LAB — BASIC METABOLIC PANEL
ANION GAP: 11 (ref 5–15)
BUN: 18 mg/dL (ref 6–23)
CALCIUM: 8.1 mg/dL — AB (ref 8.4–10.5)
CO2: 22 meq/L (ref 19–32)
CREATININE: 0.41 mg/dL — AB (ref 0.50–1.10)
Chloride: 111 mEq/L (ref 96–112)
GFR calc Af Amer: >90 mL/min (ref >90)
GFR calc non Af Amer: 81 mL/min — ABNORMAL LOW (ref >90)
GLUCOSE: 92 mg/dL (ref 70–99)
Potassium: 3.8 mEq/L (ref 3.7–5.3)
Sodium: 144 mEq/L (ref 137–147)

## 2013-11-29 NOTE — Progress Notes (Signed)
Patient [name]      DOB: Apr 08, 1909      CZY:[id number]  Meeting with Patient's daughter at 4 pm today to follow up goals of care meeting from last week.   Eladia Frame L. Lovena Le, MD MBA The Palliative Medicine Team at [facility] Phone: [phone] Pager: [phone] ( Use team phone after hours)

## 2013-11-29 NOTE — Progress Notes (Addendum)
[facility] PROGRESS NOTE  SHANTIKA BERMEA XBW:[id number] DOB: 08/06/1909 DOA: 11/23/2013 PCP: Hennie Duos, MD Interim summary: Julie Frank is a 80 y.o. female who presents to ED with daughter due to being more sleepy and not eating or drinking anything over the past couple of days. Patients nurse at her SNF reports that wound on her left foot heel looks significantly worse today with surrounding erythema. She was admitted to the hospital for evaluation of dehydration, UTI and acute encephalopathy secondary to the above.   Assessment/Plan: Acute encephalopathy : possibly metabolic from hypernatremia, dehdyration and UTI. She appears to be back to her baseline, she is alert and is eating a little.    Urinary tract infection: Cultures  Show proteus sensitive to keflex. Resume the same.   Severe PAD with left heel ulcer chronic: Vascular surgery consulted and she will need above knee amputation if the family wants surgery.  Spoke to her daughter, she definitely does not want surgery at this time. Currently family is talking with palliative care physicians for goals of care .   Hypernatremia and dehydration:  Free water boluses with thickener every 6 hours. IMPROVING.   Mildly elevated point of care troponin: The troponin levels follow ing are all negative. She denies any chest pain.     Code Status: DNR Family Communication: none at bedside,spoke to daughter over the phone  Disposition Plan: awaiting SNF placement.   Consultants: Orthopedics  Palliative care consult. Procedures:  none  Antibiotics:  IV cefepime 9/2- 9/4  keflex  HPI/Subjective: Denies any new complaints,. No family at bedside  Objective: Filed Vitals:   11/29/13 1836  BP: 138/84  Pulse: 95  Temp: 97.3 F (36.3 C)  Resp: 19    Intake/Output Summary (Last 24 hours) at 11/29/13 1842 Last data filed at 11/29/13 0835  Gross per 24 hour  Intake    240 ml  Output    725 ml  Net    -485 ml   Filed Weights   11/23/13 2245 11/27/13 0505 11/28/13 2100  Weight: 43.5 kg (95 lb 14.4 oz) 48.7 kg (107 lb 5.8 oz) 52.6 kg (115 lb 15.4 oz)    Exam:   General:  Alert and pleasantly confused,   Cardiovascular: s1s2  Respiratory: diminished air entry at bases, no wheezing or rhonchi  Abdomen: soft non tender non distended bowel sounds heard  Musculoskeletal: no pedal edema, left heel with 8 cm area wound with dry black eschar, with foul odor.   Data Reviewed: Basic Metabolic Panel:  Recent Labs Lab 11/24/13 1307 11/26/13 1310 11/27/13 0350 11/28/13 0438 11/29/13 0500  NA 145 148* 150* 141 144  K 3.8 3.7 3.8 3.5* 3.8  CL 115* 118* 118* 109 111  CO2 20 19 21 20 22   GLUCOSE 135* 103* 97 95 92  BUN 21 19 20 15 18   CREATININE 0.48* 0.45* 0.41* 0.39* 0.41*  CALCIUM 7.5* 7.7* 8.0* 7.7* 8.1*   Liver Function Tests:  Recent Labs Lab 11/23/13 2305  AST 21  ALT 12  ALKPHOS 70  BILITOT 0.4  PROT 5.9*  ALBUMIN 2.3*   No results found for this basename: LIPASE, AMYLASE,  in the last 168 hours No results found for this basename: AMMONIA,  in the last 168 hours CBC:  Recent Labs Lab 11/23/13 2305 11/26/13 1310 11/28/13 0438  WBC 9.9 11.5* 10.9*  NEUTROABS 7.7  --   --   HGB 11.6* 10.5* 10.9*  HCT 37.2 34.1* 35.1*  MCV  92.8 90.9 92.1  PLT 336 371 316   Cardiac Enzymes:  Recent Labs Lab 11/23/13 2305 11/24/13 0045 11/24/13 0809 11/24/13 1159  CKTOTAL 36  --   --   --   TROPONINI  --  <0.30 <0.30 <0.30   BNP (last 3 results)  Recent Labs  12/04/12 2039 12/05/12 0519  PROBNP 14051.0* 14473.0*   CBG: No results found for this basename: GLUCAP,  in the last 168 hours  Recent Results (from the past 240 hour(s))  URINE CULTURE     Status: None   Collection Time    11/23/13 10:45 PM      Result Value Ref Range Status   Specimen Description URINE, CATHETERIZED   Final   Special Requests NONE   Final   Culture  Setup Time     Final    Value: 11/23/2013 02:00     Performed at [facility]     Final   Value: >=100,000 COLONIES/ML     Performed at [facility]   Culture     Final   Value: PROTEUS MIRABILIS     Performed at [facility]   Report Status 11/26/2013 FINAL   Final   Organism ID, Bacteria PROTEUS MIRABILIS   Final  CULTURE, BLOOD (ROUTINE X 2)     Status: None   Collection Time    11/24/13 12:42 AM      Result Value Ref Range Status   Specimen Description BLOOD LEFT WRIST   Final   Special Requests BOTTLES DRAWN AEROBIC ONLY 10CC   Final   Culture  Setup Time     Final   Value: 11/24/2013 08:24     Performed at [facility]   Culture     Final   Value:        BLOOD CULTURE RECEIVED NO GROWTH TO DATE CULTURE WILL BE HELD FOR 5 DAYS BEFORE ISSUING A FINAL NEGATIVE REPORT     Performed at [facility]   Report Status PENDING   Incomplete  CULTURE, BLOOD (ROUTINE X 2)     Status: None   Collection Time    11/24/13 12:45 AM      Result Value Ref Range Status   Specimen Description BLOOD RIGHT ARM   Final   Special Requests BOTTLES DRAWN AEROBIC AND ANAEROBIC 6CC   Final   Culture  Setup Time     Final   Value: 11/24/2013 08:25     Performed at [facility]   Culture     Final   Value:        BLOOD CULTURE RECEIVED NO GROWTH TO DATE CULTURE WILL BE HELD FOR 5 DAYS BEFORE ISSUING A FINAL NEGATIVE REPORT     Performed at [facility]   Report Status PENDING   Incomplete     Studies: No results found.  Scheduled Meds: . acetaminophen  650 mg Oral Q12H  . amLODipine  5 mg Oral Daily  . antiseptic oral rinse  7 mL Mouth Rinse BID  . aspirin EC  325 mg Oral Daily  . calcium-vitamin D  1 tablet Oral BID WC  . cephALEXin  500 mg Oral Q12H  . feeding supplement (ENSURE COMPLETE)  237 mL Oral BID BM  . feeding supplement (PRO-STAT SUGAR FREE 64)  30 mL Oral BID  . free water  200 mL Per Tube Q6H  . heparin  5,000 Units Subcutaneous 3  times  per day  . levothyroxine  25 mcg Oral QAC breakfast  . multivitamin with minerals  1 tablet Oral Daily  . pantoprazole  40 mg Oral Daily  . simvastatin  20 mg Oral QPM  . sodium chloride  30 mL/kg Intravenous Once   Continuous Infusions:    Principal Problem:   Altered mental status Active Problems:   Troponin I above reference range   UTI (lower urinary tract infection)   PAD (peripheral artery disease)   Heel ulcer   Protein-calorie malnutrition, severe    Time spent: 25 minutes    [facility] Pager [phone] If 7PM-7AM, please contact night-coverage at [email or web address], password [facility] 11/29/2013, 6:42 PM  LOS: 6 days

## 2013-11-29 NOTE — Clinical Social Work Note (Signed)
CSW received voice mail from patient's daughter Emiah Pellicano regarding patient. Daughter advised that she will be meeting with Dr. Lovena Le at 4 pm then will visit with her mother. Per daughter discharge plan still "up in the air". CSW talked with attending MD today and was informed that daughter does not want patient to return to [place] at discharge. CSW will follow-up with daughter to discuss discharge plans.  Monya Kozakiewicz Givens, MSW, LCSW [phone]

## 2013-11-29 NOTE — Progress Notes (Signed)
OT Cancellation Note  Patient Details Name: Julie Frank MRN: [id number] DOB: 30-Jan-1910   Cancelled Treatment:    Reason Eval/Treat Not Completed: Other (comment) Pt is Medicare and current D/C plan is to go back to SNF. No apparent immediate acute care OT needs, therefore will defer OT to SNF. If OT eval is needed please call Acute Rehab Dept. at [phone] or text page OT at [phone].    Almon Register [phone] 11/29/2013, 6:49 AM

## 2013-11-30 ENCOUNTER — Inpatient Hospital Stay (HOSPITAL_COMMUNITY): Payer: Medicare Other

## 2013-11-30 LAB — CULTURE, BLOOD (ROUTINE X 2)
Culture: NO GROWTH
Culture: NO GROWTH

## 2013-11-30 NOTE — Care Management Note (Signed)
CARE MANAGEMENT NOTE 11/30/2013  Patient:  NARAYA, STONEBERG   Account Number:  [id number]  Date Initiated:  11/26/2013  Documentation initiated by:  Nayab Aten  Subjective/Objective Assessment:   CM following for progression and d/c planning.     Action/Plan:   Pt is SNF resident, will return to SNF level of care.  11/30/2013 Pt will return to SNF level of care pla to d/c tomorrow 12/01/2013.   Anticipated DC Date:  12/01/2013   Anticipated DC Plan:  SKILLED NURSING FACILITY         Choice offered to / List presented to:             Status of service:   Medicare Important Message given?  YES (If response is "NO", the following Medicare IM given date fields will be blank) Date Medicare IM given:  11/26/2013 Medicare IM given by:  Alejandra Hunt Date Additional Medicare IM given:  11/30/2013 Additional Medicare IM given by:  [facility]  Discharge Disposition:  [place]  Per UR Regulation:    If discussed at Long Length of Stay Meetings, dates discussed:    Comments:

## 2013-11-30 NOTE — Progress Notes (Addendum)
Patient EH:[id number] R Layne      DOB: 1909/12/07      OVZ:[id number]  Late entry for 9/8.  Emotional support only offered to daughter.  Patient clinically stable.  Daughter has elected conservative care of left lower extremity.  At present her view is treat the treatable while promoting comfort and dignity . She is not in "hospice mode", but would welcome palliative care support in the community. She is anxious about finding a place to return her mother.  Distance plays a role in where she would like to see her mother as she does not have the financial means to travel any distance outside [place]. She will be losing her home to foreclosure at 2 pm tomorrow which is another stressor.  I will check with the Spiritual Care department regarding any resource to put a safe roof over her head.   Total time 4-530 pm   Leighton Luster L. Lovena Le, MD MBA The Palliative Medicine Team at [facility] Phone: [phone] Pager: [phone] ( Use team phone after hours)

## 2013-11-30 NOTE — Discharge Summary (Signed)
Physician Discharge Summary  Julie Frank WSF:[id number] DOB: 04-Sep-1909 DOA: 11/23/2013  PCP: Hennie Duos, MD  Admit date: 11/23/2013 Discharge date: 11/30/2013  Time spent: 30 minutes  Recommendations for Outpatient Follow-up:  1. Follow up with PCP in 2 weeks.   Discharge Diagnoses:  Principal Problem:   Altered mental status Active Problems:   Troponin I above reference range   UTI (lower urinary tract infection)   PAD (peripheral artery disease)   Heel ulcer   Protein-calorie malnutrition, severe   Discharge Condition: improved Diet recommendation: dysphagia diet  3 with nectar thick liquid  Filed Weights   11/27/13 0505 11/28/13 2100 11/29/13 2113  Weight: 48.7 kg (107 lb 5.8 oz) 52.6 kg (115 lb 15.4 oz) 52.6 kg (115 lb 15.4 oz)    History of present illness:  Julie Frank is a 80 y.o. female who presents to ED with daughter due to being more sleepy and not eating or drinking anything over the past couple of days. Patients nurse at her SNF reports that wound on her left foot heel looks significantly worse today with surrounding erythema. She was admitted to the hospital for evaluation of dehydration, UTI and acute encephalopathy secondary to the above.    Hospital Course:  Acute encephalopathy : possibly metabolic from hypernatremia, dehdyration and UTI. She appears to be back to her baseline, she is alert and is eating a little.  Urinary tract infection:  Cultures Show proteus sensitive to keflex. She will have completed 7 days of antibiotics. Please stop keflex after today's dose.    Severe PAD with left heel ulcer chronic:  Vascular surgery consulted and she will need above knee amputation if the family wants surgery. Spoke to her daughter, she definitely does not want surgery at this time. Currently family is talking with palliative care physicians for goals of care .  Plan to resume palliative car services at the facility.   Hypernatremia and  dehydration:  Free water boluses with thickener every 6 hours. IMPROVING.   Mildly elevated point of care troponin:  The troponin levels follow ing are all negative. She denies any chest pain.    Procedures:  none  Consultations:  Palliative care  Wound care consult  Vascular consult  Physical therapy consult.   Discharge Exam: Filed Vitals:   11/30/13 0910  BP: 123/66  Pulse: 75  Temp: 97.1 F (36.2 C)  Resp: 18    General: alert afebrile comfortable Cardiovascular: s1s2 Respiratory: ctab  Discharge Instructions You were cared for by a hospitalist during your hospital stay. If you have any questions about your discharge medications or the care you received while you were in the hospital after you are discharged, you can call the unit and asked to speak with the hospitalist on call if the hospitalist that took care of you is not available. Once you are discharged, your primary care physician will handle any further medical issues. Please note that NO REFILLS for any discharge medications will be authorized once you are discharged, as it is imperative that you return to your primary care physician (or establish a relationship with a primary care physician if you do not have one) for your aftercare needs so that they can reassess your need for medications and monitor your lab values.   Current Discharge Medication List    CONTINUE these medications which have NOT CHANGED   Details  acetaminophen (TYLENOL) 325 MG tablet Take 650 mg by mouth every 12 (twelve) hours.  Amino Acids-Protein Hydrolys (FEEDING SUPPLEMENT, PRO-STAT SUGAR FREE 64,) LIQD Take 30 mLs by mouth 2 (two) times daily.    amLODipine (NORVASC) 5 MG tablet Take 5 mg by mouth daily.    aspirin EC 325 MG tablet Take 325 mg by mouth daily.    Calcium Carb-Cholecalciferol (CALCIUM 600 + D) 600-200 MG-UNIT TABS Take 1 tablet by mouth 2 (two) times daily.     cephALEXin (KEFLEX) 500 MG capsule Take 500 mg  by mouth 2 (two) times daily.    levothyroxine (SYNTHROID, LEVOTHROID) 25 MCG tablet Take 25 mcg by mouth daily.      Multiple Vitamin (MULTIVITAMIN) capsule Take 1 capsule by mouth daily.    omeprazole (PRILOSEC) 20 MG capsule Take 20 mg by mouth daily.    simvastatin (ZOCOR) 20 MG tablet Take 20 mg by mouth every evening.    traMADol (ULTRAM) 50 MG tablet Take 50 mg by mouth every 6 (six) hours as needed for moderate pain.       No Known Allergies    The results of significant diagnostics from this hospitalization (including imaging, microbiology, ancillary and laboratory) are listed below for reference.    Significant Diagnostic Studies: Dg Chest Port 1 View  11/26/2013   CLINICAL DATA:  One hundred and 56-year-old female with increased shortness of Breath. Initial encounter.  EXAM: PORTABLE CHEST - 1 VIEW  COMPARISON:  11/23/2013 and earlier.  FINDINGS: Portable AP semi upright view at 1339 hrs. The patient is less rotated. New small to moderate bilateral veiling pulmonary opacities. No pneumothorax. Pulmonary vascularity appears mildly increased, but without overt pulmonary edema. Stable cardiomegaly and mediastinal contours.  IMPRESSION: New small to moderate bilateral pleural effusions.   Electronically Signed   By: Lars Pinks M.D.   On: 11/26/2013 13:49   Dg Chest Port 1 View  11/23/2013   CLINICAL DATA:  Weakness, lethargic.  EXAM: PORTABLE CHEST - 1 VIEW  COMPARISON:  12/04/2012  FINDINGS: Aortic atherosclerosis and prominence. Cardiomegaly. Small effusions and bibasilar opacities, similar to prior. Diffuse osteopenia. Multilevel degenerative changes and osteopenia  IMPRESSION: Small effusions and bibasilar opacities ; atelectasis versus infiltrate.  Prominent cardiomediastinal contours, similar to prior.   Electronically Signed   By: Carlos Levering M.D.   On: 11/23/2013 22:53   Dg Foot Complete Left  11/23/2013   CLINICAL DATA:  Left great toe wound.  EXAM: LEFT FOOT - COMPLETE 3+  VIEW  COMPARISON:  None.  FINDINGS: Diffuse osteopenia. No displaced acute fracture or dislocation. No aggressive osseous lesion. Atherosclerotic vascular calcifications. Soft tissue irregularity overlies the calcaneus.  IMPRESSION: Diffuse osteopenia.  No acute osseous finding.  Soft tissue wound or ulceration overlies the calcaneus.  Consider MRI if concern for acute osteomyelitis persists.   Electronically Signed   By: Carlos Levering M.D.   On: 11/23/2013 22:56    Microbiology: Recent Results (from the past 240 hour(s))  URINE CULTURE     Status: None   Collection Time    11/23/13 10:45 PM      Result Value Ref Range Status   Specimen Description URINE, CATHETERIZED   Final   Special Requests NONE   Final   Culture  Setup Time     Final   Value: 11/23/2013 02:00     Performed at [facility]     Final   Value: >=100,000 COLONIES/ML     Performed at [facility]     Final  Value: PROTEUS MIRABILIS     Performed at [facility]   Report Status 11/26/2013 FINAL   Final   Organism ID, Bacteria PROTEUS MIRABILIS   Final  CULTURE, BLOOD (ROUTINE X 2)     Status: None   Collection Time    11/24/13 12:42 AM      Result Value Ref Range Status   Specimen Description BLOOD LEFT WRIST   Final   Special Requests BOTTLES DRAWN AEROBIC ONLY 10CC   Final   Culture  Setup Time     Final   Value: 11/24/2013 08:24     Performed at [facility]   Culture     Final   Value: NO GROWTH 5 DAYS     Performed at [facility]   Report Status 11/30/2013 FINAL   Final  CULTURE, BLOOD (ROUTINE X 2)     Status: None   Collection Time    11/24/13 12:45 AM      Result Value Ref Range Status   Specimen Description BLOOD RIGHT ARM   Final   Special Requests BOTTLES DRAWN AEROBIC AND ANAEROBIC 6CC   Final   Culture  Setup Time     Final   Value: 11/24/2013 08:25     Performed at [facility]   Culture     Final   Value: NO GROWTH  5 DAYS     Performed at [facility]   Report Status 11/30/2013 FINAL   Final     Labs: Basic Metabolic Panel:  Recent Labs Lab 11/24/13 1307 11/26/13 1310 11/27/13 0350 11/28/13 0438 11/29/13 0500  NA 145 148* 150* 141 144  K 3.8 3.7 3.8 3.5* 3.8  CL 115* 118* 118* 109 111  CO2 20 19 21 20 22   GLUCOSE 135* 103* 97 95 92  BUN 21 19 20 15 18   CREATININE 0.48* 0.45* 0.41* 0.39* 0.41*  CALCIUM 7.5* 7.7* 8.0* 7.7* 8.1*   Liver Function Tests:  Recent Labs Lab 11/23/13 2305  AST 21  ALT 12  ALKPHOS 70  BILITOT 0.4  PROT 5.9*  ALBUMIN 2.3*   No results found for this basename: LIPASE, AMYLASE,  in the last 168 hours No results found for this basename: AMMONIA,  in the last 168 hours CBC:  Recent Labs Lab 11/23/13 2305 11/26/13 1310 11/28/13 0438  WBC 9.9 11.5* 10.9*  NEUTROABS 7.7  --   --   HGB 11.6* 10.5* 10.9*  HCT 37.2 34.1* 35.1*  MCV 92.8 90.9 92.1  PLT 336 371 316   Cardiac Enzymes:  Recent Labs Lab 11/23/13 2305 11/24/13 0045 11/24/13 0809 11/24/13 1159  CKTOTAL 36  --   --   --   TROPONINI  --  <0.30 <0.30 <0.30   BNP: BNP (last 3 results)  Recent Labs  12/04/12 2039 12/05/12 0519  PROBNP 14051.0* 14473.0*   CBG: No results found for this basename: GLUCAP,  in the last 168 hours     Signed:  Lotta Frankenfield  [facility] 11/30/2013, 11:28 AM

## 2013-11-30 NOTE — Clinical Social Work Placement (Addendum)
Clinical Social Work Department CLINICAL SOCIAL WORK PLACEMENT NOTE 11/30/2013  Patient:  DARBY, FLEEMAN  Account Number:  [id number] Admit date:  11/23/2013  Clinical Social Worker:  Jatniel Verastegui Givens, LCSW  Date/time:  11/30/2013 12:09 PM  Clinical Social Work is seeking post-discharge placement for this patient at the following level of care:   SKILLED NURSING   (*CSW will update this form in Epic as items are completed)     Patient/family provided with [facility] Department of Clinical Social Work's list of facilities offering this level of care within the geographic area requested by the patient (or if unable, by the patient's family).  11/30/2013  Patient/family informed of their freedom to choose among providers that offer the needed level of care, that participate in Medicare, Medicaid or managed care program needed by the patient, have an available bed and are willing to accept the patient.    Patient/family informed of [facility]' ownership interest in [facility], as well as of the fact that they are under no obligation to receive care at this facility.  PASARR submitted to EDS in 2008  PASARR number received on in 2008 - [phone] [id number]  FL2 transmitted to all facilities in geographic area requested by pt/family on  11/29/2013 FL2 transmitted to all facilities within larger geographic area on   Patient informed that his/her managed care company has contracts with or will negotiate with  certain facilities, including the following:     Patient/family informed of bed offers received:  11/30/2013 Patient chooses bed at [facility]  Physician recommends and patient chooses bed at    Patient to be transferred to [facility] on 12/01/13   Patient to be transferred to facility by ambulance Patient and family notified of transfer on 11/30/13 Name of family member notified: Daughter Weston Fulco   The following physician request were entered in Epic:   Additional  Comments:

## 2013-11-30 NOTE — Clinical Social Work Psychosocial (Signed)
Clinical Social Work Department BRIEF PSYCHOSOCIAL ASSESSMENT 11/30/2013  Patient:  Julie Frank, Julie Frank     Account Number:  [id number]     Admit date:  11/23/2013  Clinical Social Worker:  Frederico Hamman  Date/Time:  11/30/2013 11:44 AM  Referred by:  Physician  Date Referred:  11/25/2013 Referred for  SNF Placement   Other Referral:   Interview type:  Family Other interview type:    PSYCHOSOCIAL DATA Living Status:  FACILITY Admitted from facility:  [facility] Level of care:  [place] Primary support name:  Ailed Defibaugh Primary support relationship to patient:  CHILD, ADULT Degree of support available:   Strong support. Daughter was primary caregiver prior to patient's admission to [facility]. She also is Media planner.    CURRENT CONCERNS Current Concerns  Post-Acute Placement   Other Concerns:    SOCIAL WORK ASSESSMENT / PLAN Patient is from [place] and has been there 4 years as a LTC patient. On 9/4, CSW talked very briefly by phone with daughter and she is unsure of the discharge plan. On 9/8 CSW and daughter talked at length regarding patient, the daughter's history of caregiving with her parents, and the current foreclosure of her home.    Ms. Ahn informed CSW (and it is in [place] notes) that she is currently going through foreclosure of her home, and that today it is being sold at the courthouse. Daughter explained that the home is her parent's home. Daughter explained that she moved in with her parents several years ago and that this was a good plan, as she helped care for them. Her dad eventually needed a SNF and was in [place] in 07/11/2003, and died in 11-Apr-2004. She then cared for her mom as her health began to decline and placed her at [facility] 4 years ago.    Daughter unsure if she wants patient to return to [place], but requested that they receive patient's information for consideration. Ms.  Chavira reported that she spoke with MD this morning and due to her current circumstances with the foreclosure, the patient will d/c on Wed. 9/9.   Assessment/plan status:  Psychosocial Support/Ongoing Assessment of Needs Other assessment/ plan:   Information/referral to community resources:   Patient provided with [employer].    PATIENT'S/FAMILY'S RESPONSE TO PLAN OF CARE: Daughter supportive, involved and proactive regarding finding an appropriate facility for her mother.

## 2013-12-01 MED ORDER — RESOURCE THICKENUP CLEAR PO POWD: ORAL | Status: DC

## 2013-12-01 MED ORDER — ENSURE COMPLETE PO LIQD: 237.0000 mL | Freq: Two times a day (BID) | ORAL | Status: DC

## 2013-12-01 MED ORDER — TRAMADOL HCL 50 MG PO TABS: 50.0000 mg | ORAL_TABLET | Freq: Four times a day (QID) | ORAL | Status: DC | PRN

## 2013-12-01 NOTE — Plan of Care (Signed)
Patient seen, having breakfast with assistance. No subjective complaints. Denies any headache chest or abdominal pain. No focal weakness.  Is wearing splints in both feet, has chronic ulcers in both feet. Will be discharged as per discharge summary by my partner Dr.Akula done yesterday. No change in plan. Patient and family do not want surgical correction of her PAD related lower extremity ulcers. Long-term prognosis is poor she is DO NOT RESUSCITATE.  Updated discharge instructions and medications as below.     Discharge Instructions  Follow with Primary MD Hennie Duos, MD in 7 days   Get CBC, CMP, 2 view Chest X ray checked  by Primary MD next visit.    Activity: As tolerated with Full fall precautions use walker/cane & assistance as needed   Disposition SNF   Diet: Nectar thick liquids with dysphasia 3 diet with full feeding assistance and aspiration precautions if needed.  For Heart failure patients - Check your Weight same time everyday, if you gain over 2 pounds, or you develop in leg swelling, experience more shortness of breath or chest pain, call your Primary MD immediately. Follow Cardiac Low Salt Diet and 1.8 lit/day fluid restriction.   On your next visit with her primary care physician please Get Medicines reviewed and adjusted.  Please request your Prim.MD to go over all Hospital Tests and Procedure/Radiological results at the follow up, please get all Hospital records sent to your Prim MD by signing hospital release before you go home.   If you experience worsening of your admission symptoms, develop shortness of breath, life threatening emergency, suicidal or homicidal thoughts you must seek medical attention immediately by calling 911 or calling your MD immediately  if symptoms less severe.  You Must read complete instructions/literature along with all the possible adverse reactions/side effects for all the Medicines you take and that have been prescribed to  you. Take any new Medicines after you have completely understood and accpet all the possible adverse reactions/side effects.   Do not drive, operating heavy machinery, perform activities at heights, swimming or participation in water activities or provide baby sitting services if your were admitted for syncope or siezures until you have seen by Primary MD or a Neurologist and advised to do so again.  Do not drive when taking Pain medications.    Do not take more than prescribed Pain, Sleep and Anxiety Medications  Special Instructions: If you have smoked or chewed Tobacco  in the last 2 yrs please stop smoking, stop any regular Alcohol  and or any Recreational drug use.  Wear Seat belts while driving.   Please note  You were cared for by a hospitalist during your hospital stay. If you have any questions about your discharge medications or the care you received while you were in the hospital after you are discharged, you can call the unit and asked to speak with the hospitalist on call if the hospitalist that took care of you is not available. Once you are discharged, your primary care physician will handle any further medical issues. Please note that NO REFILLS for any discharge medications will be authorized once you are discharged, as it is imperative that you return to your primary care physician (or establish a relationship with a primary care physician if you do not have one) for your aftercare needs so that they can reassess your need for medications and monitor your lab values.   Discharge Meds    Medication List  acetaminophen 325 MG tablet  Commonly known as:  TYLENOL  Take 650 mg by mouth every 12 (twelve) hours.     amLODipine 5 MG tablet  Commonly known as:  NORVASC  Take 5 mg by mouth daily.     aspirin EC 325 MG tablet  Take 325 mg by mouth daily.     CALCIUM 600 + D 600-200 MG-UNIT Tabs  Generic drug:  Calcium Carb-Cholecalciferol  Take 1 tablet by mouth 2  (two) times daily.     cephALEXin 500 MG capsule  Commonly known as:  KEFLEX  Take 500 mg by mouth 2 (two) times daily.     feeding supplement (ENSURE COMPLETE) Liqd  Take 237 mLs by mouth 2 (two) times daily between meals.     feeding supplement (PRO-STAT SUGAR FREE 64) Liqd  Take 30 mLs by mouth 2 (two) times daily.     levothyroxine 25 MCG tablet  Commonly known as:  SYNTHROID, LEVOTHROID  Take 25 mcg by mouth daily.     multivitamin capsule  Take 1 capsule by mouth daily.     omeprazole 20 MG capsule  Commonly known as:  PRILOSEC  Take 20 mg by mouth daily.     RESOURCE THICKENUP CLEAR Powd  To pick him up liquids with each meal and otherwise     simvastatin 20 MG tablet  Commonly known as:  ZOCOR  Take 20 mg by mouth every evening.     traMADol 50 MG tablet  Commonly known as:  ULTRAM  Take 1 tablet (50 mg total) by mouth every 6 (six) hours as needed for moderate pain.        Thurnell Lose M.D on 12/01/2013 at 10:36 AM  Between 7am to 7pm - Pager - [phone], After 7pm go to [email or web address] - password [email or web address]  And look for the night coverage person covering me after hours  [facility]  Office  [phone]

## 2013-12-01 NOTE — Progress Notes (Signed)
Discharge education completed by RN. Pt and daughter received a copy of discharge paperwork and confirm understanding of follow up appointments and discharge medications. Both deny any questions at this time. IV removed, site is within normal limits. Report called to [facility], RN gave report to night supervisor , Armed forces training and education officer. Pt will discharge from the unit via ambulance.

## 2013-12-01 NOTE — Discharge Instructions (Signed)
Follow with Primary MD Hennie Duos, MD in 7 days   Get CBC, CMP, 2 view Chest X ray checked  by Primary MD next visit.    Activity: As tolerated with Full fall precautions use walker/cane & assistance as needed   Disposition SNF   Diet: Nectar thick liquids with dysphasia 3 diet with full feeding assistance and aspiration precautions if needed.  For Heart failure patients - Check your Weight same time everyday, if you gain over 2 pounds, or you develop in leg swelling, experience more shortness of breath or chest pain, call your Primary MD immediately. Follow Cardiac Low Salt Diet and 1.8 lit/day fluid restriction.   On your next visit with her primary care physician please Get Medicines reviewed and adjusted.  Please request your Prim.MD to go over all Hospital Tests and Procedure/Radiological results at the follow up, please get all Hospital records sent to your Prim MD by signing hospital release before you go home.   If you experience worsening of your admission symptoms, develop shortness of breath, life threatening emergency, suicidal or homicidal thoughts you must seek medical attention immediately by calling 911 or calling your MD immediately  if symptoms less severe.  You Must read complete instructions/literature along with all the possible adverse reactions/side effects for all the Medicines you take and that have been prescribed to you. Take any new Medicines after you have completely understood and accpet all the possible adverse reactions/side effects.   Do not drive, operating heavy machinery, perform activities at heights, swimming or participation in water activities or provide baby sitting services if your were admitted for syncope or siezures until you have seen by Primary MD or a Neurologist and advised to do so again.  Do not drive when taking Pain medications.    Do not take more than prescribed Pain, Sleep and Anxiety Medications  Special Instructions: If  you have smoked or chewed Tobacco  in the last 2 yrs please stop smoking, stop any regular Alcohol  and or any Recreational drug use.  Wear Seat belts while driving.   Please note  You were cared for by a hospitalist during your hospital stay. If you have any questions about your discharge medications or the care you received while you were in the hospital after you are discharged, you can call the unit and asked to speak with the hospitalist on call if the hospitalist that took care of you is not available. Once you are discharged, your primary care physician will handle any further medical issues. Please note that NO REFILLS for any discharge medications will be authorized once you are discharged, as it is imperative that you return to your primary care physician (or establish a relationship with a primary care physician if you do not have one) for your aftercare needs so that they can reassess your need for medications and monitor your lab values.

## 2013-12-03 ENCOUNTER — Emergency Department (HOSPITAL_COMMUNITY): Payer: Medicare Other

## 2013-12-03 ENCOUNTER — Encounter (HOSPITAL_COMMUNITY): Payer: Self-pay | Admitting: Emergency Medicine

## 2013-12-03 ENCOUNTER — Inpatient Hospital Stay (HOSPITAL_COMMUNITY)
Admission: EM | Admit: 2013-12-03 | Discharge: 2013-12-10 | DRG: 871 | Disposition: A | Discharge status: Hospice | Payer: Medicare Other | Attending: Internal Medicine | Admitting: Internal Medicine

## 2013-12-03 DIAGNOSIS — M1990 Unspecified osteoarthritis, unspecified site: Secondary | ICD-10-CM | POA: Diagnosis present

## 2013-12-03 DIAGNOSIS — I5032 Chronic diastolic (congestive) heart failure: Secondary | ICD-10-CM

## 2013-12-03 DIAGNOSIS — IMO0002 Reserved for concepts with insufficient information to code with codable children: Secondary | ICD-10-CM

## 2013-12-03 DIAGNOSIS — R4182 Altered mental status, unspecified: Secondary | ICD-10-CM | POA: Diagnosis present

## 2013-12-03 DIAGNOSIS — Z9849 Cataract extraction status, unspecified eye: Secondary | ICD-10-CM

## 2013-12-03 DIAGNOSIS — A419 Sepsis, unspecified organism: Secondary | ICD-10-CM | POA: Diagnosis present

## 2013-12-03 DIAGNOSIS — G9341 Metabolic encephalopathy: Secondary | ICD-10-CM | POA: Diagnosis present

## 2013-12-03 DIAGNOSIS — R531 Weakness: Secondary | ICD-10-CM

## 2013-12-03 DIAGNOSIS — R5381 Other malaise: Secondary | ICD-10-CM | POA: Diagnosis present

## 2013-12-03 DIAGNOSIS — E43 Unspecified severe protein-calorie malnutrition: Secondary | ICD-10-CM | POA: Diagnosis present

## 2013-12-03 DIAGNOSIS — D72829 Elevated white blood cell count, unspecified: Secondary | ICD-10-CM | POA: Diagnosis present

## 2013-12-03 DIAGNOSIS — I872 Venous insufficiency (chronic) (peripheral): Secondary | ICD-10-CM | POA: Diagnosis present

## 2013-12-03 DIAGNOSIS — H9190 Unspecified hearing loss, unspecified ear: Secondary | ICD-10-CM | POA: Diagnosis present

## 2013-12-03 DIAGNOSIS — J9600 Acute respiratory failure, unspecified whether with hypoxia or hypercapnia: Secondary | ICD-10-CM | POA: Diagnosis present

## 2013-12-03 DIAGNOSIS — M86679 Other chronic osteomyelitis, unspecified ankle and foot: Secondary | ICD-10-CM | POA: Diagnosis present

## 2013-12-03 DIAGNOSIS — Z79899 Other long term (current) drug therapy: Secondary | ICD-10-CM | POA: Diagnosis not present

## 2013-12-03 DIAGNOSIS — Z961 Presence of intraocular lens: Secondary | ICD-10-CM

## 2013-12-03 DIAGNOSIS — R509 Fever, unspecified: Secondary | ICD-10-CM

## 2013-12-03 DIAGNOSIS — R400 Somnolence: Secondary | ICD-10-CM

## 2013-12-03 DIAGNOSIS — M810 Age-related osteoporosis without current pathological fracture: Secondary | ICD-10-CM | POA: Diagnosis present

## 2013-12-03 DIAGNOSIS — D638 Anemia in other chronic diseases classified elsewhere: Secondary | ICD-10-CM | POA: Diagnosis present

## 2013-12-03 DIAGNOSIS — E785 Hyperlipidemia, unspecified: Secondary | ICD-10-CM | POA: Diagnosis present

## 2013-12-03 DIAGNOSIS — L97409 Non-pressure chronic ulcer of unspecified heel and midfoot with unspecified severity: Secondary | ICD-10-CM | POA: Diagnosis present

## 2013-12-03 DIAGNOSIS — Z8673 Personal history of transient ischemic attack (TIA), and cerebral infarction without residual deficits: Secondary | ICD-10-CM

## 2013-12-03 DIAGNOSIS — L02619 Cutaneous abscess of unspecified foot: Secondary | ICD-10-CM | POA: Diagnosis present

## 2013-12-03 DIAGNOSIS — L97419 Non-pressure chronic ulcer of right heel and midfoot with unspecified severity: Secondary | ICD-10-CM

## 2013-12-03 DIAGNOSIS — I509 Heart failure, unspecified: Secondary | ICD-10-CM | POA: Diagnosis present

## 2013-12-03 DIAGNOSIS — J690 Pneumonitis due to inhalation of food and vomit: Secondary | ICD-10-CM | POA: Diagnosis present

## 2013-12-03 DIAGNOSIS — R627 Adult failure to thrive: Secondary | ICD-10-CM | POA: Diagnosis present

## 2013-12-03 DIAGNOSIS — I96 Gangrene, not elsewhere classified: Secondary | ICD-10-CM | POA: Diagnosis present

## 2013-12-03 DIAGNOSIS — Z66 Do not resuscitate: Secondary | ICD-10-CM | POA: Diagnosis present

## 2013-12-03 DIAGNOSIS — Z7982 Long term (current) use of aspirin: Secondary | ICD-10-CM | POA: Diagnosis not present

## 2013-12-03 DIAGNOSIS — F0150 Vascular dementia without behavioral disturbance: Secondary | ICD-10-CM | POA: Diagnosis present

## 2013-12-03 DIAGNOSIS — I672 Cerebral atherosclerosis: Secondary | ICD-10-CM | POA: Diagnosis present

## 2013-12-03 DIAGNOSIS — R1310 Dysphagia, unspecified: Secondary | ICD-10-CM | POA: Diagnosis present

## 2013-12-03 DIAGNOSIS — Z515 Encounter for palliative care: Secondary | ICD-10-CM

## 2013-12-03 DIAGNOSIS — L03119 Cellulitis of unspecified part of limb: Secondary | ICD-10-CM

## 2013-12-03 DIAGNOSIS — D473 Essential (hemorrhagic) thrombocythemia: Secondary | ICD-10-CM | POA: Diagnosis present

## 2013-12-03 DIAGNOSIS — R404 Transient alteration of awareness: Secondary | ICD-10-CM

## 2013-12-03 DIAGNOSIS — I5033 Acute on chronic diastolic (congestive) heart failure: Secondary | ICD-10-CM | POA: Diagnosis present

## 2013-12-03 DIAGNOSIS — I10 Essential (primary) hypertension: Secondary | ICD-10-CM | POA: Diagnosis present

## 2013-12-03 DIAGNOSIS — I739 Peripheral vascular disease, unspecified: Secondary | ICD-10-CM | POA: Diagnosis present

## 2013-12-03 DIAGNOSIS — E039 Hypothyroidism, unspecified: Secondary | ICD-10-CM | POA: Diagnosis present

## 2013-12-03 DIAGNOSIS — B999 Unspecified infectious disease: Secondary | ICD-10-CM | POA: Diagnosis present

## 2013-12-03 DIAGNOSIS — R6520 Severe sepsis without septic shock: Secondary | ICD-10-CM | POA: Diagnosis present

## 2013-12-03 LAB — CBC WITH DIFFERENTIAL/PLATELET
BASOS ABS: 0 10*3/uL (ref 0.0–0.1)
BASOS PCT: 0 % (ref 0–1)
EOS ABS: 0 10*3/uL (ref 0.0–0.7)
EOS PCT: 0 % (ref 0–5)
HEMATOCRIT: 35.8 % — AB (ref 36.0–46.0)
Hemoglobin: 11.4 g/dL — ABNORMAL LOW (ref 12.0–15.0)
Lymphocytes Relative: 7 % — ABNORMAL LOW (ref 12–46)
Lymphs Abs: 0.9 10*3/uL (ref 0.7–4.0)
MCH: 28.9 pg (ref 26.0–34.0)
MCHC: 31.8 g/dL (ref 30.0–36.0)
MCV: 90.9 fL (ref 78.0–100.0)
Monocytes Absolute: 0.8 10*3/uL (ref 0.1–1.0)
Monocytes Relative: 7 % (ref 3–12)
Neutro Abs: 11 10*3/uL — ABNORMAL HIGH (ref 1.7–7.7)
Neutrophils Relative %: 86 % — ABNORMAL HIGH (ref 43–77)
Platelets: 416 10*3/uL — ABNORMAL HIGH (ref 150–400)
RBC: 3.94 MIL/uL (ref 3.87–5.11)
RDW: 16.3 % — AB (ref 11.5–15.5)
WBC: 12.9 10*3/uL — ABNORMAL HIGH (ref 4.0–10.5)

## 2013-12-03 LAB — BASIC METABOLIC PANEL
ANION GAP: 11 (ref 5–15)
BUN: 20 mg/dL (ref 6–23)
CALCIUM: 8.5 mg/dL (ref 8.4–10.5)
CO2: 24 mEq/L (ref 19–32)
CREATININE: 0.51 mg/dL (ref 0.50–1.10)
Chloride: 108 mEq/L (ref 96–112)
GFR calc non Af Amer: 75 mL/min — ABNORMAL LOW (ref >90)
GFR, EST AFRICAN AMERICAN: 87 mL/min — AB (ref >90)
Glucose, Bld: 98 mg/dL (ref 70–99)
Potassium: 4.4 mEq/L (ref 3.7–5.3)
Sodium: 143 mEq/L (ref 137–147)

## 2013-12-03 LAB — LACTIC ACID, PLASMA: LACTIC ACID, VENOUS: 1.3 mmol/L (ref 0.5–2.2)

## 2013-12-03 LAB — TROPONIN I: Troponin I: <0.3 ng/mL (ref <0.30)

## 2013-12-03 LAB — SEDIMENTATION RATE: Sed Rate: 107 mm/hr — ABNORMAL HIGH (ref 0–22)

## 2013-12-03 MED ORDER — ACETAMINOPHEN 325 MG PO TABS: 650.0000 mg | ORAL_TABLET | Freq: Four times a day (QID) | ORAL | Status: DC | PRN

## 2013-12-03 MED ORDER — ENOXAPARIN SODIUM 30 MG/0.3ML ~~LOC~~ SOLN
30.0000 mg | SUBCUTANEOUS | Status: DC
  Administered 2013-12-03 – 2013-12-09 (×7): 30 mg via SUBCUTANEOUS
  Filled 2013-12-03 (×8): qty 0.3

## 2013-12-03 MED ORDER — ONDANSETRON HCL 4 MG PO TABS: 4.0000 mg | ORAL_TABLET | Freq: Four times a day (QID) | ORAL | Status: DC | PRN

## 2013-12-03 MED ORDER — MORPHINE SULFATE 2 MG/ML IJ SOLN: 2.0000 mg | INTRAMUSCULAR | Status: DC | PRN

## 2013-12-03 MED ORDER — VANCOMYCIN HCL IN DEXTROSE 1-5 GM/200ML-% IV SOLN
1000.0000 mg | Freq: Once | INTRAVENOUS | Status: AC
  Administered 2013-12-03: 1000 mg via INTRAVENOUS
  Filled 2013-12-03: qty 200

## 2013-12-03 MED ORDER — ONDANSETRON HCL 4 MG/2ML IJ SOLN: 4.0000 mg | Freq: Four times a day (QID) | INTRAMUSCULAR | Status: DC | PRN

## 2013-12-03 MED ORDER — LEVOTHYROXINE SODIUM 100 MCG IV SOLR
12.5000 ug | Freq: Every day | INTRAVENOUS | Status: DC
  Administered 2013-12-03 – 2013-12-05 (×3): 12.5 ug via INTRAVENOUS
  Filled 2013-12-03 (×4): qty 5

## 2013-12-03 MED ORDER — PIPERACILLIN-TAZOBACTAM 3.375 G IVPB
3.3750 g | Freq: Three times a day (TID) | INTRAVENOUS | Status: DC
  Administered 2013-12-03 – 2013-12-10 (×19): 3.375 g via INTRAVENOUS
  Filled 2013-12-03 (×21): qty 50

## 2013-12-03 MED ORDER — PIPERACILLIN-TAZOBACTAM 3.375 G IVPB 30 MIN
3.3750 g | Freq: Once | INTRAVENOUS | Status: AC
  Administered 2013-12-03: 3.375 g via INTRAVENOUS
  Filled 2013-12-03: qty 50

## 2013-12-03 MED ORDER — ACETAMINOPHEN 650 MG RE SUPP: 650.0000 mg | Freq: Four times a day (QID) | RECTAL | Status: DC | PRN

## 2013-12-03 MED ORDER — ASPIRIN 300 MG RE SUPP
300.0000 mg | Freq: Every day | RECTAL | Status: DC
  Administered 2013-12-03 – 2013-12-05 (×3): 300 mg via RECTAL
  Filled 2013-12-03 (×4): qty 1

## 2013-12-03 MED ORDER — SODIUM CHLORIDE 0.9 % IV BOLUS (SEPSIS)
1000.0000 mL | Freq: Once | INTRAVENOUS | Status: AC
Start: 2013-12-03 — End: 2013-12-03
  Administered 2013-12-03: 1000 mL via INTRAVENOUS

## 2013-12-03 MED ORDER — VANCOMYCIN HCL IN DEXTROSE 1-5 GM/200ML-% IV SOLN
1000.0000 mg | Freq: Once | INTRAVENOUS | Status: DC
  Filled 2013-12-03: qty 200

## 2013-12-03 MED ORDER — VANCOMYCIN HCL IN DEXTROSE 1-5 GM/200ML-% IV SOLN
1000.0000 mg | INTRAVENOUS | Status: DC
  Administered 2013-12-05 – 2013-12-09 (×3): 1000 mg via INTRAVENOUS
  Filled 2013-12-03 (×3): qty 200

## 2013-12-03 MED ORDER — SODIUM CHLORIDE 0.9 % IV SOLN
INTRAVENOUS | Status: DC
  Administered 2013-12-03 – 2013-12-04 (×2): via INTRAVENOUS

## 2013-12-03 NOTE — H&P (Signed)
History and Physical:    Julie Frank WER:[id number] DOB: 1909/04/24 DOA: 12/03/2013  Referring physician: Dr. Houston Siren, III PCP: Hennie Duos, MD   Chief Complaint: Altered mental status, low grade fever, worsening heel wound  History of Present Illness:   Julie Frank is an 80 y.o. female with multiple medical problems as detailed under PMH, hospitalized 11/23/13-11/30/13 with sepsis from a urinary source (urine culture from 11/23/13 grew Proteus mirabilis) as well as possible chronic osteomyelitis secondary to a chronic heel wound in the setting of PVD, and was evaluated by Dr. Donnetta Hutching with recommendations for a AKA, which the patient/family declined.  She was seen by the palliative care team, with DNR status confirmed, but with wish for ongoing treatment for infection/illness.  There is currently no family at the bedside, and the patient's altered mental status precludes obtaining any additional history from her. She does open her eyes to stimulation and can answer simple questions such as telling me she is not in pain, but unable to do a review of systems beyond this. Attempts to reach the patient's daughter, [name] at [phone] not successful.  ROS:   Unable to obtain secondary to the patient's altered mental status.   Past Medical History:   Past Medical History  Diagnosis Date  . Mitral insufficiency     CHRONIC  . Hypertension   . Debility     GENERALIZED  . OA (osteoarthritis)   . OP (osteoporosis)   . CHF (congestive heart failure)   . History of diastolic dysfunction   . Muscle weakness (generalized)   . Difficulty walking   . Metabolic encephalopathy   . Hypothyroidism   . HOH (hard of hearing)     "extremely"  . SVT (supraventricular tachycardia)     nonsustained  . Heart murmur   . Repeated falls 2010    "3 serious falls; hit head each time; staples 1st 2 times"  . Vascular dementia dx'd 2011  . Peripheral vascular disease   . Dry cough  03/26/11    "chronic; dx'd years ago as allergy related type of thing"  . Bruises easily   . Chronic back pain greater than 3 months duration   . CVA (cerebrovascular accident) 03/2006; 04/2009    residual "speech problems & short term memory decreased"  . PNA (pneumonia)   . UTI (lower urinary tract infection)   . Hyperlipidemia     Past Surgical History:   Past Surgical History  Procedure Laterality Date  . Removal colon polpys  12/2000    Villous adenoma of the cecum.  . Tonsillectomy and adenoidectomy    . Appendectomy    . Cataract extraction w/ intraocular lens  implant, bilateral  1984  . Dilation and curettage of uterus  1960's    "several"    Social History:   History   Social History  . Marital Status: Single    Spouse Name: N/A    Number of Children: 1  . Years of Education: N/A   Occupational History  . Network engineer     retired   Social History Main Topics  . Smoking status: Never Smoker   . Smokeless tobacco: Never Used  . Alcohol Use: No  . Drug Use: No  . Sexual Activity: Not Currently   Other Topics Concern  . Not on file   Social History Narrative  . No narrative on file    Family history:   Family History  Problem Relation Age  of Onset  . Heart disease Mother     Allergies   Review of patient's allergies indicates no known allergies.  Current Medications:   Prior to Admission medications   Medication Sig Start Date End Date Taking? Authorizing Provider  acetaminophen (TYLENOL) 325 MG tablet Take 650 mg by mouth every 12 (twelve) hours.   Yes Historical Provider, MD  amLODipine (NORVASC) 5 MG tablet Take 5 mg by mouth every morning.    Yes Historical Provider, MD  aspirin EC 325 MG tablet Take 325 mg by mouth every morning.    Yes Historical Provider, MD  Calcium Carb-Cholecalciferol (CALCIUM 600 + D) 600-200 MG-UNIT TABS Take 1 tablet by mouth 2 (two) times daily.    Yes Historical Provider, MD  levothyroxine (SYNTHROID, LEVOTHROID) 25  MCG tablet Take 25 mcg by mouth daily before breakfast.    Yes Historical Provider, MD  Multiple Vitamin (MULTIVITAMIN WITH MINERALS) TABS tablet Take 1 tablet by mouth every morning.   Yes Historical Provider, MD  omeprazole (PRILOSEC) 20 MG capsule Take 20 mg by mouth every morning.    Yes Historical Provider, MD  PRESCRIPTION MEDICATION Take 60 mLs by mouth 2 (two) times daily between meals. Med Pass   Yes Historical Provider, MD  Protein (PROSOURCE PO) Take 30 mLs by mouth 2 (two) times daily.   Yes Historical Provider, MD  traMADol (ULTRAM) 50 MG tablet Take 1 tablet (50 mg total) by mouth every 6 (six) hours as needed for moderate pain. 12/01/13  Yes Thurnell Lose, MD    Physical Exam:   Filed Vitals:   12/03/13 1449 12/03/13 1500 12/03/13 1600 12/03/13 1605  BP:   125/61   Pulse:   77   Temp: 99.1 F (37.3 C)   97.9 F (36.6 C)  TempSrc: Rectal   Oral  Resp:   23   Height:  4' 11.84" (1.52 m)    Weight:  52.6 kg (115 lb 15.4 oz)    SpO2:   96%      Physical Exam: Blood pressure 125/61, pulse 77, temperature 97.9 F (36.6 C), temperature source Oral, resp. rate 23, height 4' 11.84" (1.52 m), weight 52.6 kg (115 lb 15.4 oz), SpO2 96.00%. Gen: No acute distress.  Somnolent.   Head: Normocephalic, atraumatic. Eyes: PERRL, EOMI, sclerae nonicteric. Mouth: Oropharynx with dry mucous membranes. Neck: Supple, no thyromegaly, no lymphadenopathy, no jugular venous distention. Chest: Lungs diminished but clear anteriorly. CV: Heart sounds are regular. No murmurs, rubs, or gallops. Abdomen: Soft, nontender, nondistended with normal active bowel sounds. Extremities: Extremities are without clubbing, edema, or cyanosis. Right heel wound is pictured below. Skin: Warm and dry. Thick eschar to right heel, with foul-smelling drainage is pictured below. Neuro: Lethargic but awakens to voice, disoriented; cranial nerves II through XII grossly intact. Psych: Mood and affect normal.  Right  heel:   Right medial foot:     Data Review:    Labs: Basic Metabolic Panel:  Recent Labs Lab 11/27/13 0350 11/28/13 0438 11/29/13 0500 12/03/13 1407  NA 150* 141 144 143  K 3.8 3.5* 3.8 4.4  CL 118* 109 111 108  CO2 _0 GLUCOSE 97 95 92 98  BUN _1 CREATININE 0.41* 0.39* 0.41* 0.51  CALCIUM 8.0* 7.7* 8.1* 8.5   CBC:  Recent Labs Lab 11/28/13 0438 12/03/13 1407  WBC 10.9* 12.9*  NEUTROABS  --  11.0*  HGB 10.9* 11.4*  HCT 35.1* 35.8*  MCV 92.1  90.9  PLT 316 416*   Cardiac Enzymes:  Recent Labs Lab 12/03/13 1407  TROPONINI <0.30    BNP (last 3 results)  Recent Labs  12/04/12 2039 12/05/12 0519  PROBNP 14051.0* 14473.0*   CBG: No results found for this basename: GLUCAP,  in the last 168 hours  Radiographic Studies: Dg Chest Port 1 View  12/03/2013   CLINICAL DATA:  Tachypnea. History of hypertension, CHF, altered level of consciousness.  EXAM: PORTABLE CHEST - 1 VIEW  COMPARISON:  11/30/2013  FINDINGS: Heart is enlarged. There are bilateral pleural effusions. Bibasilar opacities obscure the hemidiaphragms. There is increased perihilar infiltrate.  IMPRESSION: 1. Cardiomegaly and increasing airspace filling, suggestive of increased pulmonary edema and or infectious process. 2. Persistent bilateral pleural effusions.   Electronically Signed   By: Shon Hale M.D.   On: 12/03/2013 14:30      Assessment/Plan:   Principal Problem:   Chronic heel ulcer with probable underlying osteomyelitis/chronic infection in the setting of chronic infections in adult failure to thrive  The patient has a chronic heel wound and recent hospitalization for Proteus mirabilis UTI along with other frequent infections including pneumonia. She is failing to thrive. She was seen by the palliative care team during her previous admission and family opted to continue aggressive care despite recommendations to have a right AKA for definitive treatment. She is now  back with a smoldering cellulitis with probable underlying osteomyelitis. Check ESR.   Treat infection with vancomycin and Zosyn given her recent hospitalization.  Re-consult palliative care for ongoing discussion of goals of care.  Followup blood cultures.  Active Problems:   PAD (peripheral artery disease)  Seen by Dr. Donnetta Hutching during previous hospital stay. An AKA of her right lower extremity was recommended at that time, however the patient and family declines this.  Give aspirin through rectal route until mental status clears and she can safely swallow medications.    Altered mental status  Likely secondary to smoldering infection.  Check lactic acid to rule out early sepsis.  N.p.o. until patient more alert.    Protein-calorie malnutrition, severe  Dietitian consultation requested.    DVT prophylaxis  Lovenox ordered.  Code Status: Full. Family Communication: Unable to reach family. Disposition Plan: Home when stable.  Time spent: 1 hour.  Arleigh Dicola [facility] Pager [phone] Cell: [phone]   If 7PM-7AM, please contact night-coverage [email or web address] Password [facility] 12/03/2013, 4:19 PM

## 2013-12-03 NOTE — ED Notes (Addendum)
Per EMS: Pt is from [facility], of which she was transferred there two days ago. Pt has has a left heel wound that the staff is concerned has developed gangrene tissue. Facility reports decreased mental status. Pt has a DNR. Pt white count went from 10.2 to 16 over the past two days.

## 2013-12-03 NOTE — ED Provider Notes (Signed)
CSN: [id number]     Arrival date & time 12/03/13  1142 History   First MD Initiated Contact with Patient 12/03/13 1244     Chief Complaint  Patient presents with  . Wound Check     (Consider location/radiation/quality/duration/timing/severity/associated sxs/prior Treatment) Patient is a 80 y.o. female presenting with altered mental status.  Altered Mental Status Presenting symptoms: confusion and lethargy   Severity:  Moderate Most recent episode:  Today Episode history:  Continuous Duration:  1 day Timing:  Constant Progression:  Unchanged Chronicity:  Recurrent (Similar symptoms last week. Just discharged from the hospital 2 days ago.) Associated symptoms: difficulty breathing and fever   Associated symptoms: no abdominal pain   Associated symptoms comment:  Worsening left heel wound   Past Medical History  Diagnosis Date  . Mitral insufficiency     CHRONIC  . Hypertension   . Debility     GENERALIZED  . OA (osteoarthritis)   . OP (osteoporosis)   . CHF (congestive heart failure)   . History of diastolic dysfunction   . Muscle weakness (generalized)   . Difficulty walking   . Metabolic encephalopathy   . Hypothyroidism   . HOH (hard of hearing)     "extremely"  . SVT (supraventricular tachycardia)     nonsustained  . Heart murmur   . Repeated falls 2010    "3 serious falls; hit head each time; staples 1st 2 times"  . Vascular dementia dx'd 2011  . Peripheral vascular disease   . Dry cough 03/26/11    "chronic; dx'd years ago as allergy related type of thing"  . Bruises easily   . Chronic back pain greater than 3 months duration   . CVA (cerebrovascular accident) 03/2006; 04/2009    residual "speech problems & short term memory decreased"  . PNA (pneumonia)   . UTI (lower urinary tract infection)   . Hyperlipidemia    Past Surgical History  Procedure Laterality Date  . Removal colon polpys  12/2000    Villous adenoma of the cecum.  . Tonsillectomy and  adenoidectomy    . Appendectomy    . Cataract extraction w/ intraocular lens  implant, bilateral  1984  . Dilation and curettage of uterus  1960's    "several"   Family History  Problem Relation Age of Onset  . Heart disease Mother    History  Substance Use Topics  . Smoking status: Never Smoker   . Smokeless tobacco: Never Used  . Alcohol Use: No   OB History   Grav Para Term Preterm Abortions TAB SAB Ect Mult Living                 Review of Systems  Unable to perform ROS: Mental status change  Constitutional: Positive for fever.  Gastrointestinal: Negative for abdominal pain.  Psychiatric/Behavioral: Positive for confusion.      Allergies  Review of patient's allergies indicates no known allergies.  Home Medications   Prior to Admission medications   Medication Sig Start Date End Date Taking? Authorizing Provider  acetaminophen (TYLENOL) 325 MG tablet Take 650 mg by mouth every 12 (twelve) hours.   Yes Historical Provider, MD  Amino Acids-Protein Hydrolys (FEEDING SUPPLEMENT, PRO-STAT SUGAR FREE 64,) LIQD Take 30 mLs by mouth 2 (two) times daily.   Yes Historical Provider, MD  amLODipine (NORVASC) 5 MG tablet Take 5 mg by mouth daily.   Yes Historical Provider, MD  aspirin EC 325 MG tablet Take 325 mg by mouth  daily.   Yes Historical Provider, MD  Calcium Carb-Cholecalciferol (CALCIUM 600 + D) 600-200 MG-UNIT TABS Take 1 tablet by mouth 2 (two) times daily.    Yes Historical Provider, MD  cephALEXin (KEFLEX) 500 MG capsule Take 500 mg by mouth 2 (two) times daily.   Yes Historical Provider, MD  levothyroxine (SYNTHROID, LEVOTHROID) 25 MCG tablet Take 25 mcg by mouth daily.     Yes Historical Provider, MD  Multiple Vitamin (MULTIVITAMIN) capsule Take 1 capsule by mouth daily.   Yes Historical Provider, MD  omeprazole (PRILOSEC) 20 MG capsule Take 20 mg by mouth daily.   Yes Historical Provider, MD  simvastatin (ZOCOR) 20 MG tablet Take 20 mg by mouth every evening.    Yes Historical Provider, MD  traMADol (ULTRAM) 50 MG tablet Take 1 tablet (50 mg total) by mouth every 6 (six) hours as needed for moderate pain. 12/01/13  Yes Thurnell Lose, MD  feeding supplement, ENSURE COMPLETE, (ENSURE COMPLETE) LIQD Take 237 mLs by mouth 2 (two) times daily between meals. 12/01/13   Thurnell Lose, MD  Maltodextrin-Xanthan Gum ([place]) POWD To pick him up liquids with each meal and otherwise 12/01/13   Thurnell Lose, MD   BP 111/59  Pulse 66  Temp(Src) 98.1 F (36.7 C) (Oral)  Resp 14  SpO2 94% Physical Exam  Nursing note and vitals reviewed. Constitutional: She is oriented to person, place, and time. She appears well-developed and well-nourished. No distress.  HENT:  Head: Normocephalic and atraumatic.  Mouth/Throat: Oropharynx is clear and moist.  Eyes: Conjunctivae are normal. Pupils are equal, round, and reactive to light. No scleral icterus.  Neck: Neck supple.  Cardiovascular: Normal rate, regular rhythm, normal heart sounds and intact distal pulses.   No murmur heard. Pulmonary/Chest: Effort normal and breath sounds normal. No stridor. No respiratory distress. She has no rales.  Abdominal: Soft. Bowel sounds are normal. She exhibits no distension. There is no tenderness.  Musculoskeletal: Normal range of motion.  Neurological: She is oriented to person, place, and time.  Sleeping but arousable to voice and touch.  Skin: Skin is warm and dry. No rash noted.  Large wet, necrotic, and malodorous wound of left foot.  Psychiatric: She has a normal mood and affect. Her behavior is normal.    ED Course  Procedures (including critical care time) Labs Review Labs Reviewed  CBC WITH DIFFERENTIAL - Abnormal; Notable for the following:    WBC 12.9 (*)    Hemoglobin 11.4 (*)    HCT 35.8 (*)    RDW 16.3 (*)    Platelets 416 (*)    Neutrophils Relative % 86 (*)    Neutro Abs 11.0 (*)    Lymphocytes Relative 7 (*)    All other components  within normal limits  BASIC METABOLIC PANEL - Abnormal; Notable for the following:    GFR calc non Af Amer 75 (*)    GFR calc Af Amer 87 (*)    All other components within normal limits  SEDIMENTATION RATE - Abnormal; Notable for the following:    Sed Rate 107 (*)    All other components within normal limits  CULTURE, BLOOD (ROUTINE X 2)  CULTURE, BLOOD (ROUTINE X 2)  URINE CULTURE  TROPONIN I  LACTIC ACID, PLASMA  URINALYSIS, ROUTINE W REFLEX MICROSCOPIC    Imaging Review Dg Chest Port 1 View  12/03/2013   CLINICAL DATA:  Tachypnea. History of hypertension, CHF, altered level of consciousness.  EXAM:  PORTABLE CHEST - 1 VIEW  COMPARISON:  11/30/2013  FINDINGS: Heart is enlarged. There are bilateral pleural effusions. Bibasilar opacities obscure the hemidiaphragms. There is increased perihilar infiltrate.  IMPRESSION: 1. Cardiomegaly and increasing airspace filling, suggestive of increased pulmonary edema and or infectious process. 2. Persistent bilateral pleural effusions.   Electronically Signed   By: Shon Hale M.D.   On: 12/03/2013 14:30  All radiology studies independently viewed by me.      EKG Interpretation None      MDM   Final diagnoses:  Chronic heel ulcer, right, with unspecified severity  Adult failure to thrive  Somnolence  PAD (peripheral artery disease)  Protein-calorie malnutrition, severe    80 year old female who was recently discharged from the hospital secondary to sepsis secondary to a left heel chronic wound. Today, she developed fevers, confusion, and worsening of her heel wound. She was referred back to the emergency department from her rehabilitation facility.  Treated with vanco and zosyn.  Fluids stopped after 250cc.  Consulted hospitalist for admission.      Houston Siren III, MD 12/03/13 [phone]

## 2013-12-03 NOTE — ED Notes (Signed)
Assisted  Gwyndolyn Saxon with an in and out cath, pt had no urine to obtain at this time.

## 2013-12-03 NOTE — Progress Notes (Signed)
ANTIBIOTIC CONSULT NOTE - INITIAL  Pharmacy Consult for vancomycin, Zosyn Indication: wound infection  No Known Allergies  Patient Measurements: Height: 4' 11.84" (152 cm) Weight: 115 lb 15.4 oz (52.6 kg) IBW/kg (Calculated) : 45.14  Vital Signs: Temp: 99.1 F (37.3 C) (09/11 1449) Temp src: Rectal (09/11 1449) BP: 111/59 mmHg (09/11 1204) Pulse Rate: 66 (09/11 1204) Intake/Output from previous day:   Intake/Output from this shift:    Labs:  Recent Labs  12/03/13 1407  WBC 12.9*  HGB 11.4*  PLT 416*  CREATININE 0.51   Estimated Creatinine Clearance: 25.3 ml/min (by C-G formula based on Cr of 0.51). No results found for this basename: VANCOTROUGH, Corlis Leak, VANCORANDOM, [place], GENTPEAK, [place], [place], TOBRAPEAK, TOBRARND, AMIKACINPEAK, AMIKACINTROU, AMIKACIN,  in the last 72 hours   Microbiology: Recent Results (from the past 720 hour(s))  URINE CULTURE     Status: None   Collection Time    11/23/13 10:45 PM      Result Value Ref Range Status   Specimen Description URINE, CATHETERIZED   Final   Special Requests NONE   Final   Culture  Setup Time     Final   Value: 11/23/2013 02:00     Performed at [facility]     Final   Value: >=100,000 COLONIES/ML     Performed at [facility]   Culture     Final   Value: PROTEUS MIRABILIS     Performed at [facility]   Report Status 11/26/2013 FINAL   Final   Organism ID, Bacteria PROTEUS MIRABILIS   Final  CULTURE, BLOOD (ROUTINE X 2)     Status: None   Collection Time    11/24/13 12:42 AM      Result Value Ref Range Status   Specimen Description BLOOD LEFT WRIST   Final   Special Requests BOTTLES DRAWN AEROBIC ONLY 10CC   Final   Culture  Setup Time     Final   Value: 11/24/2013 08:24     Performed at [facility]   Culture     Final   Value: NO GROWTH 5 DAYS     Performed at [facility]   Report Status 11/30/2013 FINAL   Final  CULTURE,  BLOOD (ROUTINE X 2)     Status: None   Collection Time    11/24/13 12:45 AM      Result Value Ref Range Status   Specimen Description BLOOD RIGHT ARM   Final   Special Requests BOTTLES DRAWN AEROBIC AND ANAEROBIC [facility]   Final   Culture  Setup Time     Final   Value: 11/24/2013 08:25     Performed at [facility]   Culture     Final   Value: NO GROWTH 5 DAYS     Performed at [facility]   Report Status 11/30/2013 FINAL   Final    Medical History: Past Medical History  Diagnosis Date  . Mitral insufficiency     CHRONIC  . Hypertension   . Debility     GENERALIZED  . OA (osteoarthritis)   . OP (osteoporosis)   . CHF (congestive heart failure)   . History of diastolic dysfunction   . Muscle weakness (generalized)   . Difficulty walking   . Metabolic encephalopathy   . Hypothyroidism   . HOH (hard of hearing)     "extremely"  . SVT (supraventricular tachycardia)  nonsustained  . Heart murmur   . Repeated falls 2010    "3 serious falls; hit head each time; staples 1st 2 times"  . Vascular dementia dx'd 2011  . Peripheral vascular disease   . Dry cough 03/26/11    "chronic; dx'd years ago as allergy related type of thing"  . Bruises easily   . Chronic back pain greater than 3 months duration   . CVA (cerebrovascular accident) 03/2006; 04/2009    residual "speech problems & short term memory decreased"  . PNA (pneumonia)   . UTI (lower urinary tract infection)   . Hyperlipidemia     Medications:  Scheduled:  Infusions:   Assessment: 80 yo female from SNF presented to ER for wound check today. Note that patient was just discharged 9/8 with left heel wound, dehydration, UTI and acute encephalopathy. Patient had proteus UTI treated with cefepime then Keflex x 7 days total. Now going to start vancomycin and Zosyn per pharmacy dosing for continued wound infection. Xray of foot did not show osteomyelitis at previous admission  9/11 >> vancomycin >> 9/11 >>  Zosyn >>   Afebrile  WBC 12.9  SCr 0.51 with est CrCl of 25 ml/min CG and 40 N  9/11 blood:  9/11 urine:  Goal of Therapy:  Vancomycin trough level 10-15 mcg/ml (if osteomyelitis suspected, can change trough goal to 15-20)  Plan:  1) Vancomycin 1g IV q48 2) Zosyn 3.375g IV q8 (extended interval infusion)   Adrian Saran, PharmD, BCPS Pager [phone] 12/03/2013 3:36 PM

## 2013-12-03 NOTE — ED Notes (Signed)
Second attempt at cath.   Pt's Mother said that they had trouble with cath at [facility] also.   Right leg stiff, vulva swollen.

## 2013-12-04 DIAGNOSIS — F0150 Vascular dementia without behavioral disturbance: Secondary | ICD-10-CM

## 2013-12-04 LAB — URINALYSIS, ROUTINE W REFLEX MICROSCOPIC
BILIRUBIN URINE: NEGATIVE
Glucose, UA: NEGATIVE mg/dL
Ketones, ur: NEGATIVE mg/dL
Nitrite: NEGATIVE
PH: 5 (ref 5.0–8.0)
Protein, ur: NEGATIVE mg/dL
Specific Gravity, Urine: 1.011 (ref 1.005–1.030)
UROBILINOGEN UA: 0.2 mg/dL (ref 0.0–1.0)

## 2013-12-04 LAB — URINE MICROSCOPIC-ADD ON

## 2013-12-04 MED ORDER — MORPHINE SULFATE 2 MG/ML IJ SOLN
0.5000 mg | Freq: Three times a day (TID) | INTRAMUSCULAR | Status: DC
  Administered 2013-12-04 – 2013-12-09 (×14): 0.5 mg via INTRAVENOUS
  Filled 2013-12-04 (×14): qty 1

## 2013-12-04 MED ORDER — FLEET ENEMA 7-19 GM/118ML RE ENEM: 1.0000 | ENEMA | Freq: Every day | RECTAL | Status: DC | PRN

## 2013-12-04 MED ORDER — MORPHINE SULFATE 2 MG/ML IJ SOLN
1.0000 mg | INTRAMUSCULAR | Status: DC | PRN
  Administered 2013-12-05 (×2): 1 mg via INTRAVENOUS
  Filled 2013-12-04 (×3): qty 1

## 2013-12-04 MED ORDER — FUROSEMIDE 10 MG/ML IJ SOLN
20.0000 mg | Freq: Once | INTRAMUSCULAR | Status: AC
  Administered 2013-12-04: 20 mg via INTRAVENOUS
  Filled 2013-12-04: qty 2

## 2013-12-04 MED ORDER — BISACODYL 10 MG RE SUPP: 10.0000 mg | Freq: Every day | RECTAL | Status: DC | PRN

## 2013-12-04 NOTE — Evaluation (Signed)
Clinical/Bedside Swallow Evaluation Patient Details  Name: DEDE DOBESH MRN: [id number] Date of Birth: May 05, 1909  Today's Date: 12/04/2013 Time: 9735-3299 SLP Time Calculation (min): 32 min  Past Medical History:  Past Medical History  Diagnosis Date  . Mitral insufficiency     CHRONIC  . Hypertension   . Debility     GENERALIZED  . OA (osteoarthritis)   . OP (osteoporosis)   . CHF (congestive heart failure)   . History of diastolic dysfunction   . Muscle weakness (generalized)   . Difficulty walking   . Metabolic encephalopathy   . Hypothyroidism   . HOH (hard of hearing)     "extremely"  . SVT (supraventricular tachycardia)     nonsustained  . Heart murmur   . Repeated falls 2010    "3 serious falls; hit head each time; staples 1st 2 times"  . Vascular dementia dx'd 2011  . Peripheral vascular disease   . Dry cough 03/26/11    "chronic; dx'd years ago as allergy related type of thing"  . Bruises easily   . Chronic back pain greater than 3 months duration   . CVA (cerebrovascular accident) 03/2006; 04/2009    residual "speech problems & short term memory decreased"  . PNA (pneumonia)   . UTI (lower urinary tract infection)   . Hyperlipidemia    Past Surgical History:  Past Surgical History  Procedure Laterality Date  . Removal colon polpys  12/2000    Villous adenoma of the cecum.  . Tonsillectomy and adenoidectomy    . Appendectomy    . Cataract extraction w/ intraocular lens  implant, bilateral  1984  . Dilation and curettage of uterus  1960's    "several"   HPI:  80 year old female admitted 12/03/13 due to AMS, fever. PMH significant for dementia, CVA, PNA, dysphagia with silent aspiration.   Assessment / Plan / Recommendation Clinical Impression  Difficult to assess oral motor strength and function due to [facility] and dementia, however, speech intelligible. No overt s/s aspiration observed with any consistency tested, however, prior MBS indicated silent  aspiration of thin liquids. Will continue dys 2 diet with nectar thick liquids at this time. Per Dr. Sheran Fava, family is making decisions regarding GOC. Palliative Consult pending. Objective study is not recommended for pt, however, SLP is available for education of family re: continuing po intake with known risks of aspiration. ST to follow briefly for assessment of diet tolerance and family education.    Aspiration Risk  Moderate    Diet Recommendation Dysphagia 2 (Fine chop);Nectar-thick liquid   Liquid Administration via: Cup Medication Administration: Whole meds with puree Supervision: Staff to assist with self feeding;Full supervision/cueing for compensatory strategies Compensations: Slow rate;Small sips/bites;Follow solids with liquid Postural Changes and/or Swallow Maneuvers: Seated upright 90 degrees;Upright 30-60 min after meal    Other  Recommendations Oral Care Recommendations: Oral care BID Other Recommendations: Order thickener from pharmacy;Clarify dietary restrictions   Follow Up Recommendations  None    Frequency and Duration min 1 x/week  1 week   Pertinent Vitals/Pain VSS, no pain evident    SLP Swallow Goals  diet tolerance, family education   Swallow Study Prior Functional Status   History of dysphagia with silent aspiration of thin liquids, per MBS 2014    General Date of Onset: 12/03/13 HPI: 79 year old female admitted 12/03/13 due to AMS, fever. PMH significant for dementia, CVA, PNA, dysphagia with silent aspiration. Type of Study: Bedside swallow evaluation Previous Swallow  Assessment: MBS September 2014 revealed silent aspiration of thin liquids. Recommendation was for nectar thick liquids. Diet Prior to this Study: Dysphagia 2 (chopped);Nectar-thick liquids Temperature Spikes Noted: No Respiratory Status: Nasal cannula History of Recent Intubation: No Behavior/Cognition: Alert;Hard of hearing;Cooperative;Pleasant mood;Confused;Doesn't follow  directions;Requires cueing Oral Cavity - Dentition: Adequate natural dentition Self-Feeding Abilities: Total assist Patient Positioning: Upright in bed Baseline Vocal Quality: Clear Volitional Cough: Cognitively unable to elicit Volitional Swallow: Unable to elicit    Oral/Motor/Sensory Function Overall Oral Motor/Sensory Function: Appears within functional limits for tasks assessed   Ice Chips Ice chips: Not tested   Thin Liquid Thin Liquid: Within functional limits Presentation: Straw Other Comments: Silent aspiration documented on prior MBS    Nectar Thick Nectar Thick Liquid: Within functional limits Presentation: Straw   Honey Thick Honey Thick Liquid: Not tested   Puree Puree: Within functional limits Presentation: Spoon   Solid   GO    Solid: Within functional limits      Addie Cederberg B. Quentin Ore [facility], [place] [phone]  Shonna Chock 12/04/2013,11:32 AM

## 2013-12-04 NOTE — Progress Notes (Signed)
Call received from patient's daughter Doneisha Ivey requesting Dr. Lovena Le and for someone to "hold her hand through these deep waters"-clearly very emotional about her mothers condition and will need support- will also ask weekend chaplain to assist. I attempted to call listed number x1 no answer no VM. Will connect with her this weekend to further determine goals- Dr. Lovena Le will be available Monday.  Lane Hacker, DO Palliative Medicine

## 2013-12-04 NOTE — Progress Notes (Signed)
[facility] PROGRESS NOTE  Julie Frank TKP:[id number] DOB: 1910-02-22 DOA: 12/03/2013 PCP: Julie Duos, MD  Assessment/Plan  Chronic heel ulcer and gangrene with probable underlying osteomyelitis.   -  ESR 107 -  Family has declined amputation but has also not fully embraced comfort care -  Continue vancomycin and zosyn -  F/u blood cultures -  Wound care consult pending -  Dry dressings daily and as needed for now  PAD (peripheral artery disease) with gangrene -  Seen by Dr. Christean Frank   Altered mental status in the setting of vascular dementia, mildly improved on broad  -  Likely secondary to smoldering infection and inflammation from gangrene and osteo.  She may clear temporarily on broad spectrum antibiotics but ultimately, she will either need amputation or palliation.    Protein-calorie malnutrition, severe  -  Dietitian consultation requested -  Resume dysphagia 2 diet with nectar thick liquids -  Speech therapy assessment -  Discussed liberalizing diet with daughter for comfort and she will discuss with Dr. Lovena Frank on Monday  Pulmonary edema due to acute on chronic diastolic heart failure vs. HCAP vs. Aspiration pneumonia -  Continue abx  -  D/c IVF -  Lasix 50m IV once  Leukocytosis due to osteo and gangrene -  Repeat WBC in AM  Normocytic anemia likely due to chronic disease -  Trend hgb  Thrombocytosis, acute phase reactant -  Trend plt  Diet:  Dysphagia 2 Access:  PIV IVF:  off Proph:  lovenox  Code Status: DNR Family Communication: spoke with Daughter Julie [phone]  Please call on the daily basis. Disposition Plan:  Continue IV antibiotics for now, continue pain management.  Continue goals of care conversation.     Consultants:  Palliative care, requests Dr. TLovena Frank Procedures:  CXR  Antibiotics:  vanc 9/11 >>   Zosyn 9/11 >>  HPI/Subjective:   HOH, shouted questions to left ear.  Able to say that she was in  pain, but denies nausea, difficulty breathing.    Objective: Filed Vitals:   12/03/13 1900 12/03/13 2100 12/03/13 2207 12/04/13 0516  BP:   115/60 126/72  Pulse:   76 79  Temp: 97.6 F (36.4 C)  97.6 F (36.4 C) 97.3 F (36.3 C)  TempSrc: Axillary  Axillary Oral  Resp:   23 22  Height:      Weight:      SpO2:  97% 97% 95%    Intake/Output Summary (Last 24 hours) at 12/04/13 0952 Last data filed at 12/04/13 0700  Gross per 24 hour  Intake 991.25 ml  Output      1 ml  Net 990.25 ml   Filed Weights   12/03/13 1500  Weight: 52.6 kg (115 lb 15.4 oz)    Exam:   General:  WF, asleep but arousable.  No acute distress  HEENT:  NCAT, MMM  Cardiovascular:  RRR, nl S1, S2 no mrg, 2+ pulses, warm extremities  Respiratory:  Rales and diminished bilateral breath sounds, no wheezes or rhonchi, no increased WOB  Abdomen:   NABS, soft, NT/ND  MSK:   Increased tone, decrease muscle mass, left heel necrotic with surrounding erythema, some purulence and foul odor  Neuro:  Lying on right side and moves bilateral arms and left leg voluntary and withdraws as if in pain   Data Reviewed: Basic Metabolic Panel:  Recent Labs Lab 11/28/13 0438 11/29/13 0500 12/03/13 1407  NA 141 144 143  K 3.5* 3.8  4.4  CL 109 111 108  CO2 20 22 24   GLUCOSE 95 92 98  BUN 15 18 20   CREATININE 0.39* 0.41* 0.51  CALCIUM 7.7* 8.1* 8.5   Liver Function Tests: No results found for this basename: AST, ALT, ALKPHOS, BILITOT, PROT, ALBUMIN,  in the last 168 hours No results found for this basename: LIPASE, AMYLASE,  in the last 168 hours No results found for this basename: AMMONIA,  in the last 168 hours CBC:  Recent Labs Lab 11/28/13 0438 12/03/13 1407  WBC 10.9* 12.9*  NEUTROABS  --  11.0*  HGB 10.9* 11.4*  HCT 35.1* 35.8*  MCV 92.1 90.9  PLT 316 416*   Cardiac Enzymes:  Recent Labs Lab 12/03/13 1407  TROPONINI <0.30   BNP (last 3 results)  Recent Labs  12/04/12 2039  12/05/12 0519  PROBNP 14051.0* 14473.0*   CBG: No results found for this basename: GLUCAP,  in the last 168 hours  No results found for this or any previous visit (from the past 240 hour(s)).   Studies: Dg Chest Port 1 View  12/03/2013   CLINICAL DATA:  Tachypnea. History of hypertension, CHF, altered level of consciousness.  EXAM: PORTABLE CHEST - 1 VIEW  COMPARISON:  11/30/2013  FINDINGS: Heart is enlarged. There are bilateral pleural effusions. Bibasilar opacities obscure the hemidiaphragms. There is increased perihilar infiltrate.  IMPRESSION: 1. Cardiomegaly and increasing airspace filling, suggestive of increased pulmonary edema and or infectious process. 2. Persistent bilateral pleural effusions.   Electronically Signed   By: Shon Hale M.D.   On: 12/03/2013 14:30    Scheduled Meds: . aspirin  300 mg Rectal Daily  . enoxaparin (LOVENOX) injection  30 mg Subcutaneous Q24H  . levothyroxine  12.5 mcg Intravenous Daily  . piperacillin-tazobactam (ZOSYN)  IV  3.375 g Intravenous Q8H  . [START ON 12/05/2013] vancomycin  1,000 mg Intravenous Q48H   Continuous Infusions: . sodium chloride 75 mL/hr at 12/04/13 0550    Principal Problem:   Chronic heel ulcer with probable underlying osteomyelitis/chronic infection Active Problems:   PAD (peripheral artery disease)   Altered mental status   Protein-calorie malnutrition, severe   Adult failure to thrive   Recurrent infections    Time spent: 30 min    Julie Frank, [place] [facility] Pager [phone]. If 7PM-7AM, please contact night-coverage at [email or web address], password Julie Frank 12/04/2013, 9:52 AM  LOS: 1 day

## 2013-12-04 NOTE — Consult Note (Addendum)
WOC wound consult note  Reason for Consult: Consult requested for left heel. Pt familiar to [place] team from previous admission; refer to progress notes on 9/2. Pt has chronic wound to left heel and had a vascular consult during the previous admission; they recommended amputation according to progress notes. Wound basically unchanged from previous assessment. Wound type: Left heel with unstageable wound; 9X6cm. 100% eschar, fluctuant and painful when touched, small amt tan drainage, strong foul odor.  Outer left foot with dry stable eschar; 1.5X.5cm without odor, fluctuance, or drainage.  Right heel with stage 1 area of non blanching erythemia; 2X2cm  Right inner ankle with stage 1 area of non blanching erythemia; .5X.5cm  Pressure Ulcer POA: Yes  Dressing procedure/placement/frequency: Pt is wearing pressure-reducing heel lift boots bilat. Topical treatment will not be effective in healing left foot wound at this stage. Dry dressing to protect from further injury. Please defer to VVS team if aggressive plan of care is desired.  Please re-consult if further assistance is needed. Thank-you,  Julien Girt MSN, [place], [place], [place], [place]

## 2013-12-05 DIAGNOSIS — I509 Heart failure, unspecified: Secondary | ICD-10-CM

## 2013-12-05 DIAGNOSIS — R5383 Other fatigue: Secondary | ICD-10-CM

## 2013-12-05 DIAGNOSIS — R5381 Other malaise: Secondary | ICD-10-CM

## 2013-12-05 DIAGNOSIS — I5032 Chronic diastolic (congestive) heart failure: Secondary | ICD-10-CM

## 2013-12-05 LAB — BASIC METABOLIC PANEL
Anion gap: 12 (ref 5–15)
BUN: 16 mg/dL (ref 6–23)
CO2: 28 mEq/L (ref 19–32)
Calcium: 8.5 mg/dL (ref 8.4–10.5)
Chloride: 107 mEq/L (ref 96–112)
Creatinine, Ser: 0.57 mg/dL (ref 0.50–1.10)
GFR calc non Af Amer: 73 mL/min — ABNORMAL LOW (ref >90)
GFR, EST AFRICAN AMERICAN: 84 mL/min — AB (ref >90)
GLUCOSE: 93 mg/dL (ref 70–99)
POTASSIUM: 4 meq/L (ref 3.7–5.3)
SODIUM: 147 meq/L (ref 137–147)

## 2013-12-05 LAB — CBC
HCT: 36.3 % (ref 36.0–46.0)
Hemoglobin: 11 g/dL — ABNORMAL LOW (ref 12.0–15.0)
MCH: 28.4 pg (ref 26.0–34.0)
MCHC: 30.3 g/dL (ref 30.0–36.0)
MCV: 93.6 fL (ref 78.0–100.0)
PLATELETS: 464 10*3/uL — AB (ref 150–400)
RBC: 3.88 MIL/uL (ref 3.87–5.11)
RDW: 16.2 % — ABNORMAL HIGH (ref 11.5–15.5)
WBC: 11.6 10*3/uL — ABNORMAL HIGH (ref 4.0–10.5)

## 2013-12-05 LAB — URINE CULTURE
COLONY COUNT: NO GROWTH
Culture: NO GROWTH

## 2013-12-05 MED ORDER — ENSURE COMPLETE PO LIQD
237.0000 mL | Freq: Two times a day (BID) | ORAL | Status: DC
  Administered 2013-12-05 – 2013-12-10 (×8): 237 mL via ORAL

## 2013-12-05 MED ORDER — PRO-STAT SUGAR FREE PO LIQD
30.0000 mL | Freq: Two times a day (BID) | ORAL | Status: DC
  Administered 2013-12-05 – 2013-12-10 (×8): 30 mL via ORAL
  Filled 2013-12-05 (×12): qty 30

## 2013-12-05 NOTE — Progress Notes (Signed)
Writer spoke with IV team and requested new site, as pt pulled out right hand new site of last night.

## 2013-12-05 NOTE — Clinical Social Work Psychosocial (Addendum)
    Clinical Social Work Department BRIEF PSYCHOSOCIAL ASSESSMENT 12/05/2013  Patient:  Julie Frank, Julie Frank     Account Number:  [id number]     Admit date:  12/03/2013  Clinical Social Worker:  Iona Coach  Date/Time:  12/05/2013 03:00 PM  Referred by:  Physician  Date Referred:  12/05/2013 Referred for  Other - See comment   Other Referral:   Return to SNF   Interview type:  Family Other interview type:   daughter- Suprena Travaglini    PSYCHOSOCIAL DATA Living Status:  FACILITY Admitted from facility:  [facility] Level of care:  [place] Primary support name:  Marites Nath  [phone] Primary support relationship to patient:  CHILD, ADULT Degree of support available:   Strong support    CURRENT CONCERNS Current Concerns  Post-Acute Placement   Other Concerns:   Return to [place] / PLAN 80 year old female- admitted to [facility] on 12/02/13 from [facility].  Prior to this- she was a resident of [facility] and [place].  Patinent is alert to person only; she is deaf in her right ear and has poor hearing in her left ear. CSW spoke to her daughter [name]. She wants her mother to return to [facility] when medically stable.  Daughter verbalized that she is emotionally and physically exhausted and is trying to get some rest today- thus was not going to come to the hospital today. She is hoping to be able to talk to her mother's doctor and with the wound care nurse and Dr. Hilma Favors- Palliative care.  Fl2 initiated and placed on chart for MD's siganture.   Assessment/plan status:  Psychosocial Support/Ongoing Assessment of Needs Other assessment/ plan:   Information/referral to community resources:   None at this time    Patient may need Palliative Care follow up- awaiting recommendations by Palliative Care MD    PATIENT'S/FAMILY'S RESPONSE TO PLAN OF CARE: Patient is alert and oriented to person- she  is able to respond with brief words but does not hear well. Her daughter [name] has been her primary support person for many  years. Daughter states that she is facing many current stressors herself financially and is attempting to be strong for her mother.  CSW provided encourgement and support.  Plan return to SNF when medically stable. CSW spoke to patient's nurse and to Dr. Sheran Fava re: above information. Dr. Sheran Fava will follow up with daughter later today.    Lorie Phenix. Pauline Good, [place]

## 2013-12-05 NOTE — Progress Notes (Signed)
[facility] PROGRESS NOTE  Julie Frank DVV:[id number] DOB: 03-Oct-1909 DOA: 12/03/2013 PCP: Hennie Duos, MD  Assessment/Plan  Chronic heel ulcer and gangrene with probable underlying osteomyelitis.   -  ESR 107 -  Family has declined amputation and is transitioning to comfort measures -  Continue vancomycin and zosyn day 3 -  Blood cultures NGTD -  Appreciate Wound care assistance -  Dry dressings daily and as needed for now -  Daughter would like to discuss GOC and plan of care with Dr. Lovena Le on Monday  PAD (peripheral artery disease) with gangrene -  Seen by Dr. Christean Grief   Altered mental status in the setting of vascular dementia, improved on broad spectrum antibiotics -  Likely secondary to smoldering infection and inflammation  Protein-calorie malnutrition, severe  -  Appreciate nutrition recommendations -  Change to dysphagia 1 with nectar thick liquids -  Appreciate Speech therapy assistance  Pulmonary edema due to acute on chronic diastolic heart failure vs. HCAP vs. Aspiration pneumonia -  Continue abx  -  Encourage PO intake -  No IVF  Leukocytosis due to osteo and gangrene, trended down slightly   Normocytic anemia likely due to chronic disease, hgb approximately stable  Thrombocytosis, acute phase reactant, rising -  Trend plt  Diet:  Dysphagia 2 Access:  PIV IVF:  off Proph:  lovenox  Code Status: DNR Family Communication: spoke with Daughter Rhaya Coale [phone].  Please call on the daily basis. Disposition Plan:  Continue IV antibiotics for now, continue pain management.  Continue goals of care conversation.     Consultants:  Palliative care, requests Dr. Lovena Le  Procedures:  CXR  Antibiotics:  vanc 9/11 >>   Zosyn 9/11 >>  HPI/Subjective:   HOH, shouted questions to left ear.  Denies pain, SOB, vomiting.  Per RN tech, she is not able to chew and swallow the dysphagia 2 diet.    Objective: Filed Vitals:   12/04/13 0516 12/04/13 1410 12/04/13 2130 12/05/13 0545  BP: 126/72 95/66 133/60 118/49  Pulse: 79 77 114 78  Temp: 97.3 F (36.3 C) 97.8 F (36.6 C) 97.7 F (36.5 C) 97.9 F (36.6 C)  TempSrc: Oral Oral Oral Oral  Resp: _0 Height:      Weight:      SpO2: 95% 98% 97% 92%    Intake/Output Summary (Last 24 hours) at 12/05/13 1317 Last data filed at 12/05/13 1012  Gross per 24 hour  Intake  537.5 ml  Output    725 ml  Net -187.5 ml   Filed Weights   12/03/13 1500  Weight: 52.6 kg (115 lb 15.4 oz)    Exam:   General:  WF, awake and being fed dysphagia diet  HEENT:  NCAT, MMM  Cardiovascular:  RRR, nl S1, S2 no mrg, 2+ pulses, warm extremities  Respiratory:  Rales and diminished bilateral breath sounds, no wheezes or rhonchi, no increased WOB  Abdomen:   NABS, soft, NT/ND  MSK:   Increased tone, decrease muscle mass, left heel necrotic with surrounding erythema, some purulence and foul odor  Neuro:  Lying on right side and moves bilateral arms and left leg voluntary and withdraws as if in pain   Data Reviewed: Basic Metabolic Panel:  Recent Labs Lab 11/29/13 0500 12/03/13 1407 12/05/13 0518  NA 144 143 147  K 3.8 4.4 4.0  CL 111 108 107  CO2 _1 GLUCOSE 92 98 93  BUN 18 20  16  CREATININE 0.41* 0.51 0.57  CALCIUM 8.1* 8.5 8.5   Liver Function Tests: No results found for this basename: AST, ALT, ALKPHOS, BILITOT, PROT, ALBUMIN,  in the last 168 hours No results found for this basename: LIPASE, AMYLASE,  in the last 168 hours No results found for this basename: AMMONIA,  in the last 168 hours CBC:  Recent Labs Lab 12/03/13 1407 12/05/13 0518  WBC 12.9* 11.6*  NEUTROABS 11.0*  --   HGB 11.4* 11.0*  HCT 35.8* 36.3  MCV 90.9 93.6  PLT 416* 464*   Cardiac Enzymes:  Recent Labs Lab 12/03/13 1407  TROPONINI <0.30   BNP (last 3 results) No results found for this basename: PROBNP,  in the last 8760 hours CBG: No results found for  this basename: GLUCAP,  in the last 168 hours  Recent Results (from the past 240 hour(s))  CULTURE, BLOOD (ROUTINE X 2)     Status: None   Collection Time    12/03/13  2:06 PM      Result Value Ref Range Status   Specimen Description BLOOD RIGHT FOREARM   Final   Special Requests BOTTLES DRAWN AEROBIC AND ANAEROBIC 5ML   Final   Culture  Setup Time     Final   Value: 12/03/2013 17:24     Performed at [facility]   Culture     Final   Value:        BLOOD CULTURE RECEIVED NO GROWTH TO DATE CULTURE WILL BE HELD FOR 5 DAYS BEFORE ISSUING A FINAL NEGATIVE REPORT     Performed at [facility]   Report Status PENDING   Incomplete  CULTURE, BLOOD (ROUTINE X 2)     Status: None   Collection Time    12/03/13  2:51 PM      Result Value Ref Range Status   Specimen Description BLOOD BLOOD LEFT FOREARM   Final   Special Requests BOTTLES DRAWN AEROBIC AND ANAEROBIC 3ML   Final   Culture  Setup Time     Final   Value: 12/03/2013 17:25     Performed at [facility]   Culture     Final   Value:        BLOOD CULTURE RECEIVED NO GROWTH TO DATE CULTURE WILL BE HELD FOR 5 DAYS BEFORE ISSUING A FINAL NEGATIVE REPORT     Performed at [facility]   Report Status PENDING   Incomplete     Studies: Dg Chest Port 1 View  12/03/2013   CLINICAL DATA:  Tachypnea. History of hypertension, CHF, altered level of consciousness.  EXAM: PORTABLE CHEST - 1 VIEW  COMPARISON:  11/30/2013  FINDINGS: Heart is enlarged. There are bilateral pleural effusions. Bibasilar opacities obscure the hemidiaphragms. There is increased perihilar infiltrate.  IMPRESSION: 1. Cardiomegaly and increasing airspace filling, suggestive of increased pulmonary edema and or infectious process. 2. Persistent bilateral pleural effusions.   Electronically Signed   By: Shon Hale M.D.   On: 12/03/2013 14:30    Scheduled Meds: . aspirin  300 mg Rectal Daily  . enoxaparin (LOVENOX) injection  30 mg Subcutaneous  Q24H  . feeding supplement (ENSURE COMPLETE)  237 mL Oral BID BM  . feeding supplement (PRO-STAT SUGAR FREE 64)  30 mL Oral BID  . levothyroxine  12.5 mcg Intravenous Daily  .  morphine injection  0.5 mg Intravenous TID  . piperacillin-tazobactam (ZOSYN)  IV  3.375 g Intravenous Q8H  . vancomycin  1,000 mg Intravenous Q48H   Continuous Infusions:    Principal Problem:   Chronic heel ulcer with probable underlying osteomyelitis/chronic infection Active Problems:   PAD (peripheral artery disease)   Altered mental status   Protein-calorie malnutrition, severe   Adult failure to thrive   Recurrent infections    Time spent: 30 min    Oveta Idris, [place] [facility] Pager [phone]. If 7PM-7AM, please contact night-coverage at [email or web address], password [facility] 12/05/2013, 1:17 PM  LOS: 2 days

## 2013-12-05 NOTE — Progress Notes (Signed)
Palliative Medicine Team (Dr. Lovena Le) scheduled to meet with Julie Frank at 12 noon 9/14 to re-address goals of care.  Lane Hacker, DO Palliative Medicine

## 2013-12-05 NOTE — Progress Notes (Signed)
INITIAL NUTRITION ASSESSMENT  Pt meets criteria for SEVERE MALNUTRITION in the context of chronic illness as evidenced by a weight loss of 7.7% in 3 months and severe fat and muscle mass depletion.  DOCUMENTATION CODES Per approved criteria  -Severe malnutrition in the context of chronic illness   INTERVENTION: Provide Ensure Complete po BID thickened to nectar thick consistency, each supplement provides 350 kcal and 13 grams of protein. Provide 30 ml Prostat BID, each supplement provides 100 kcal and 15 grams of protein. RD to continue to follow nutrition care plan.  NUTRITION DIAGNOSIS: Malnutrition related to chronic illness as evidenced by a weight loss of 7.7% in 3 months and severe fat and muscle mass depletion.   Goal: Pt to meet >/= 90% of their estimated nutrition needs   Monitor:  PO intake, weight trends, labs, I/O's  Reason for Assessment: Malnutrition Screening Tool and MD Consult  80 y.o. female  Admitting Dx: Chronic heel ulcer  ASSESSMENT: Pt with PMH of HTN, CHF, osteoporosis, dementia, and mitral insufficiency. Recent admission 2/2 sepsis from urinary source. Pt with chronic osteomyelitis 2/2 PVD. Has had recommendations for AKA, but family declined during previous admission. Admitted presently for AMS, low grade fever and worsening heel wound.  SLP eval on 9/12 recommended Dysphagia 2 diet with Nectar-Thickened liquids. Currently eating 25-50% of meals. Discussed meal intake with nurse tech. Pt was able to eat all of Magic Cup and oatmeal this morning. She notes that last night, pt received spaghetti and chewed the meat and spit it out. She also notes that the family that was here last night was asking if we could resume supplements that were ordered at previous admission. Will continue them at this time.   [place] RN eval on 9/12 - pt with unstageable L heel wound, R heel stage I, R ankle stage I.  Palliative care consult pending; per chart, plan to attempt [facility]  soon.  Question accuracy of current weight? Likely artificially elevated, as her previous weight last month was 20 lb lower (96 lb.)   Nutrition Focused Physical Exam:  Subcutaneous Fat:  Orbital Region: N/A Upper Arm Region: Moderate depletion Thoracic and Lumbar Region: Severe depletion  Muscle:  Temple Region: Moderate depletion Clavicle Bone Region: Moderate depletion Clavicle and Acromion Bone Region: Moderate depletion Scapular Bone Region: N/A Dorsal Hand: Severe depletion Patellar Region: Severe depletion Anterior Thigh Region: Severe depletion Posterior Calf Region: Severe depletion  Edema: none  Labs reveiwed.  Height: Ht Readings from Last 1 Encounters:  12/03/13 4' 11.84" (1.52 m)    Weight: Wt Readings from Last 1 Encounters:  12/03/13 115 lb 15.4 oz (52.6 kg)    Ideal Body Weight: 100 lbs  % Ideal Body Weight: 95%  Wt Readings from Last 10 Encounters:  12/03/13 115 lb 15.4 oz (52.6 kg)  11/30/13 115 lb 15.4 oz (52.6 kg)  11/09/13 96 lb (43.545 kg)  10/26/13 103 lb (46.72 kg)  10/13/13 103 lb (46.72 kg)  09/17/13 103 lb (46.72 kg)  08/10/13 101 lb 9.6 oz (46.085 kg)  07/09/13 102 lb (46.267 kg)  05/05/13 102 lb (46.267 kg)  04/30/13 101 lb (45.813 kg)    Usual Body Weight: 103 lbs  % Usual Body Weight: 92%  BMI:  Body mass index is 22.77 kg/(m^2). WNL  Estimated Nutritional Needs: Kcal: 1300-1500  Protein: 60-70 grams  Fluid: >1.5 L/day  Skin:  unstageable L heel wound Stage I R heel wound Stage I R ankle wound Stage II L buttocks  Diet Order: Dysphagia 2 with Nectar Thickened Liquids  EDUCATION NEEDS: -No education needs identified at this time   Intake/Output Summary (Last 24 hours) at 12/05/13 0945 Last data filed at 12/05/13 0600  Gross per 24 hour  Intake   37.5 ml  Output    725 ml  Net -687.5 ml    Last BM: 9/12  Labs:   Recent Labs Lab 11/29/13 0500 12/03/13 1407 12/05/13 0518  NA 144 143 147  K 3.8  4.4 4.0  CL 111 108 107  CO2 22 24 28   BUN 18 20 16   CREATININE 0.41* 0.51 0.57  CALCIUM 8.1* 8.5 8.5  GLUCOSE 92 98 93    CBG (last 3)  No results found for this basename: GLUCAP,  in the last 72 hours  Scheduled Meds: . aspirin  300 mg Rectal Daily  . enoxaparin (LOVENOX) injection  30 mg Subcutaneous Q24H  . levothyroxine  12.5 mcg Intravenous Daily  .  morphine injection  0.5 mg Intravenous TID  . piperacillin-tazobactam (ZOSYN)  IV  3.375 g Intravenous Q8H  . vancomycin  1,000 mg Intravenous Q48H    Continuous Infusions:    Past Medical History  Diagnosis Date  . Mitral insufficiency     CHRONIC  . Hypertension   . Debility     GENERALIZED  . OA (osteoarthritis)   . OP (osteoporosis)   . CHF (congestive heart failure)   . History of diastolic dysfunction   . Muscle weakness (generalized)   . Difficulty walking   . Metabolic encephalopathy   . Hypothyroidism   . HOH (hard of hearing)     "extremely"  . SVT (supraventricular tachycardia)     nonsustained  . Heart murmur   . Repeated falls 2010    "3 serious falls; hit head each time; staples 1st 2 times"  . Vascular dementia dx'd 2011  . Peripheral vascular disease   . Dry cough 03/26/11    "chronic; dx'd years ago as allergy related type of thing"  . Bruises easily   . Chronic back pain greater than 3 months duration   . CVA (cerebrovascular accident) 03/2006; 04/2009    residual "speech problems & short term memory decreased"  . PNA (pneumonia)   . UTI (lower urinary tract infection)   . Hyperlipidemia     Past Surgical History  Procedure Laterality Date  . Removal colon polpys  12/2000    Villous adenoma of the cecum.  . Tonsillectomy and adenoidectomy    . Appendectomy    . Cataract extraction w/ intraocular lens  implant, bilateral  1984  . Dilation and curettage of uterus  1960's    "several"    Inda Coke MS, RD, LDN Inpatient Registered Dietitian After-hours pager: [phone]

## 2013-12-06 ENCOUNTER — Telehealth: Payer: Self-pay | Admitting: Vascular Surgery

## 2013-12-06 ENCOUNTER — Encounter: Payer: Self-pay | Admitting: Cardiovascular Disease

## 2013-12-06 ENCOUNTER — Telehealth: Payer: Self-pay | Admitting: Cardiology

## 2013-12-06 DIAGNOSIS — Z515 Encounter for palliative care: Secondary | ICD-10-CM

## 2013-12-06 LAB — CBC
HCT: 34.9 % — ABNORMAL LOW (ref 36.0–46.0)
HEMOGLOBIN: 10.8 g/dL — AB (ref 12.0–15.0)
MCH: 28.4 pg (ref 26.0–34.0)
MCHC: 30.9 g/dL (ref 30.0–36.0)
MCV: 91.8 fL (ref 78.0–100.0)
Platelets: 408 10*3/uL — ABNORMAL HIGH (ref 150–400)
RBC: 3.8 MIL/uL — ABNORMAL LOW (ref 3.87–5.11)
RDW: 16.2 % — ABNORMAL HIGH (ref 11.5–15.5)
WBC: 11.6 10*3/uL — AB (ref 4.0–10.5)

## 2013-12-06 LAB — BASIC METABOLIC PANEL
ANION GAP: 11 (ref 5–15)
BUN: 16 mg/dL (ref 6–23)
CO2: 25 meq/L (ref 19–32)
Calcium: 8.3 mg/dL — ABNORMAL LOW (ref 8.4–10.5)
Chloride: 108 mEq/L (ref 96–112)
Creatinine, Ser: 0.53 mg/dL (ref 0.50–1.10)
GFR calc Af Amer: 86 mL/min — ABNORMAL LOW (ref >90)
GFR calc non Af Amer: 74 mL/min — ABNORMAL LOW (ref >90)
Glucose, Bld: 104 mg/dL — ABNORMAL HIGH (ref 70–99)
POTASSIUM: 4 meq/L (ref 3.7–5.3)
SODIUM: 144 meq/L (ref 137–147)

## 2013-12-06 MED ORDER — LEVOTHYROXINE SODIUM 25 MCG PO TABS
25.0000 ug | ORAL_TABLET | Freq: Every day | ORAL | Status: DC
  Administered 2013-12-06 – 2013-12-10 (×5): 25 ug via ORAL
  Filled 2013-12-06 (×6): qty 1

## 2013-12-06 MED ORDER — DOCUSATE SODIUM 100 MG PO CAPS
100.0000 mg | ORAL_CAPSULE | Freq: Every day | ORAL | Status: DC
  Administered 2013-12-06 – 2013-12-10 (×5): 100 mg via ORAL
  Filled 2013-12-06 (×5): qty 1

## 2013-12-06 MED ORDER — ASPIRIN EC 325 MG PO TBEC
325.0000 mg | DELAYED_RELEASE_TABLET | Freq: Every day | ORAL | Status: DC
  Administered 2013-12-06 – 2013-12-10 (×4): 325 mg via ORAL
  Filled 2013-12-06 (×5): qty 1

## 2013-12-06 MED ORDER — SACCHAROMYCES BOULARDII 250 MG PO CAPS
250.0000 mg | ORAL_CAPSULE | Freq: Two times a day (BID) | ORAL | Status: DC
  Administered 2013-12-06 – 2013-12-10 (×8): 250 mg via ORAL
  Filled 2013-12-06 (×10): qty 1

## 2013-12-06 MED ORDER — PANTOPRAZOLE SODIUM 40 MG PO TBEC
40.0000 mg | DELAYED_RELEASE_TABLET | Freq: Every day | ORAL | Status: DC
  Administered 2013-12-06 – 2013-12-10 (×4): 40 mg via ORAL
  Filled 2013-12-06 (×4): qty 1

## 2013-12-06 NOTE — Progress Notes (Signed)
Clinical Social Work  Per chart review, patient is from [facility] and PMT meeting was scheduled for 12 today. CSW called SNF to determine if patient is LT or ST patient and if they can accept patient back when stable. CSW left a message with admissions and is awaiting a return call. CSW will continue to follow to assist with DC planning.  [place], [place] [phone]

## 2013-12-06 NOTE — Progress Notes (Signed)
ANTIBIOTIC CONSULT NOTE - FOLLOW UP  Pharmacy Consult for vancomycin, Zosyn Indication: chronic heel ulcer with gangrene and probable underlying osteomyelitis  No Known Allergies  Patient Measurements: Height: 4' 11.84" (152 cm) Weight: 115 lb 15.4 oz (52.6 kg) IBW/kg (Calculated) : 45.14   Vital Signs: Temp: 97.4 F (36.3 C) (09/14 0602) Temp src: Axillary (09/14 0602) BP: 125/71 mmHg (09/14 0602) Pulse Rate: 81 (09/14 0602) Intake/Output from previous day: 09/13 0701 - 09/14 0700 In: 520 [P.O.:520] Out: 425 [Urine:425] Intake/Output from this shift: Total I/O In: 600 [P.O.:600] Out: 200 [Urine:200]  Labs:  Recent Labs  12/03/13 1407 12/05/13 0518 12/06/13 0503  WBC 12.9* 11.6* 11.6*  HGB 11.4* 11.0* 10.8*  PLT 416* 464* 408*  CREATININE 0.51 0.57 0.53   Estimated Creatinine Clearance: 25.3 ml/min (by C-G formula based on Cr of 0.53). No results found for this basename: VANCOTROUGH, Corlis Leak, VANCORANDOM, [place], GENTPEAK, GENTRANDOM, [place], TOBRAPEAK, TOBRARND, AMIKACINPEAK, AMIKACINTROU, AMIKACIN,  in the last 72 hours   Microbiology: Recent Results (from the past 720 hour(s))  URINE CULTURE     Status: None   Collection Time    11/23/13 10:45 PM      Result Value Ref Range Status   Specimen Description URINE, CATHETERIZED   Final   Special Requests NONE   Final   Culture  Setup Time     Final   Value: 11/23/2013 02:00     Performed at [facility]     Final   Value: >=100,000 COLONIES/ML     Performed at [facility]   Culture     Final   Value: PROTEUS MIRABILIS     Performed at [facility]   Report Status 11/26/2013 FINAL   Final   Organism ID, Bacteria PROTEUS MIRABILIS   Final  CULTURE, BLOOD (ROUTINE X 2)     Status: None   Collection Time    11/24/13 12:42 AM      Result Value Ref Range Status   Specimen Description BLOOD LEFT WRIST   Final   Special Requests BOTTLES DRAWN AEROBIC ONLY 10CC    Final   Culture  Setup Time     Final   Value: 11/24/2013 08:24     Performed at [facility]   Culture     Final   Value: NO GROWTH 5 DAYS     Performed at [facility]   Report Status 11/30/2013 FINAL   Final  CULTURE, BLOOD (ROUTINE X 2)     Status: None   Collection Time    11/24/13 12:45 AM      Result Value Ref Range Status   Specimen Description BLOOD RIGHT ARM   Final   Special Requests BOTTLES DRAWN AEROBIC AND ANAEROBIC [facility]   Final   Culture  Setup Time     Final   Value: 11/24/2013 08:25     Performed at [facility]   Culture     Final   Value: NO GROWTH 5 DAYS     Performed at [facility]   Report Status 11/30/2013 FINAL   Final  CULTURE, BLOOD (ROUTINE X 2)     Status: None   Collection Time    12/03/13  2:06 PM      Result Value Ref Range Status   Specimen Description BLOOD RIGHT FOREARM   Final   Special Requests BOTTLES DRAWN AEROBIC AND ANAEROBIC 5ML   Final   Culture  Setup Time     Final   Value: 12/03/2013 17:24     Performed at [facility]   Culture     Final   Value:        BLOOD CULTURE RECEIVED NO GROWTH TO DATE CULTURE WILL BE HELD FOR 5 DAYS BEFORE ISSUING A FINAL NEGATIVE REPORT     Performed at [facility]   Report Status PENDING   Incomplete  CULTURE, BLOOD (ROUTINE X 2)     Status: None   Collection Time    12/03/13  2:51 PM      Result Value Ref Range Status   Specimen Description BLOOD BLOOD LEFT FOREARM   Final   Special Requests BOTTLES DRAWN AEROBIC AND ANAEROBIC 3ML   Final   Culture  Setup Time     Final   Value: 12/03/2013 17:25     Performed at [facility]   Culture     Final   Value:        BLOOD CULTURE RECEIVED NO GROWTH TO DATE CULTURE WILL BE HELD FOR 5 DAYS BEFORE ISSUING A FINAL NEGATIVE REPORT     Performed at [facility]   Report Status PENDING   Incomplete  URINE CULTURE     Status: None   Collection Time    12/04/13  1:40 PM      Result Value Ref  Range Status   Specimen Description URINE, CATHETERIZED   Final   Special Requests NONE   Final   Culture  Setup Time     Final   Value: 12/04/2013 19:35     Performed at [facility]     Final   Value: NO GROWTH     Performed at [facility]   Culture     Final   Value: NO GROWTH     Performed at [facility]   Report Status 12/05/2013 FINAL   Final    Anti-infectives   Start     Dose/Rate Route Frequency Ordered Stop   12/05/13 1600  vancomycin (VANCOCIN) IVPB 1000 mg/200 mL premix     1,000 mg 200 mL/hr over 60 Minutes Intravenous Every 48 hours 12/03/13 1541     12/04/13 0000  piperacillin-tazobactam (ZOSYN) IVPB 3.375 g     3.375 g 12.5 mL/hr over 240 Minutes Intravenous Every 8 hours 12/03/13 1541     12/03/13 1700  vancomycin (VANCOCIN) IVPB 1000 mg/200 mL premix     1,000 mg 200 mL/hr over 60 Minutes Intravenous  Once 12/03/13 1633 12/03/13 1848   12/03/13 1545  vancomycin (VANCOCIN) IVPB 1000 mg/200 mL premix  Status:  Discontinued     1,000 mg 200 mL/hr over 60 Minutes Intravenous  Once 12/03/13 1540 12/03/13 1633   12/03/13 1545  piperacillin-tazobactam (ZOSYN) IVPB 3.375 g     3.375 g 100 mL/hr over 30 Minutes Intravenous  Once 12/03/13 1540 12/03/13 1627      Assessment: 80 y/o F with multiple medical problems, hospitalized 11/23/13-11/30/13 with urosepsis and possible chronic osteomyelitis secondary to chronic 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  Patient and family declined vascular surgery recommendation for AKA.   Patient was readmitted 12/03/13 with AMS, low grade fever, and worsening heel wound. Empiric Zosyn and vancomycin were started with pharmacy dosing assistance requested  Goal of Therapy:  Vancomycin trough 10-15 if no osteomyelitis, 15-20 if osteomyelitis present  9/14:  D#4 Zosyn 3.375 grams IV q8h (extended-infusion) /  vancomycin 1000 mg IV q48h.  Attending MD notes heel ulcer gangrenous and suspects osteomyelitis  but family has declined amputation and is reportedly considering moving toward comfort care.  Palliative care consult / family meeting pending.   Urine culture this admission negative (Final).  Blood cultures x2 no growth to date.  Afebrile  WBC slightly elevated, stable  Serum creatinine WNL, stable  Plan:  1. Continue Zosyn 3.375 grams IV q8h (extended-infusion) plus vancomycin 1 gram IV q48h 2. Await results from goals of care meeting.  If continued antibiotic therapy is desired, will plan to check vancomycin trough; may need dosage increase given suspected osteomyelitis.  Clayburn Pert, PharmD, BCPS Pager: [phone] 12/06/2013  1:40 PM

## 2013-12-06 NOTE — Care Management Note (Unsigned)
    Page 1 of 1   12/06/2013     10:53:48 AM CARE MANAGEMENT NOTE 12/06/2013  Patient:  Julie Frank, Julie Frank   Account Number:  [id number]  Date Initiated:  12/06/2013  Documentation initiated by:  [facility]  Subjective/Objective Assessment:   80 year old female admitted with Chronic heel ulcer with probable underlying osteomyelitis/chronic infection in the setting of chronic infections in adult failure to thrive.     Action/Plan:   From [facility], will return there at d/c.   Anticipated DC Date:  12/09/2013   Anticipated DC Plan:  SKILLED NURSING FACILITY  In-house referral  Clinical Social Worker      DC Planning Services  CM consult      Choice offered to / List presented to:             Status of service:  Completed, signed off Medicare Important Message given?   (If response is "NO", the following Medicare IM given date fields will be blank) Date Medicare IM given:   Medicare IM given by:   Date Additional Medicare IM given:   Additional Medicare IM given by:    Discharge Disposition:    Per UR Regulation:  Reviewed for med. necessity/level of care/duration of stay  If discussed at [place] of Stay Meetings, dates discussed:    Comments:

## 2013-12-06 NOTE — Progress Notes (Signed)
Clinical Social Work  CSW received a call from [facility] reporting they would be unable to accept patient back because they can no longer handle patient's needs. CSW went to room but no family present. CSW attempted to call patient's dtr three times but no answer on phone and unable to leave a message. CSW will continue to follow to assist with DC plans.  [place], [place] [phone]

## 2013-12-06 NOTE — Progress Notes (Signed)
[facility] PROGRESS NOTE  Julie Frank FVC:[id number] DOB: [birth year] DOA: 12/03/2013 PCP: Hennie Duos, MD  Assessment/Plan  Chronic heel ulcer and gangrene with probable underlying osteomyelitis.  Have recommended that Ms. Sisneros either have ongoing abx to treat her infection with risk of infectious diarrhea, kidney damage, or other toxicities or become full comfort measures and stop antibiotics.  I feel that if antibiotics were discotninued, that she would quickly decline with sepsis and gangrene and would repeat the previous cycle that led to this admission.  I recommended against amputation at this time because of her mother's frailty and feel that she would either die in surgery or have a complicated and painful recovery, drastically decreasing her quality of life. Julie Frank asked pointed questions about whether her mother's PVD should have been caught earlier and whether malnutrition and dehydration caused her condition.   I spent > 45 minutes with the patient and Julie Frank answering questions again today.  Julie Frank is having a hard time accepting what is happening to her mother and is trying to exhaust every option.  She appreciated her conversation with Dr. Lovena Le today who offered similar recommendations.  Despite our and Dr. Doug Sou recommendations, the daughter is still considering amputation and would like to speak face-to-face with Dr. Oneida Alar.  I have spoken with Dr. Donnetta Hutching and he is going to notify Dr. Oneida Alar that Julie Frank would like to speak with him.  Julie Frank is willing to go to their office or to [facility] to speak with them -  ESR 107 -  Continue vancomycin and zosyn day 4 -  Blood cultures NGTD -  Appreciate Wound care assistance -  Dry dressings daily and as needed for now -  Appreciate Dr. Tanna Furry assistance  PAD (peripheral artery disease) with gangrene -  Seen by Dr. Christean Grief, see above  Altered mental status in the setting of vascular dementia, improved on broad  spectrum antibiotics -  Likely secondary to smoldering infection and inflammation  Protein-calorie malnutrition, severe, eating better recently -  Appreciate nutrition recommendations -  Continue dysphagia 1 with nectar thick liquids -  Appreciate Speech therapy assistance  Pulmonary edema due to acute on chronic diastolic heart failure vs. HCAP vs. Aspiration pneumonia, breathing much more comfortable -  Continue abx  -  Encourage PO intake -  No IVF  Leukocytosis due to osteo and gangrene, trended down slightly   Normocytic anemia likely due to chronic disease, hgb approximately stable  Thrombocytosis, acute phase reactant, plt decreasing -  Lab holiday tomorrow  Diet:  Dysphagia 2 Access:  PIV IVF:  off Proph:  lovenox  Code Status: DNR Family Communication: spoke with Daughter Julie Frank [phone].  Please call on the daily basis.   Disposition Plan:  Continue IV antibiotics for now, continue pain management.  Continue goals of care conversation.  Daughter choosing between SNF with lifelong antibiotics vs. No abx and [facility] vs. Amputation   Consultants:  Palliative care, Dr. Lovena Le  Procedures:  CXR  Antibiotics:  vanc 9/11 >>   Zosyn 9/11 >>  HPI/Subjective:   HOH, shouted questions to left ear.  Denies pain, SOB, vomiting.  Tolerating pureed diet much better.    Objective: Filed Vitals:   12/05/13 1444 12/05/13 2047 12/06/13 0602 12/06/13 1424  BP: 125/71 115/48 125/71 117/53  Pulse: 91 48 81 75  Temp: 97.6 F (36.4 C) 97.9 F (36.6 C) 97.4 F (36.3 C) 97.9 F (36.6 C)  TempSrc: Oral Oral Axillary Oral  Resp: 21 23  22 20  Height:      Weight:      SpO2: 94% 96% 93% 96%    Intake/Output Summary (Last 24 hours) at 12/06/13 1737 Last data filed at 12/06/13 1713  Gross per 24 hour  Intake    600 ml  Output    875 ml  Net   -275 ml   Filed Weights   12/03/13 1500  Weight: 52.6 kg (115 lb 15.4 oz)    Exam:   General:  WF, asleep  and easily arousable  HEENT:  NCAT, MMM  Cardiovascular:  RRR, nl S1, S2 no mrg, 2+ pulses, warm extremities  Respiratory:  CTAB anteriorly, no increased WOB  Abdomen:   NABS, soft, NT/ND  MSK:   Increased tone, decrease muscle mass, left heel necrotic with surrounding erythema, purulence and foul odor.  Tip of left big toe with possibly some dry gangrene.  Several erythematous spots that may be early gangrene on lateral aspect of foot in addition to the 2cm necrotic area on the left lateral aspect of her foot which still has surrounding erythema.  Minimal to no improvement overall in erythema with antibiotics.    Data Reviewed: Basic Metabolic Panel:  Recent Labs Lab 12/03/13 1407 12/05/13 0518 12/06/13 0503  NA 143 147 144  K 4.4 4.0 4.0  CL 108 107 108  CO2 24 28 25   GLUCOSE 98 93 104*  BUN 20 16 16   CREATININE 0.51 0.57 0.53  CALCIUM 8.5 8.5 8.3*   Liver Function Tests: No results found for this basename: AST, ALT, ALKPHOS, BILITOT, PROT, ALBUMIN,  in the last 168 hours No results found for this basename: LIPASE, AMYLASE,  in the last 168 hours No results found for this basename: AMMONIA,  in the last 168 hours CBC:  Recent Labs Lab 12/03/13 1407 12/05/13 0518 12/06/13 0503  WBC 12.9* 11.6* 11.6*  NEUTROABS 11.0*  --   --   HGB 11.4* 11.0* 10.8*  HCT 35.8* 36.3 34.9*  MCV 90.9 93.6 91.8  PLT 416* 464* 408*   Cardiac Enzymes:  Recent Labs Lab 12/03/13 1407  TROPONINI <0.30   BNP (last 3 results) No results found for this basename: PROBNP,  in the last 8760 hours CBG: No results found for this basename: GLUCAP,  in the last 168 hours  Recent Results (from the past 240 hour(s))  CULTURE, BLOOD (ROUTINE X 2)     Status: None   Collection Time    12/03/13  2:06 PM      Result Value Ref Range Status   Specimen Description BLOOD RIGHT FOREARM   Final   Special Requests BOTTLES DRAWN AEROBIC AND ANAEROBIC 5ML   Final   Culture  Setup Time     Final    Value: 12/03/2013 17:24     Performed at [facility]   Culture     Final   Value:        BLOOD CULTURE RECEIVED NO GROWTH TO DATE CULTURE WILL BE HELD FOR 5 DAYS BEFORE ISSUING A FINAL NEGATIVE REPORT     Performed at [facility]   Report Status PENDING   Incomplete  CULTURE, BLOOD (ROUTINE X 2)     Status: None   Collection Time    12/03/13  2:51 PM      Result Value Ref Range Status   Specimen Description BLOOD BLOOD LEFT FOREARM   Final   Special Requests BOTTLES DRAWN AEROBIC AND ANAEROBIC 3ML   Final  Culture  Setup Time     Final   Value: 12/03/2013 17:25     Performed at [facility]   Culture     Final   Value:        BLOOD CULTURE RECEIVED NO GROWTH TO DATE CULTURE WILL BE HELD FOR 5 DAYS BEFORE ISSUING A FINAL NEGATIVE REPORT     Performed at [facility]   Report Status PENDING   Incomplete  URINE CULTURE     Status: None   Collection Time    12/04/13  1:40 PM      Result Value Ref Range Status   Specimen Description URINE, CATHETERIZED   Final   Special Requests NONE   Final   Culture  Setup Time     Final   Value: 12/04/2013 19:35     Performed at [facility]     Final   Value: NO GROWTH     Performed at [facility]   Culture     Final   Value: NO GROWTH     Performed at [facility]   Report Status 12/05/2013 FINAL   Final     Studies: No results found.  Scheduled Meds: . aspirin EC  325 mg Oral Daily  . docusate sodium  100 mg Oral Daily  . enoxaparin (LOVENOX) injection  30 mg Subcutaneous Q24H  . feeding supplement (ENSURE COMPLETE)  237 mL Oral BID BM  . feeding supplement (PRO-STAT SUGAR FREE 64)  30 mL Oral BID  . levothyroxine  25 mcg Oral QAC breakfast  .  morphine injection  0.5 mg Intravenous TID  . pantoprazole  40 mg Oral Daily  . piperacillin-tazobactam (ZOSYN)  IV  3.375 g Intravenous Q8H  . saccharomyces boulardii  250 mg Oral BID  . vancomycin  1,000 mg  Intravenous Q48H   Continuous Infusions:    Principal Problem:   Chronic heel ulcer with probable underlying osteomyelitis/chronic infection Active Problems:   PAD (peripheral artery disease)   Altered mental status   Protein-calorie malnutrition, severe   Adult failure to thrive   Recurrent infections    Time spent: 30 min    Ayrabella [place], [facility] Pager [phone]. If 7PM-7AM, please contact night-coverage at [email or web address], password [facility] 12/06/2013, 5:37 PM  LOS: 3 days

## 2013-12-06 NOTE — Progress Notes (Signed)
Patient ER:[id number] Julie Frank      DOB: February 23, 1910      YPP:[id number]  Consult received to continue goals of care conversation.  [name] remains remains torn about discontinuing vs contining antibiotics.  She states she feels that she is "pulling the plug" if she decides on stopping antibiotics and progressing on to comfort care.  She insists on speaking with vascular surgery face to face, specifically requesting Dr. Oneida Alar by name.  She in one breath states that she doesn't want to put her mother through surgery but she can't convince herself that she won't survive and have a meaningful few months.  In one breath we spoke about preparing for her mother's funeral which she states she does not have any funds to pay for and in the next breath she can't resolve the feelings of guilt over not "catching this sooner."   Patient herself is comfortable, pleasantly confused and not able to assist with goals of care.   Exam: 97.9    117/53 P: 75  R: 20 96 %  General: sleeping but rouses easily PERRL,EOMI, anicteric, MM dry Chest: decreased but clear anteriorly CVS: regular,S1,S2 Abd: soft, not tender or distended Ext: foul odor from foot, pictures reviewed Neuro: awake , alert but pleasantly confused.    Recommend:  1.  DNR remains in force  2.  Pain: patient denies pain but is on low dose scheduled morphine.  [name] is ok with this for now.  3.  Will check with social work on whether Karenann Cai would do antibiotics so that we can plan.  [name] may come around and decide on full comfort as she had been asking about [employer]. Also will ask about funeral support.  4.  Patient eating well . Continue with modified diet and aspiration precautions.   Will check with Dr. Sheran Fava regarding talking with vascular.   Ike Maragh L. Lovena Le, MD MBA The Palliative Medicine Team at [facility] Phone: [phone] Pager: [phone] ( Use team phone after hours)   Total time: 1240-155 pm

## 2013-12-06 NOTE — Telephone Encounter (Signed)
This encounter was created in error - please disregard.

## 2013-12-06 NOTE — Telephone Encounter (Signed)
Attempted to contact pt daughter by phone at 6 15 pm and 630 pm by phone will try to call pt room.  Ruta Hinds, MD [facility] [place] Office: [phone] Pager: [phone]

## 2013-12-06 NOTE — Telephone Encounter (Signed)
New message     Julie Frank is back in the hosp.  Daughter want to talk to [name]

## 2013-12-06 NOTE — Progress Notes (Signed)
Able to contact pt daughter in pt room.  I have scheduled a meeting with pts daughter at [place] pm  Ruta Hinds, MD [facility] [place]: [phone] Pager: [phone]

## 2013-12-06 NOTE — Progress Notes (Signed)
Clinical Social Work  CSW spoke with PMT MD (Dr. Lovena Le) who reports that dtr wants CSW to follow up with SNF to determine if patient can return with PMT following and IV antibiotics. CSW called and left another message with SNF and will await a return call. PMT MD reports that dtr is also interested in funeral costs resources. CSW will gather information and will continue to follow.  [place], [place] [phone]

## 2013-12-07 NOTE — Progress Notes (Signed)
Clinical Social Work  CSW continues to try and reach patient's dtr. No family at bedside. CSW has called dtr's number but no response and unable to leave a message. RN will contact CSW if dtr arrives at bedside.  [place], [place] [phone]

## 2013-12-07 NOTE — Progress Notes (Signed)
Clinical Social Work  CSW was able to reach dtr via phone. Dtr reports she is leaving to get to an appointment and cannot talk long. CSW explained that [facility] unable to accept patient back due to her level of care. Dtr reports she is unsure about what plans she wants at DC and has a meeting planned with PMT MD tomorrow. Dtr reports she wants to talk with doctors in order to make decisions about care and will call CSW back. Dtr does not want CSW to begin a SNF search at this time because she is not sure of plans. CSW will continue to follow and will await to hear from dtr.  [place], [place] [phone]

## 2013-12-07 NOTE — Telephone Encounter (Signed)
Received call from patient 12/06/13 she wanted Dr.Jordan to know mother recently in hospital.Stated mother has moved to [facility].No longer at [facility].Stated mother was dehydrated.She has no blood flow to her left foot.Left foot has gangrene.Stated she is in process of trying to make the right decision of surgery or comfort care.Stated she wanted to speak with Dr.Jordan.Dr.Jordan out of office this week.Stated she has appointment to meet with palliative care Dr.Taylor 12/06/13.Advised I will let Dr.Jordan know next week.

## 2013-12-07 NOTE — Progress Notes (Addendum)
[facility] PROGRESS NOTE  DAVIONNA BLACKSHER HUD:[id number] DOB: 22-Feb-1910 DOA: 12/03/2013 PCP: Hennie Duos, MD  Brief Summary  Julie Frank is an 80 y.o. female with multiple medical problems hospitalized 11/23/13-11/30/13 with sepsis from a urinary source (urine culture from 11/23/13 grew Proteus mirabilis) as well as chronic osteomyelitis secondary to a chronic heel wound in the setting of PVD, and was evaluated by Dr. Donnetta Hutching who offered AKA, which the patient/family declined. She was seen by the palliative care team, with DNR status confirmed, but with wish for ongoing treatment for infection/illness.  She presented with sepsis secondary to gangrene and osteo of the left foot.  Daughter is trying to decide between continuing antibiotics at SNF lifelong to suppress infection vs. Transfer to [place].  She is going to talk with Dr. Lovena Le again in AM and make her decision.     Assessment/Plan  Sepsis secondary to left heel ulcer and gangrene with osteomyelitis.  Have recommended that [name] either have ongoing abx to treat her infection with risk of infectious diarrhea, kidney damage, or other toxicities or become full comfort measures and stop antibiotics.  I feel that if antibiotics were discontinued, that she would quickly decline with sepsis and gangrene and would repeat the previous cycle that led to this admission.  If antibiotics were discontinued, I recommended entering hospice care with comfort measures only.  I recommended against amputation at this time because of her mother's frailty and feel that she would either die in surgery or have a complicated and painful recovery, drastically decreasing her quality of life. [name] asked pointed questions about whether her mother's PVD should have been caught earlier and whether malnutrition and dehydration caused her condition.   I have not spoken with [name] yet today as she is meeting at the vascular surgery office with Dr. Oneida Alar  to discuss the possibility of amputation again.  Dr. Lovena Le to follow up again today.   -  ESR 107 -  Continue vancomycin and zosyn day 5 -  Defer pain management to palliative care -  Blood cultures NGTD -  Appreciate Wound care assistance -  Dry dressings daily and as needed for now -  Appreciate Dr. Tanna Furry and Dr. Oneida Alar' assistance  PAD (peripheral artery disease) with gangrene -  Seen by Dr. Christean Grief, see above  Altered mental status in the setting of vascular dementia, improved on broad spectrum antibiotics, markedly improved.  Near baseline mentation -  Likely secondary to smoldering infection and inflammation  Protein-calorie malnutrition, severe, eating better recently -  Appreciate nutrition recommendations -  Continue dysphagia 1 with nectar thick liquids -  Appreciate Speech therapy assistance  Acute respiratory failure with pulmonary edema due to acute on chronic diastolic heart failure vs. HCAP vs. Aspiration pneumonia, breathing much more comfortably -  abx as above -  Wean O2 as tolerated  Leukocytosis due to osteo and gangrene, trended down slightly   Normocytic anemia likely due to chronic disease, hgb approximately stable  Thrombocytosis, acute phase reactant, plt decreasing   Diet:  Dysphagia 1 with nectar and ice chips Access:  PIV IVF:  off Proph:  lovenox  Code Status: DNR Family Communication: Please call [name] [phone] on a daily basis and allocate plenty of time for conversation  Disposition Plan:  Continue IV antibiotics for now, continue pain management.  Continue goals of care conversation.  Daughter choosing between SNF with lifelong antibiotics vs. No abx and [facility] vs. Amputation   Consultants:  Palliative care, Dr. Lovena Le  Procedures:  CXR  Antibiotics:  vanc 9/11 >>   Zosyn 9/11 >>  HPI/Subjective:   HOH, shouted questions to left ear.  Denies pain, SOB, vomiting.  Tolerating pureed diet.  Speech  therapy requesting ice chips also    Objective: Filed Vitals:   12/06/13 1424 12/06/13 2114 12/07/13 0621 12/07/13 1337  BP: 117/53 115/61 118/68 128/62  Pulse: 75 76 76 78  Temp: 97.9 F (36.6 C) 98.3 F (36.8 C) 97.5 F (36.4 C) 97.7 F (36.5 C)  TempSrc: Oral Oral Oral Oral  Resp: 20 20 20 16   Height:      Weight:      SpO2: 96% 97% 97% 99%    Intake/Output Summary (Last 24 hours) at 12/07/13 1444 Last data filed at 12/07/13 1300  Gross per 24 hour  Intake    220 ml  Output   1550 ml  Net  -1330 ml   Filed Weights   12/03/13 1500  Weight: 52.6 kg (115 lb 15.4 oz)    Exam:   General:  WF, awake, staring at TV which is off  HEENT:  NCAT, MMM  Cardiovascular:  RRR, nl S1, S2, 3/6 systolic murmur at apex/LSB, 2+ pulses, warm extremities  Respiratory:  Few anterior rales on right chest, no increased WOB  Abdomen:   NABS, soft, NT/ND  MSK:   Increased tone, decrease muscle mass, left heel necrotic with surrounding erythema, purulence and foul odor.  Tip of left big toe with possibly some dry gangrene.  Several erythematous spots that may be early gangrene on lateral aspect of foot in addition to the 2cm necrotic area on the left lateral aspect of her foot which still has surrounding erythema.  Minimal to no improvement overall in erythema with antibiotics.    Data Reviewed: Basic Metabolic Panel:  Recent Labs Lab 12/03/13 1407 12/05/13 0518 12/06/13 0503  NA 143 147 144  K 4.4 4.0 4.0  CL 108 107 108  CO2 24 28 25   GLUCOSE 98 93 104*  BUN 20 16 16   CREATININE 0.51 0.57 0.53  CALCIUM 8.5 8.5 8.3*   Liver Function Tests: No results found for this basename: AST, ALT, ALKPHOS, BILITOT, PROT, ALBUMIN,  in the last 168 hours No results found for this basename: LIPASE, AMYLASE,  in the last 168 hours No results found for this basename: AMMONIA,  in the last 168 hours CBC:  Recent Labs Lab 12/03/13 1407 12/05/13 0518 12/06/13 0503  WBC 12.9* 11.6* 11.6*   NEUTROABS 11.0*  --   --   HGB 11.4* 11.0* 10.8*  HCT 35.8* 36.3 34.9*  MCV 90.9 93.6 91.8  PLT 416* 464* 408*   Cardiac Enzymes:  Recent Labs Lab 12/03/13 1407  TROPONINI <0.30   BNP (last 3 results) No results found for this basename: PROBNP,  in the last 8760 hours CBG: No results found for this basename: GLUCAP,  in the last 168 hours  Recent Results (from the past 240 hour(s))  CULTURE, BLOOD (ROUTINE X 2)     Status: None   Collection Time    12/03/13  2:06 PM      Result Value Ref Range Status   Specimen Description BLOOD RIGHT FOREARM   Final   Special Requests BOTTLES DRAWN AEROBIC AND ANAEROBIC 5ML   Final   Culture  Setup Time     Final   Value: 12/03/2013 17:24     Performed at [facility]  Final   Value:        BLOOD CULTURE RECEIVED NO GROWTH TO DATE CULTURE WILL BE HELD FOR 5 DAYS BEFORE ISSUING A FINAL NEGATIVE REPORT     Performed at [facility]   Report Status PENDING   Incomplete  CULTURE, BLOOD (ROUTINE X 2)     Status: None   Collection Time    12/03/13  2:51 PM      Result Value Ref Range Status   Specimen Description BLOOD BLOOD LEFT FOREARM   Final   Special Requests BOTTLES DRAWN AEROBIC AND ANAEROBIC 3ML   Final   Culture  Setup Time     Final   Value: 12/03/2013 17:25     Performed at [facility]   Culture     Final   Value:        BLOOD CULTURE RECEIVED NO GROWTH TO DATE CULTURE WILL BE HELD FOR 5 DAYS BEFORE ISSUING A FINAL NEGATIVE REPORT     Performed at [facility]   Report Status PENDING   Incomplete  URINE CULTURE     Status: None   Collection Time    12/04/13  1:40 PM      Result Value Ref Range Status   Specimen Description URINE, CATHETERIZED   Final   Special Requests NONE   Final   Culture  Setup Time     Final   Value: 12/04/2013 19:35     Performed at [place]     Final   Value: NO GROWTH     Performed at [facility]   Culture      Final   Value: NO GROWTH     Performed at [facility]   Report Status 12/05/2013 FINAL   Final     Studies: No results found.  Scheduled Meds: . aspirin EC  325 mg Oral Daily  . docusate sodium  100 mg Oral Daily  . enoxaparin (LOVENOX) injection  30 mg Subcutaneous Q24H  . feeding supplement (ENSURE COMPLETE)  237 mL Oral BID BM  . feeding supplement (PRO-STAT SUGAR FREE 64)  30 mL Oral BID  . levothyroxine  25 mcg Oral QAC breakfast  .  morphine injection  0.5 mg Intravenous TID  . pantoprazole  40 mg Oral Daily  . piperacillin-tazobactam (ZOSYN)  IV  3.375 g Intravenous Q8H  . saccharomyces boulardii  250 mg Oral BID  . vancomycin  1,000 mg Intravenous Q48H   Continuous Infusions:    Principal Problem:   Chronic heel ulcer with probable underlying osteomyelitis/chronic infection Active Problems:   PAD (peripheral artery disease)   Altered mental status   Protein-calorie malnutrition, severe   Adult failure to thrive   Recurrent infections    Time spent: 30 min    Jasmeen Fritsch, [place] [facility] Pager [phone]. If 7PM-7AM, please contact night-coverage at [email or web address], password [facility] 12/07/2013, 2:44 PM  LOS: 4 days

## 2013-12-07 NOTE — Progress Notes (Signed)
Met with pt's daughter approximately 30 minutes today face to face.  Discussed prognosis for foot wound and options of above knee amputation vs comfort care.  I discussed with her quality and quantity of life issues.  At this point she seems to be comfortable with pursuing comfort care only and potentially hospice.  Ruta Hinds, MD [facility] [place] Office: [phone] Pager: [phone]

## 2013-12-07 NOTE — Progress Notes (Signed)
Speech Language Pathology Treatment: Dysphagia  Patient Details Name: Julie Frank MRN: [id number] DOB: Jul 05, 1909 Today's Date: 12/07/2013 Time: 1415-1430 SLP Time Calculation (min): 15 min  Assessment / Plan / Recommendation Clinical Impression  Per RN/nurse technician, pt with poor intake but adequate tolerance.  Pt does have h/o silent aspiration of thin barium via cup on MBS in 2014.    Xerostomia noted for which SLP offered ice chip boluses that pt was able to masticate adequately to mitigate.  Moistened graham cracker provided with pt demonstrating mastication and oral holding without transiting with poor awareness.  SLP used ice cream bolus to facilitate clearance.    Agree with recommendation for institutionalized feeding to include puree/nectar diet for improved efficiency and safety. If goal becomes comfort for this advanced age pt, advancement of diet may be indicated.  Recommend pt be allowed single ice boluses for oral hygiene/comfort.   Pt smiles frequently and answered questions with delay and nearly aphonia.  Will follow up x1 more for family education to increase pt comfort/tolerance of po.     HPI  see BSE   Pertinent Vitals Pain Assessment: No/denies pain  SLP Plan  Continue with current plan of care    Recommendations Diet recommendations: Dysphagia 1 (puree);Nectar-thick liquid (ice chips allowed ) Medication Administration: Whole meds with puree Supervision: Staff to assist with self feeding;Full supervision/cueing for compensatory strategies Compensations: Slow rate;Small sips/bites;Follow solids with liquid Postural Changes and/or Swallow Maneuvers: Seated upright 90 degrees;Upright 30-60 min after meal              Oral Care Recommendations: Oral care BID Follow up Recommendations: None Plan: Continue with current plan of care    [place], [place] [facility] SLP [phone]

## 2013-12-08 LAB — BASIC METABOLIC PANEL
Anion gap: 12 (ref 5–15)
BUN: 15 mg/dL (ref 6–23)
CHLORIDE: 108 meq/L (ref 96–112)
CO2: 24 mEq/L (ref 19–32)
Calcium: 8.5 mg/dL (ref 8.4–10.5)
Creatinine, Ser: 0.5 mg/dL (ref 0.50–1.10)
GFR calc Af Amer: 88 mL/min — ABNORMAL LOW (ref >90)
GFR, EST NON AFRICAN AMERICAN: 76 mL/min — AB (ref >90)
GLUCOSE: 93 mg/dL (ref 70–99)
POTASSIUM: 4.2 meq/L (ref 3.7–5.3)
SODIUM: 144 meq/L (ref 137–147)

## 2013-12-08 NOTE — Progress Notes (Signed)
Clinical Social Work  CSW received message from attending MD (Dr. Sheran Fava) to offer hospice choice and that family was interested in [facility]. CSW went to room but no family present. CSW spoke with dtr via phone who reports that she met with MD yesterday and patient will not have surgery. Dtr reports that she plans to meet with PMT MD this afternoon in order to discuss final plans but knows that patient cannot return to SNF with her needs. Dtr is interested in [facility] but wants to wait to talk with any representatives until she meets with PMT MD. CSW made referral to Harmon Pier at [facility] and explained dtr's request. CSW will follow up after dtr meets with PMT.  [place], [place] [phone]

## 2013-12-08 NOTE — Progress Notes (Signed)
Patient Julie Frank      DOB: [birth year]      PJA:[id number]   Extensive follow up session with Julie Frank.  She has had all her questions answered and now feels that it would be ok to transition to comfort care at discharge to hospice home.  Plan to continue antibiotics till she leaves the hospital.  Julie Frank is best reached between 8 am and 11 at home .She doesn't have a cell phone.  She still struggles with her relationship with her brother who has been less than attentive to her parents and and has not acted kindly toward her for many years.  Daughter  has POA but updated patient's DSS  Education officer, museum.   Sibyl Mikula L. Lovena Le, MD MBA The Palliative Medicine Team at [facility] Phone: [phone] Pager: [phone] ( Use team phone after hours)

## 2013-12-08 NOTE — Progress Notes (Signed)
Clinical Social Work  PMT MD reports patient's dtr is agreeable to full comfort care. CSW alerted [facility] who will review chart to determine if patient is eligible. CSW will continue to follow.  [place], [place] [phone]

## 2013-12-08 NOTE — Progress Notes (Signed)
[facility] PROGRESS NOTE  Julie Frank XTK:[id number] DOB: [birth year] DOA: 12/03/2013 PCP: Hennie Duos, Julie Frank  Brief Summary  Julie Frank is an 80 y.o. female with multiple medical problems hospitalized 11/23/13-11/30/13 with sepsis from a urinary source (urine culture from 11/23/13 grew Proteus mirabilis) as well as chronic osteomyelitis secondary to a chronic heel wound in the setting of PVD, and was evaluated by Dr. Donnetta Hutching who offered AKA, which the patient/family declined. She was seen by the palliative care team, with DNR status confirmed, but with wish for ongoing treatment for infection/illness.  She presented with sepsis secondary to gangrene and osteo of the left foot.  Daughter is trying to decide between continuing antibiotics at SNF lifelong to suppress infection vs. Transfer to [place].  She is awaiting f/u with Palliative Care Julie Frank to make her decision.     Assessment/Plan  Sepsis secondary to left heel ulcer and gangrene with osteomyelitis.  Have recommended that Julie Frank either have ongoing abx to treat her infection with risk of infectious diarrhea, kidney damage, or other toxicities or become full comfort measures and stop antibiotics. It is felt that if antibiotics were discontinued, that she would quickly decline with sepsis and gangrene and would repeat the previous cycle that led to this admission.  If antibiotics were discontinued, it has been recommended entering hospice care with comfort measures only.  It has been recommended against amputation at this time because of her mother's frailty and feel that she would either die in surgery or have a complicated and painful recovery, drastically decreasing her quality of life. Julie Frank asked pointed questions to Dr. Sheran Fava about whether her mother's PVD should have been caught earlier and whether malnutrition and dehydration caused her condition. Dr. Lovena Le to follow up.  Dr. Oneida Alar, vascular surgeon met with patient and  family on 9/15 and family agreed on pursuing comfort care only and potentially hospice. -  ESR 107 -  Continue vancomycin and zosyn day 6 -  Defer pain management to palliative care -  Blood cultures NGTD -  Appreciate Wound care assistance -  Dry dressings daily and as needed for now -  Appreciate Dr. Tanna Furry and Dr. Oneida Alar' assistance  PAD (peripheral artery disease) with gangrene -  Seen by Dr. Christean Grief, see above  Altered mental status in the setting of vascular dementia, improved on broad spectrum antibiotics, markedly improved.  Near baseline mentation -  Likely secondary to smoldering infection and inflammation  Protein-calorie malnutrition, severe, eating better recently -  Appreciate nutrition recommendations -  Continue dysphagia 1 with nectar thick liquids -  Appreciate Speech therapy assistance  Acute respiratory failure with pulmonary edema due to acute on chronic diastolic heart failure vs. HCAP vs. Aspiration pneumonia, breathing much more comfortably -  abx as above -  Wean O2 as tolerated  Leukocytosis due to osteo and gangrene, trended down slightly   Normocytic anemia likely due to chronic disease, hgb approximately stable  Thrombocytosis, acute phase reactant, plt decreasing   Diet:  Dysphagia 1 with nectar and ice chips Access:  PIV IVF:  off Proph:  lovenox  Code Status: DNR Family Communication: None at bedside  Disposition Plan:  Continue IV antibiotics for now, continue pain management.  Continue goals of care conversation.  Daughter choosing between SNF with lifelong antibiotics vs. No abx and [facility] vs. Amputation   Consultants:  Palliative care, Dr. Lovena Le  VVS  Procedures:  CXR  Antibiotics:  vanc 9/11 >>   Zosyn 9/11 >>  HPI/Subjective:   Patient denies complaints. As per nursing, no acute events and family plans to meet with palliative care team this morning.  Objective: Filed Vitals:   12/07/13 1337 12/07/13  2044 12/08/13 0618 12/08/13 1344  BP: 128/62 121/51 130/61 120/51  Pulse: 78  78 75  Temp: 97.7 F (36.5 C) 97.7 F (36.5 C) 97.4 F (36.3 C) 97.8 F (36.6 C)  TempSrc: Oral Oral Axillary Oral  Resp: 16 16 18 16   Height:      Weight:      SpO2: 99% 96% 98% 95%    Intake/Output Summary (Last 24 hours) at 12/08/13 1512 Last data filed at 12/08/13 0900  Gross per 24 hour  Intake    330 ml  Output   1200 ml  Net   -870 ml   Filed Weights   12/03/13 1500  Weight: 52.6 kg (115 lb 15.4 oz)    Exam:   General:  Pleasant elderly frail female, chronically ill-looking cachectic, lying comfortably in bed.  Cardiovascular:  RRR, nl S1, S2, 3/6 systolic murmur at apex/LSB, 2+ pulses, warm extremities  Respiratory:  Few anterior rales on right chest, no increased WOB  Abdomen:   NABS, soft, NT/ND  MSK:   Increased tone, decrease muscle mass, left heel necrotic with surrounding erythema, purulence and foul odor.  Tip of left big toe with possibly some dry gangrene.  Several erythematous spots that may be early gangrene on lateral aspect of foot in addition to the 2cm necrotic area on the left lateral aspect of her foot which still has surrounding erythema.  Minimal to no improvement overall in erythema with antibiotics.    Data Reviewed: Basic Metabolic Panel:  Recent Labs Lab 12/03/13 1407 12/05/13 0518 12/06/13 0503 12/08/13 0648  NA 143 147 144 144  K 4.4 4.0 4.0 4.2  CL 108 107 108 108  CO2 24 28 25 24   GLUCOSE 98 93 104* 93  BUN 20 16 16 15   CREATININE 0.51 0.57 0.53 0.50  CALCIUM 8.5 8.5 8.3* 8.5   Liver Function Tests: No results found for this basename: AST, ALT, ALKPHOS, BILITOT, PROT, ALBUMIN,  in the last 168 hours No results found for this basename: LIPASE, AMYLASE,  in the last 168 hours No results found for this basename: AMMONIA,  in the last 168 hours CBC:  Recent Labs Lab 12/03/13 1407 12/05/13 0518 12/06/13 0503  WBC 12.9* 11.6* 11.6*  NEUTROABS  11.0*  --   --   HGB 11.4* 11.0* 10.8*  HCT 35.8* 36.3 34.9*  MCV 90.9 93.6 91.8  PLT 416* 464* 408*   Cardiac Enzymes:  Recent Labs Lab 12/03/13 1407  TROPONINI <0.30   BNP (last 3 results) No results found for this basename: PROBNP,  in the last 8760 hours CBG: No results found for this basename: GLUCAP,  in the last 168 hours  Recent Results (from the past 240 hour(s))  CULTURE, BLOOD (ROUTINE X 2)     Status: None   Collection Time    12/03/13  2:06 PM      Result Value Ref Range Status   Specimen Description BLOOD RIGHT FOREARM   Final   Special Requests BOTTLES DRAWN AEROBIC AND ANAEROBIC 5ML   Final   Culture  Setup Time     Final   Value: 12/03/2013 17:24     Performed at [facility]   Culture     Final   Value:  BLOOD CULTURE RECEIVED NO GROWTH TO DATE CULTURE WILL BE HELD FOR 5 DAYS BEFORE ISSUING A FINAL NEGATIVE REPORT     Performed at [facility]   Report Status PENDING   Incomplete  CULTURE, BLOOD (ROUTINE X 2)     Status: None   Collection Time    12/03/13  2:51 PM      Result Value Ref Range Status   Specimen Description BLOOD BLOOD LEFT FOREARM   Final   Special Requests BOTTLES DRAWN AEROBIC AND ANAEROBIC 3ML   Final   Culture  Setup Time     Final   Value: 12/03/2013 17:25     Performed at [facility]   Culture     Final   Value:        BLOOD CULTURE RECEIVED NO GROWTH TO DATE CULTURE WILL BE HELD FOR 5 DAYS BEFORE ISSUING A FINAL NEGATIVE REPORT     Performed at [facility]   Report Status PENDING   Incomplete  URINE CULTURE     Status: None   Collection Time    12/04/13  1:40 PM      Result Value Ref Range Status   Specimen Description URINE, CATHETERIZED   Final   Special Requests NONE   Final   Culture  Setup Time     Final   Value: 12/04/2013 19:35     Performed at [place]     Final   Value: NO GROWTH     Performed at [facility]   Culture     Final   Value:  NO GROWTH     Performed at [facility]   Report Status 12/05/2013 FINAL   Final     Studies: No results found.  Scheduled Meds: . aspirin EC  325 mg Oral Daily  . docusate sodium  100 mg Oral Daily  . enoxaparin (LOVENOX) injection  30 mg Subcutaneous Q24H  . feeding supplement (ENSURE COMPLETE)  237 mL Oral BID BM  . feeding supplement (PRO-STAT SUGAR FREE 64)  30 mL Oral BID  . levothyroxine  25 mcg Oral QAC breakfast  .  morphine injection  0.5 mg Intravenous TID  . pantoprazole  40 mg Oral Daily  . piperacillin-tazobactam (ZOSYN)  IV  3.375 g Intravenous Q8H  . saccharomyces boulardii  250 mg Oral BID  . vancomycin  1,000 mg Intravenous Q48H   Continuous Infusions:    Principal Problem:   Chronic heel ulcer with probable underlying osteomyelitis/chronic infection Active Problems:   PAD (peripheral artery disease)   Altered mental status   Protein-calorie malnutrition, severe   Adult failure to thrive   Recurrent infections    Time spent: 30 min   Julie Hackley, Julie Frank, FACP, FHM. [facility] Pager [phone]  If 7PM-7AM, please contact night-coverage [email or web address] Password [email or web address] 12/08/2013, 3:19 PM    LOS: 5 days

## 2013-12-09 LAB — CULTURE, BLOOD (ROUTINE X 2)
CULTURE: NO GROWTH
Culture: NO GROWTH

## 2013-12-09 NOTE — Consult Note (Signed)
[facility] Liaison: [place] room available for patient today 12/09/2013. Will update CSW and make contact with family at 8:30 to make arrangements to complete paper work at hospital prior to transfer. Will need DC summary faxed to [phone]. RN please call report to [phone]. Will make CSW aware when paper work complete. Thank you. Erling Conte LCsW [phone]

## 2013-12-09 NOTE — Progress Notes (Signed)
ANTIBIOTIC CONSULT NOTE - FOLLOW UP  Pharmacy Consult for vancomycin, Zosyn Indication: chronic heel ulcer with gangrene and probable underlying osteomyelitis  No Known Allergies  Patient Measurements: Height: 4' 11.84" (152 cm) Weight: 115 lb 15.4 oz (52.6 kg) IBW/kg (Calculated) : 45.14   Vital Signs: Temp: 97.2 F (36.2 C) (09/17 1100) Temp src: Oral (09/17 1100) BP: 127/58 mmHg (09/17 1100) Pulse Rate: 73 (09/17 1100) Intake/Output from previous day: 09/16 0701 - 09/17 0700 In: 440 [P.O.:440] Out: 650 [Urine:650] Intake/Output from this shift: Total I/O In: 390 [P.O.:390] Out: 300 [Urine:300]  Labs:  Recent Labs  12/08/13 0648  CREATININE 0.50   Estimated Creatinine Clearance: 25.3 ml/min (by C-G formula based on Cr of 0.5). No results found for this basename: VANCOTROUGH, VANCOPEAK, VANCORANDOM, [place], GENTPEAK, [place], [place], TOBRAPEAK, TOBRARND, AMIKACINPEAK, AMIKACINTROU, AMIKACIN,  in the last 72 hours   Microbiology: Recent Results (from the past 720 hour(s))  URINE CULTURE     Status: None   Collection Time    11/23/13 10:45 PM      Result Value Ref Range Status   Specimen Description URINE, CATHETERIZED   Final   Special Requests NONE   Final   Culture  Setup Time     Final   Value: 11/23/2013 02:00     Performed at [place]     Final   Value: >=100,000 COLONIES/ML     Performed at [facility]   Culture     Final   Value: PROTEUS MIRABILIS     Performed at [facility]   Report Status 11/26/2013 FINAL   Final   Organism ID, Bacteria PROTEUS MIRABILIS   Final  CULTURE, BLOOD (ROUTINE X 2)     Status: None   Collection Time    11/24/13 12:42 AM      Result Value Ref Range Status   Specimen Description BLOOD LEFT WRIST   Final   Special Requests BOTTLES DRAWN AEROBIC ONLY 10CC   Final   Culture  Setup Time     Final   Value: 11/24/2013 08:24     Performed at [facility]   Culture     Final   Value: NO GROWTH 5 DAYS     Performed at [facility]   Report Status 11/30/2013 FINAL   Final  CULTURE, BLOOD (ROUTINE X 2)     Status: None   Collection Time    11/24/13 12:45 AM      Result Value Ref Range Status   Specimen Description BLOOD RIGHT ARM   Final   Special Requests BOTTLES DRAWN AEROBIC AND ANAEROBIC [facility]   Final   Culture  Setup Time     Final   Value: 11/24/2013 08:25     Performed at [facility]   Culture     Final   Value: NO GROWTH 5 DAYS     Performed at [facility]   Report Status 11/30/2013 FINAL   Final  CULTURE, BLOOD (ROUTINE X 2)     Status: None   Collection Time    12/03/13  2:06 PM      Result Value Ref Range Status   Specimen Description BLOOD RIGHT FOREARM   Final   Special Requests BOTTLES DRAWN AEROBIC AND ANAEROBIC 5ML   Final   Culture  Setup Time     Final   Value: 12/03/2013 17:24     Performed at [facility]  Culture     Final   Value: NO GROWTH 5 DAYS     Performed at [facility]   Report Status 12/09/2013 FINAL   Final  CULTURE, BLOOD (ROUTINE X 2)     Status: None   Collection Time    12/03/13  2:51 PM      Result Value Ref Range Status   Specimen Description BLOOD BLOOD LEFT FOREARM   Final   Special Requests BOTTLES DRAWN AEROBIC AND ANAEROBIC 3ML   Final   Culture  Setup Time     Final   Value: 12/03/2013 17:25     Performed at [facility]   Culture     Final   Value: NO GROWTH 5 DAYS     Performed at [facility]   Report Status 12/09/2013 FINAL   Final  URINE CULTURE     Status: None   Collection Time    12/04/13  1:40 PM      Result Value Ref Range Status   Specimen Description URINE, CATHETERIZED   Final   Special Requests NONE   Final   Culture  Setup Time     Final   Value: 12/04/2013 19:35     Performed at [place]     Final   Value: NO GROWTH     Performed at [facility]   Culture     Final    Value: NO GROWTH     Performed at [facility]   Report Status 12/05/2013 FINAL   Final    Anti-infectives   Start     Dose/Rate Route Frequency Ordered Stop   12/05/13 1600  vancomycin (VANCOCIN) IVPB 1000 mg/200 mL premix     1,000 mg 200 mL/hr over 60 Minutes Intravenous Every 48 hours 12/03/13 1541     12/04/13 0000  piperacillin-tazobactam (ZOSYN) IVPB 3.375 g     3.375 g 12.5 mL/hr over 240 Minutes Intravenous Every 8 hours 12/03/13 1541     12/03/13 1700  vancomycin (VANCOCIN) IVPB 1000 mg/200 mL premix     1,000 mg 200 mL/hr over 60 Minutes Intravenous  Once 12/03/13 1633 12/03/13 1848   12/03/13 1545  vancomycin (VANCOCIN) IVPB 1000 mg/200 mL premix  Status:  Discontinued     1,000 mg 200 mL/hr over 60 Minutes Intravenous  Once 12/03/13 1540 12/03/13 1633   12/03/13 1545  piperacillin-tazobactam (ZOSYN) IVPB 3.375 g     3.375 g 100 mL/hr over 30 Minutes Intravenous  Once 12/03/13 1540 12/03/13 1627      Assessment: 80 y/o F with multiple medical problems, hospitalized 11/23/13-11/30/13 with urosepsis and possible chronic osteomyelitis secondary to chronic 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  Patient and family declined vascular surgery recommendation for AKA.   Patient was readmitted 12/03/13 with AMS, low grade fever, and worsening heel wound. Empiric Zosyn and vancomycin were started with pharmacy dosing assistance requested  Goal of Therapy:  Vancomycin trough 10-15 if no osteomyelitis, 15-20 if osteomyelitis present  9/17:  D7 Vancomycin 1g IV q48/Zosyn EI q8. Plan is for full comfort care, to transfer to [facility] and continue abx until discharge.  Urine culture this admission negative (Final).  Blood cultures x2 no growth to date.  Afebrile  WBC slightly elevated, stable  Serum creatinine WNL, stable  Plan:  1. Continue Zosyn 3.375 grams IV q8h (extended-infusion) plus vancomycin 1 gram IV q48h until discharge.   Kizzie Furnish, PharmD  Pager:  [phone] 12/09/2013 1:38 PM

## 2013-12-09 NOTE — Progress Notes (Signed)
Nutrition Brief Note  Chart reviewed. Pt now transitioning to comfort care.  No further nutrition interventions warranted at this time.  Please re-consult as needed.   Carlis Stable MS, [place], LDN [phone] Pager [phone] Weekend/After Hours Pager

## 2013-12-09 NOTE — Progress Notes (Signed)
CARE MANAGEMENT NOTE 12/09/2013  Patient:  Julie Frank, Julie Frank   Account Number:  [id number]  Date Initiated:  12/06/2013  Documentation initiated by:  [facility]  Subjective/Objective Assessment:   80 year old female admitted with Chronic heel ulcer with probable underlying osteomyelitis/chronic infection in the setting of chronic infections in adult failure to thrive.     Action/Plan:   From [facility]in Flock. Will d/c to [facility] when medically ready.   Anticipated DC Date:  12/10/2013   Anticipated DC Plan:  SKILLED NURSING FACILITY  In-house referral  Clinical Social Worker      DC Planning Services  CM consult      Choice offered to / List presented to:             Status of service:  Completed, signed off Medicare Important Message given?  YES (If response is "NO", the following Medicare IM given date fields will be blank) Date Medicare IM given:  12/09/2013 Medicare IM given by:  [facility] Date Additional Medicare IM given:   Additional Medicare IM given by:    Discharge Disposition:    Per UR Regulation:  Reviewed for med. necessity/level of care/duration of stay

## 2013-12-09 NOTE — Progress Notes (Signed)
SLP Cancellation Note  Patient Details Name: Julie Frank MRN: [id number] DOB: 03/13/1910   Cancelled treatment:       Reason Eval/Treat Not Completed:  (per chart, pt now comfort care and is to dc to [facility], will sign off, please reconsult if indicated.)   Luanna Salk, [place] [facility] SLP [phone]  ALL NOTES:[phone]}

## 2013-12-09 NOTE — Progress Notes (Signed)
Clinical Social Work  CSW spoke with Harmon Pier from [facility] who reports bed available at [facility] today but dtr needs to complete paperwork prior transfer. CSW spoke with dtr re: paperwork needs and dtr reports she has already spoken to Harmon Pier and MD about paperwork. Dtr reports she has no other support and cannot complete paperwork today. Dtr has made an appointment with Harmon Pier to complete paperwork at 8:30am on 9/18 and then patient will be transferred to [facility]. Dtr reports she is overwhelmed and wants to make the best decisions for patient but has important meetings that she cannot miss today. Dtr aware that patient will transfer tomorrow and reports she will follow patient to [facility].  CSW will continue to follow.  [place], [place] [phone]

## 2013-12-09 NOTE — Progress Notes (Signed)
[facility] PROGRESS NOTE  Julie Frank HOO:[id number] DOB: February 03, 1910 DOA: 12/03/2013 PCP: Hennie Duos, MD  Brief Summary  Julie Frank is an 80 y.o. female with multiple medical problems hospitalized 11/23/13-11/30/13 with sepsis from a urinary source (urine culture from 11/23/13 grew Proteus mirabilis) as well as chronic osteomyelitis secondary to a chronic heel wound in the setting of PVD, and was evaluated by Dr. Donnetta Hutching who offered AKA, which the patient/family declined. She was seen by the palliative care team, with DNR status confirmed, but with wish for ongoing treatment for infection/illness.  She presented with sepsis secondary to gangrene and osteo of the left foot.  Daughter met with Dr. Lovena Le on 9/16 and agreeable for patient's transition to full comfort care and transfer to [facility] place on 9/18.  Assessment/Plan  Sepsis secondary to left heel ulcer and gangrene with osteomyelitis.  Have recommended that [name] either have ongoing abx to treat her infection with risk of infectious diarrhea, kidney damage, or other toxicities or become full comfort measures and stop antibiotics. It is felt that if antibiotics were discontinued, that she would quickly decline with sepsis and gangrene and would repeat the previous cycle that led to this admission.  If antibiotics were discontinued, it has been recommended entering hospice care with comfort measures only.  It has been recommended against amputation at this time because of her mother's frailty and feel that she would either die in surgery or have a complicated and painful recovery, drastically decreasing her quality of life. Julie Frank asked pointed questions to Dr. Sheran Fava about whether her mother's PVD should have been caught earlier and whether malnutrition and dehydration caused her condition. Dr. Lovena Le to follow up.  Dr. Oneida Alar, vascular surgeon met with patient and family on 9/15 and family agreed on pursuing comfort care  only and potentially hospice. -  ESR 107 -  Continue vancomycin and zosyn day 7 -  Defer pain management to palliative care -  Blood cultures NGTD -  Appreciate Wound care assistance -  Dry dressings daily and as needed for now -  Appreciate Dr. Tanna Furry and Dr. Oneida Alar' assistance - Continue IV antibiotics until discharge to [facility] place.  PAD (peripheral artery disease) with gangrene -  Seen by Dr. Christean Grief, see above  Altered mental status in the setting of vascular dementia, improved on broad spectrum antibiotics, markedly improved.  Near baseline mentation -  Likely secondary to smoldering infection and inflammation  Protein-calorie malnutrition, severe, eating better recently -  Appreciate nutrition recommendations -  Continue dysphagia 1 with nectar thick liquids -  Appreciate Speech therapy assistance  Acute respiratory failure with pulmonary edema due to acute on chronic diastolic heart failure vs. HCAP vs. Aspiration pneumonia, breathing much more comfortably -  abx as above -  Wean O2 as tolerated  Leukocytosis due to osteo and gangrene, trended down slightly   Normocytic anemia likely due to chronic disease, hgb approximately stable  Thrombocytosis, acute phase reactant, plt decreasing   Diet:  Dysphagia 1 with nectar and ice chips Access:  PIV IVF:  off Proph:  lovenox  Code Status: DNR Family Communication: Discussed extensively with [name] on 9/17. Disposition Plan:  DC to [facility] place on 9/18. The daughter is unable to complete the necessary paperwork in a timely manner on 9/17 for transfer today.   Consultants:  Palliative care, Dr. Lovena Le  VVS  Procedures:  CXR  Antibiotics:  vanc 9/11 >>   Zosyn 9/11 >>  HPI/Subjective:   Patient sleeping comfortably this morning. Did not attempt  Objective: Filed Vitals:   12/09/13 0700 12/09/13 0710 12/09/13 1100 12/09/13 1300  BP: 142/68 142/68 127/58 125/73   Pulse: 62 62 73 66  Temp: 97.4 F (36.3 C) 97.4 F (36.3 C) 97.2 F (36.2 C) 97.4 F (36.3 C)  TempSrc: Oral Oral Oral Oral  Resp: 20 20 18 28   Height:      Weight:      SpO2: 97% 97% 97% 95%    Intake/Output Summary (Last 24 hours) at 12/09/13 1541 Last data filed at 12/09/13 1418  Gross per 24 hour  Intake    680 ml  Output    900 ml  Net   -220 ml   Filed Weights   12/03/13 1500  Weight: 52.6 kg (115 lb 15.4 oz)    Exam:   General:  Pleasant elderly frail female, chronically ill-looking cachectic, sleeping comfortably in bed.  Cardiovascular:  RRR, nl S1, S2, 3/6 systolic murmur at apex/LSB, 2+ pulses, warm extremities  Respiratory:  Few anterior rales on right chest, no increased WOB  Abdomen:   NABS, soft, NT/ND  MSK:   Increased tone, decrease muscle mass, left heel necrotic with surrounding erythema, purulence and foul odor.  Tip of left big toe with possibly some dry gangrene.  Several erythematous spots that may be early gangrene on lateral aspect of foot in addition to the 2cm necrotic area on the left lateral aspect of her foot which still has surrounding erythema.  Minimal to no improvement overall in erythema with antibiotics.    Data Reviewed: Basic Metabolic Panel:  Recent Labs Lab 12/03/13 1407 12/05/13 0518 12/06/13 0503 12/08/13 0648  NA 143 147 144 144  K 4.4 4.0 4.0 4.2  CL 108 107 108 108  CO2 24 28 25 24   GLUCOSE 98 93 104* 93  BUN 20 16 16 15   CREATININE 0.51 0.57 0.53 0.50  CALCIUM 8.5 8.5 8.3* 8.5   Liver Function Tests: No results found for this basename: AST, ALT, ALKPHOS, BILITOT, PROT, ALBUMIN,  in the last 168 hours No results found for this basename: LIPASE, AMYLASE,  in the last 168 hours No results found for this basename: AMMONIA,  in the last 168 hours CBC:  Recent Labs Lab 12/03/13 1407 12/05/13 0518 12/06/13 0503  WBC 12.9* 11.6* 11.6*  NEUTROABS 11.0*  --   --   HGB 11.4* 11.0* 10.8*  HCT 35.8* 36.3 34.9*   MCV 90.9 93.6 91.8  PLT 416* 464* 408*   Cardiac Enzymes:  Recent Labs Lab 12/03/13 1407  TROPONINI <0.30   BNP (last 3 results) No results found for this basename: PROBNP,  in the last 8760 hours CBG: No results found for this basename: GLUCAP,  in the last 168 hours  Recent Results (from the past 240 hour(s))  CULTURE, BLOOD (ROUTINE X 2)     Status: None   Collection Time    12/03/13  2:06 PM      Result Value Ref Range Status   Specimen Description BLOOD RIGHT FOREARM   Final   Special Requests BOTTLES DRAWN AEROBIC AND ANAEROBIC 5ML   Final   Culture  Setup Time     Final   Value: 12/03/2013 17:24     Performed at [facility]   Culture     Final   Value: NO GROWTH 5 DAYS     Performed at [facility]   Report Status 12/09/2013  FINAL   Final  CULTURE, BLOOD (ROUTINE X 2)     Status: None   Collection Time    12/03/13  2:51 PM      Result Value Ref Range Status   Specimen Description BLOOD BLOOD LEFT FOREARM   Final   Special Requests BOTTLES DRAWN AEROBIC AND ANAEROBIC 3ML   Final   Culture  Setup Time     Final   Value: 12/03/2013 17:25     Performed at [facility]   Culture     Final   Value: NO GROWTH 5 DAYS     Performed at [facility]   Report Status 12/09/2013 FINAL   Final  URINE CULTURE     Status: None   Collection Time    12/04/13  1:40 PM      Result Value Ref Range Status   Specimen Description URINE, CATHETERIZED   Final   Special Requests NONE   Final   Culture  Setup Time     Final   Value: 12/04/2013 19:35     Performed at [facility]     Final   Value: NO GROWTH     Performed at [facility]   Culture     Final   Value: NO GROWTH     Performed at [facility]   Report Status 12/05/2013 FINAL   Final     Studies: No results found.  Scheduled Meds: . aspirin EC  325 mg Oral Daily  . docusate sodium  100 mg Oral Daily  . enoxaparin (LOVENOX) injection  30 mg  Subcutaneous Q24H  . feeding supplement (ENSURE COMPLETE)  237 mL Oral BID BM  . feeding supplement (PRO-STAT SUGAR FREE 64)  30 mL Oral BID  . levothyroxine  25 mcg Oral QAC breakfast  .  morphine injection  0.5 mg Intravenous TID  . pantoprazole  40 mg Oral Daily  . piperacillin-tazobactam (ZOSYN)  IV  3.375 g Intravenous Q8H  . saccharomyces boulardii  250 mg Oral BID  . vancomycin  1,000 mg Intravenous Q48H   Continuous Infusions:    Principal Problem:   Chronic heel ulcer with probable underlying osteomyelitis/chronic infection Active Problems:   PAD (peripheral artery disease)   Altered mental status   Protein-calorie malnutrition, severe   Adult failure to thrive   Recurrent infections    Time spent: 78 min   Vanshika Jastrzebski, MD, FACP, FHM. [facility] Pager [phone]  If 7PM-7AM, please contact night-coverage [email or web address] Password [email or web address] 12/09/2013, 3:41 PM    LOS: 6 days

## 2013-12-10 DIAGNOSIS — R509 Fever, unspecified: Secondary | ICD-10-CM

## 2013-12-10 MED ORDER — DSS 100 MG PO CAPS: 100.0000 mg | ORAL_CAPSULE | Freq: Every day | ORAL | Status: AC

## 2013-12-10 MED ORDER — MORPHINE SULFATE (CONCENTRATE) 10 MG /0.5 ML PO SOLN: 2.5000 mg | Freq: Two times a day (BID) | ORAL | Status: AC | PRN

## 2013-12-10 MED ORDER — MORPHINE SULFATE (CONCENTRATE) 10 MG /0.5 ML PO SOLN: 2.5000 mg | Freq: Three times a day (TID) | ORAL | Status: AC

## 2013-12-10 MED ORDER — PRO-STAT SUGAR FREE PO LIQD: 30.0000 mL | Freq: Two times a day (BID) | ORAL | Status: AC

## 2013-12-10 MED ORDER — ENSURE COMPLETE PO LIQD: 237.0000 mL | Freq: Two times a day (BID) | ORAL | Status: AC

## 2013-12-10 NOTE — Progress Notes (Signed)
Report called to [facility].  Daughter aware of impending patient transfer to facility.

## 2013-12-10 NOTE — Discharge Summary (Signed)
Physician Discharge Summary  Julie Frank ZSM:[id number] DOB: 09/28/1909 DOA: 12/03/2013  PCP: Hennie Duos, MD  Admit date: 12/03/2013 Discharge date: 12/10/2013  Time spent: Less than 30 minutes  Recommendations for Outpatient Follow-up:  1. Julie Frank being discharged to [facility] place for hospice care. 2. Oxygen via nasal cannula at 2 L per minute when necessary for comfort. 3. Continue Foley catheter at discharge for comfort.  Discharge Diagnoses:  Principal Problem:   Chronic heel ulcer with probable underlying osteomyelitis/chronic infection Active Problems:   PAD (peripheral artery disease)   Altered mental status   Protein-calorie malnutrition, severe   Adult failure to thrive   Recurrent infections   Discharge Condition: Improved & Stable  Diet recommendation: Dysphagia 1 diet and nectar thickened liquids.  Filed Weights   12/03/13 1500  Weight: 52.6 kg (115 lb 15.4 oz)    History of present illness:  Julie Frank is an 80 y.o. female with multiple medical problems hospitalized 11/23/13-11/30/13 with sepsis from a urinary source (urine culture from 11/23/13 grew Proteus mirabilis) as well as chronic osteomyelitis secondary to a chronic heel wound in the setting of PVD, and was evaluated by Dr. Donnetta Hutching who offered AKA, which the Julie Frank/family declined. She was seen by the palliative care team, with DNR status confirmed, but with wish for ongoing treatment for infection/illness. She presented with sepsis secondary to gangrene and osteo of the left foot.   Hospital Course:   Sepsis secondary to left heel ulcer and gangrene with osteomyelitis. Have recommended that Julie Frank either have ongoing abx to treat her infection with risk of infectious diarrhea, kidney damage, or other toxicities or become full comfort measures and stop antibiotics. It is felt that if antibiotics were discontinued, that she would quickly decline with sepsis and gangrene and would repeat the  previous cycle that led to this admission. If antibiotics were discontinued, it has been recommended entering hospice care with comfort measures only. It has been recommended against amputation at this time because of her mother's frailty and feel that she would either die in surgery or have a complicated and painful recovery, drastically decreasing her quality of life. Julie Frank asked pointed questions to Dr. Sheran Fava about whether her mother's PVD should have been caught earlier and whether malnutrition and dehydration caused her condition. Dr. Oneida Alar, vascular surgeon met with Julie Frank and family on 9/15 and family agreed on pursuing comfort care only and hospice. Blood cultures x2 negative. She was treated empirically with IV vancomycin and Zosyn since admission but will be discharged without antibiotics as has been discussed with daughter. Continue dry dressing change daily and as needed. Palliative care consult and then met with daughter a couple of times and she finally decided on hospice care and transition to [facility] place for further management. Julie Frank is on low dose scheduled morphine and has not required much when necessary doses. We'll discharge low dose liquid morphine which can be titrated as needed.  PAD (peripheral artery disease) with gangrene  - Seen by Dr. Christean Grief, see above   Altered mental status in the setting of vascular dementia, improved on broad spectrum antibiotics, markedly improved. Near baseline mentation  - Likely secondary to smoldering infection and inflammation   Protein-calorie malnutrition, severe, eating better recently  - Appreciate nutrition recommendations  - Continue dysphagia 1 with nectar thick liquids  - Appreciate Speech therapy assistance   Acute respiratory failure with pulmonary edema due to acute on chronic diastolic heart failure vs. HCAP vs. Aspiration pneumonia. -  abx as above  - Wean O2 as tolerated. Hypoxia seems to have resolved. Use oxygen for  comfort as needed.   Leukocytosis due to osteo and gangrene, trended down slightly   Normocytic anemia likely due to chronic disease, hgb approximately stable   Thrombocytosis, acute phase reactant, plt decreasing  Hypertension: Controlled off medications.   Consultations:  Palliative care team  Vascular surgery  Procedures:  None    Discharge Exam:  Complaints:  Julie Frank pleasantly confused. Hard of hearing. Denies complaints. Denies pain. As per daughter, Julie Frank looking much better today. Has more color to her.  Filed Vitals:   12/09/13 1100 12/09/13 1300 12/09/13 2247 12/10/13 0630  BP: 127/58 125/73 131/76 149/71  Pulse: 73 66 70 79  Temp: 97.2 F (36.2 C) 97.4 F (36.3 C) 97.7 F (36.5 C) 97.4 F (36.3 C)  TempSrc: Oral Oral Oral Oral  Resp: 18 28 16 18   Height:      Weight:      SpO2: 97% 95% 95% 98%   General: Pleasant elderly frail female, chronically ill-looking cachectic, lying comfortably in bed. Alert and looking around. Cardiovascular: RRR, nl S1, S2, 3/6 systolic murmur at apex/LSB, 2+ pulses, warm extremities  Respiratory: Occasional basal crackles but otherwise clear to auscultation. No increased work of breathing.  Abdomen: NABS, soft, NT/ND  CNS: Alert, oriented to self. Mouths "no"when asked about pain. No focal deficits. MSK: Increased tone, decrease muscle mass, left heel necrotic with surrounding erythema, purulence and foul odor. Tip of left big toe with possibly some dry gangrene. Several erythematous spots that may be early gangrene on lateral aspect of foot in addition to the 2cm necrotic area on the left lateral aspect of her foot which still has surrounding erythema. Minimal to no improvement overall in erythema with antibiotics.    Discharge Instructions      Discharge Instructions   Call MD for:  severe uncontrolled pain    Complete by:  As directed      Discharge instructions    Complete by:  As directed   1) DIET: Dysphagia 1  diet and nectar thickened liquids. 2) Oxygen via nasal cannula 2L/min prn for comfort. 3) Discharged with Foley catheter for comfort.     Increase activity slowly    Complete by:  As directed             Medication List    STOP taking these medications       amLODipine 5 MG tablet  Commonly known as:  NORVASC     CALCIUM 600 + D 600-200 MG-UNIT Tabs  Generic drug:  Calcium Carb-Cholecalciferol     multivitamin with minerals Tabs tablet     PRESCRIPTION MEDICATION     traMADol 50 MG tablet  Commonly known as:  ULTRAM      TAKE these medications       acetaminophen 325 MG tablet  Commonly known as:  TYLENOL  Take 650 mg by mouth every 12 (twelve) hours.     aspirin EC 325 MG tablet  Take 325 mg by mouth every morning.     DSS 100 MG Caps  Take 100 mg by mouth daily.     feeding supplement (ENSURE COMPLETE) Liqd  Take 237 mLs by mouth 2 (two) times daily between meals.     feeding supplement (PRO-STAT SUGAR FREE 64) Liqd  Take 30 mLs by mouth 2 (two) times daily.     levothyroxine 25 MCG tablet  Commonly known as:  SYNTHROID, LEVOTHROID  Take 25 mcg by mouth daily before breakfast.     morphine CONCENTRATE 10 mg / 0.5 ml concentrated solution  Take 0.13 mLs (2.6 mg total) by mouth 3 (three) times daily.     morphine CONCENTRATE 10 mg / 0.5 ml concentrated solution  Take 0.13 mLs (2.6 mg total) by mouth every 12 (twelve) hours as needed for severe pain.     omeprazole 20 MG capsule  Commonly known as:  PRILOSEC  Take 20 mg by mouth every morning.          The results of significant diagnostics from this hospitalization (including imaging, microbiology, ancillary and laboratory) are listed below for reference.    Significant Diagnostic Studies: Dg Chest Port 1 View  12/03/2013   CLINICAL DATA:  Tachypnea. History of hypertension, CHF, altered level of consciousness.  EXAM: PORTABLE CHEST - 1 VIEW  COMPARISON:  11/30/2013  FINDINGS: Heart is enlarged.  There are bilateral pleural effusions. Bibasilar opacities obscure the hemidiaphragms. There is increased perihilar infiltrate.  IMPRESSION: 1. Cardiomegaly and increasing airspace filling, suggestive of increased pulmonary edema and or infectious process. 2. Persistent bilateral pleural effusions.   Electronically Signed   By: Shon Hale M.D.   On: 12/03/2013 14:30   Dg Chest Port 1 View  11/30/2013   CLINICAL DATA:  Pulmonary edema, followup, history hypertension, CHF  EXAM: PORTABLE CHEST - 1 VIEW  COMPARISON:  Portable exam 1036 hr compared to 11/26/2013  FINDINGS: Rotated to the RIGHT.  Enlargement of cardiac silhouette.  Mediastinal contour stable with calcified tortuous thoracic aorta noted.  Bibasilar effusions and atelectasis.  Slightly improved perihilar infiltrates question edema.  No pneumothorax.  Bones demineralized.  IMPRESSION: Enlargement of cardiac silhouette with bibasilar effusions and atelectasis.  Slightly improved perihilar infiltrates.   Electronically Signed   By: Lavonia Dana M.D.   On: 11/30/2013 12:02   Dg Chest Port 1 View  11/26/2013   CLINICAL DATA:  One 89 and 29-year-old female with increased shortness of Breath. Initial encounter.  EXAM: PORTABLE CHEST - 1 VIEW  COMPARISON:  11/23/2013 and earlier.  FINDINGS: Portable AP semi upright view at 1339 hrs. The Julie Frank is less rotated. New small to moderate bilateral veiling pulmonary opacities. No pneumothorax. Pulmonary vascularity appears mildly increased, but without overt pulmonary edema. Stable cardiomegaly and mediastinal contours.  IMPRESSION: New small to moderate bilateral pleural effusions.   Electronically Signed   By: Lars Pinks M.D.   On: 11/26/2013 13:49   Dg Chest Port 1 View  11/23/2013   CLINICAL DATA:  Weakness, lethargic.  EXAM: PORTABLE CHEST - 1 VIEW  COMPARISON:  12/04/2012  FINDINGS: Aortic atherosclerosis and prominence. Cardiomegaly. Small effusions and bibasilar opacities, similar to prior. Diffuse  osteopenia. Multilevel degenerative changes and osteopenia  IMPRESSION: Small effusions and bibasilar opacities ; atelectasis versus infiltrate.  Prominent cardiomediastinal contours, similar to prior.   Electronically Signed   By: Carlos Levering M.D.   On: 11/23/2013 22:53   Dg Foot Complete Left  11/23/2013   CLINICAL DATA:  Left great toe wound.  EXAM: LEFT FOOT - COMPLETE 3+ VIEW  COMPARISON:  None.  FINDINGS: Diffuse osteopenia. No displaced acute fracture or dislocation. No aggressive osseous lesion. Atherosclerotic vascular calcifications. Soft tissue irregularity overlies the calcaneus.  IMPRESSION: Diffuse osteopenia.  No acute osseous finding.  Soft tissue wound or ulceration overlies the calcaneus.  Consider MRI if concern for acute osteomyelitis persists.   Electronically Signed   By: Carlos Levering  M.D.   On: 11/23/2013 22:56    Microbiology: Recent Results (from the past 240 hour(s))  CULTURE, BLOOD (ROUTINE X 2)     Status: None   Collection Time    12/03/13  2:06 PM      Result Value Ref Range Status   Specimen Description BLOOD RIGHT FOREARM   Final   Special Requests BOTTLES DRAWN AEROBIC AND ANAEROBIC 5ML   Final   Culture  Setup Time     Final   Value: 12/03/2013 17:24     Performed at [facility]   Culture     Final   Value: NO GROWTH 5 DAYS     Performed at [facility]   Report Status 12/09/2013 FINAL   Final  CULTURE, BLOOD (ROUTINE X 2)     Status: None   Collection Time    12/03/13  2:51 PM      Result Value Ref Range Status   Specimen Description BLOOD BLOOD LEFT FOREARM   Final   Special Requests BOTTLES DRAWN AEROBIC AND ANAEROBIC 3ML   Final   Culture  Setup Time     Final   Value: 12/03/2013 17:25     Performed at [facility]   Culture     Final   Value: NO GROWTH 5 DAYS     Performed at [facility]   Report Status 12/09/2013 FINAL   Final  URINE CULTURE     Status: None   Collection Time    12/04/13  1:40 PM       Result Value Ref Range Status   Specimen Description URINE, CATHETERIZED   Final   Special Requests NONE   Final   Culture  Setup Time     Final   Value: 12/04/2013 19:35     Performed at [facility]     Final   Value: NO GROWTH     Performed at [facility]   Culture     Final   Value: NO GROWTH     Performed at [facility]   Report Status 12/05/2013 FINAL   Final     Labs: Basic Metabolic Panel:  Recent Labs Lab 12/03/13 1407 12/05/13 0518 12/06/13 0503 12/08/13 0648  NA 143 147 144 144  K 4.4 4.0 4.0 4.2  CL 108 107 108 108  CO2 24 28 25 24   GLUCOSE 98 93 104* 93  BUN 20 16 16 15   CREATININE 0.51 0.57 0.53 0.50  CALCIUM 8.5 8.5 8.3* 8.5   Liver Function Tests: No results found for this basename: AST, ALT, ALKPHOS, BILITOT, PROT, ALBUMIN,  in the last 168 hours No results found for this basename: LIPASE, AMYLASE,  in the last 168 hours No results found for this basename: AMMONIA,  in the last 168 hours CBC:  Recent Labs Lab 12/03/13 1407 12/05/13 0518 12/06/13 0503  WBC 12.9* 11.6* 11.6*  NEUTROABS 11.0*  --   --   HGB 11.4* 11.0* 10.8*  HCT 35.8* 36.3 34.9*  MCV 90.9 93.6 91.8  PLT 416* 464* 408*   Cardiac Enzymes:  Recent Labs Lab 12/03/13 1407  TROPONINI <0.30   BNP: BNP (last 3 results) No results found for this basename: PROBNP,  in the last 8760 hours CBG: No results found for this basename: GLUCAP,  in the last 168 hours   Signed:  Vernell Leep, MD, FACP, FHM. [facility] Pager [phone]  If 7PM-7AM, please contact  night-coverage [email or web address] Password [facility] 12/10/2013, 10:19 AM

## 2013-12-10 NOTE — Progress Notes (Signed)
Clinical Social Work  Patient accepted to [facility] and dtr has completed paperwork. CSW prepared DC packet with PTAR forms, DC summary and DNR form. RN to call report to [phone]. CSW coordinated transportation via [place]. Request #: [id number].  CSW met with patient's dtr to finalize DC plans. Dtr spoke about family dynamics and feeling overwhelmed that brother is not more involved. Dtr reports that she is happy with the decision she has made and feels that hospice is most appropriate for patient at this time. CSW provided information about funeral costs and assistance.  CSW is signing off but available if needed.  [place], [place] [phone]

## 2013-12-13 ENCOUNTER — Encounter: Payer: Medicare Other | Admitting: Surgery

## 2013-12-17 ENCOUNTER — Telehealth: Payer: Self-pay

## 2013-12-17 NOTE — Telephone Encounter (Signed)
Spoke to daughter [name] she stated mother was transferred to [facility].Stated she had a good week.Stated they only keep pts for 2 weeks.Stated she may be transferred to a skilled nursing facility.Stated she will keep [place] informed.

## 2013-12-31 ENCOUNTER — Ambulatory Visit: Payer: Medicare Other | Admitting: Cardiology

## 2014-01-03 ENCOUNTER — Telehealth: Payer: Self-pay | Admitting: Cardiology

## 2014-01-03 NOTE — Telephone Encounter (Signed)
New message     Talk to [name] to tell her Ms Rivard died last 07-17-2022 and to thank you for all of your help

## 2014-01-03 NOTE — Telephone Encounter (Signed)
I spoke with the pt's daughter [name] and she wanted to make Dr Fletcher Anon aware that the pt passed away on 2014-01-03. [name] wanted to thank Dr Fletcher Anon for the care that he provided to her mother.

## 2014-01-04 ENCOUNTER — Telehealth: Payer: Self-pay | Admitting: Cardiology

## 2014-01-04 NOTE — Telephone Encounter (Signed)
Received call from patient's daughter [name] stating her mother had passed away wanting to let Dr.Jordan know.Sympathy extended to daughter.Will let Dr.Jordan know.

## 2014-01-05 ENCOUNTER — Ambulatory Visit: Payer: Medicare Other | Admitting: Cardiology

## 2014-01-13 ENCOUNTER — Ambulatory Visit: Payer: Medicare Other | Admitting: Cardiology

## 2014-01-20 ENCOUNTER — Ambulatory Visit: Payer: Medicare Other | Admitting: Cardiology

## 2014-01-23 DEATH — deceased

## 2014-03-03 ENCOUNTER — Encounter (HOSPITAL_COMMUNITY): Payer: Self-pay | Admitting: Cardiovascular Disease

## 2015-08-29 IMAGING — CT CT HEAD W/O CM
1 of 3 series · 15 of 30 positions shown, 19 images · non-contrast
Comparison: 03/10/2012

CLINICAL DATA: Stroke-like symptoms

EXAM:
CT HEAD WITHOUT CONTRAST
TECHNIQUE: Contiguous axial images were obtained from the base of the skull
through the vertex without intravenous contrast.

[Series 3: head 2.0 h70h · axial · 0.46mm/px · z∈[+665,+821]mm · 15 of 88 slices shown, 19 images]
[im 5/88  brain]
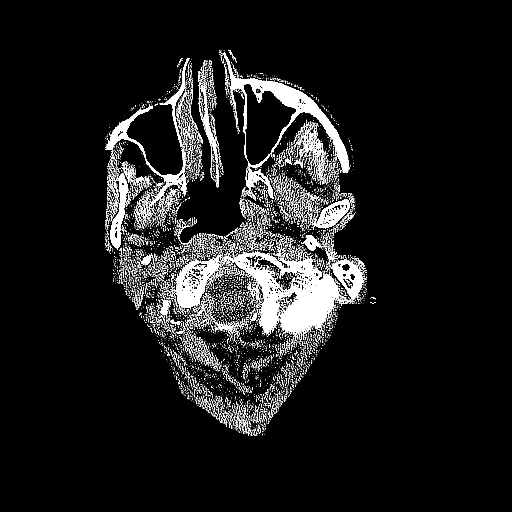
[im 5/88  bone]
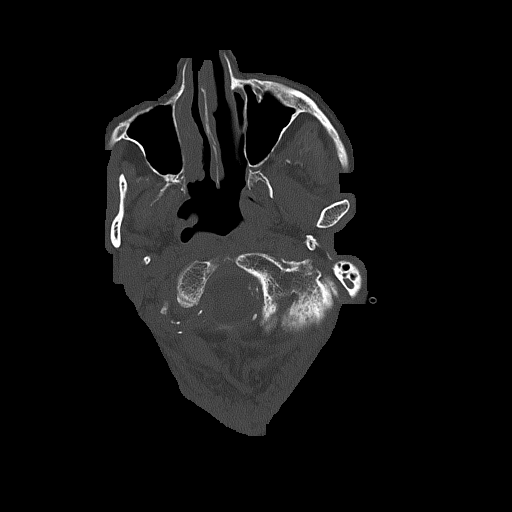
[im 10/88  brain]
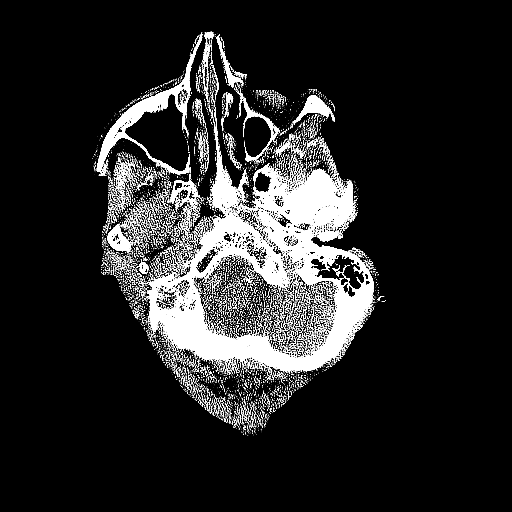
[im 15/88  brain]
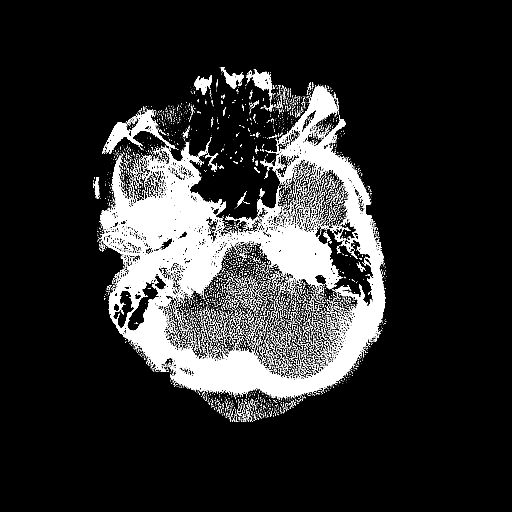
[im 20/88  brain]
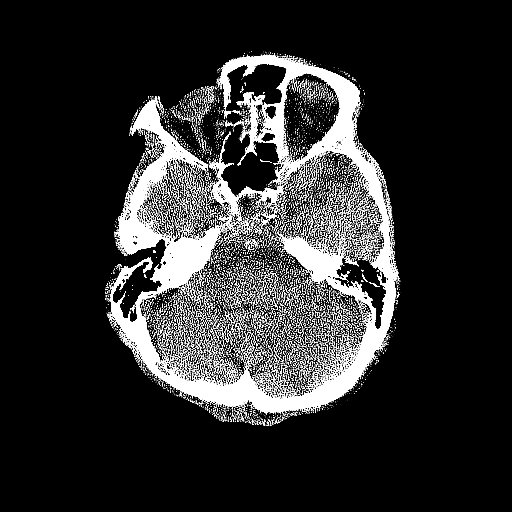
[im 30/88  brain]
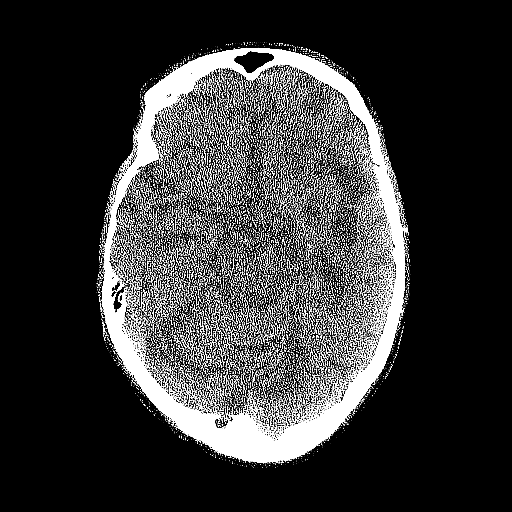
[im 30/88  bone]
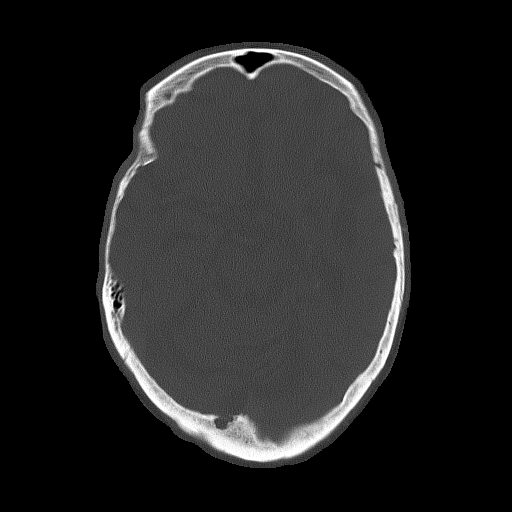
[im 34/88  brain]
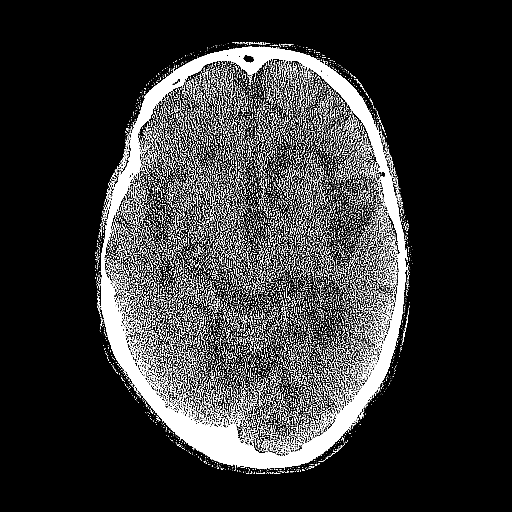
[im 39/88  brain]
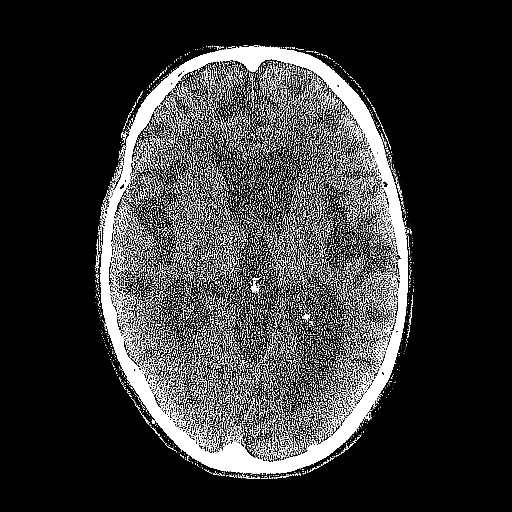
[im 44/88  brain]
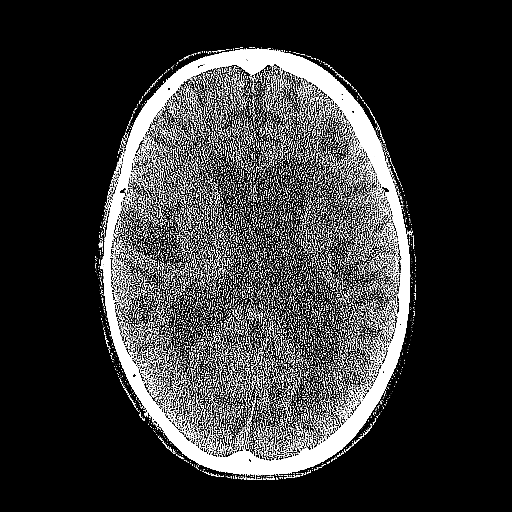
[im 49/88  brain]
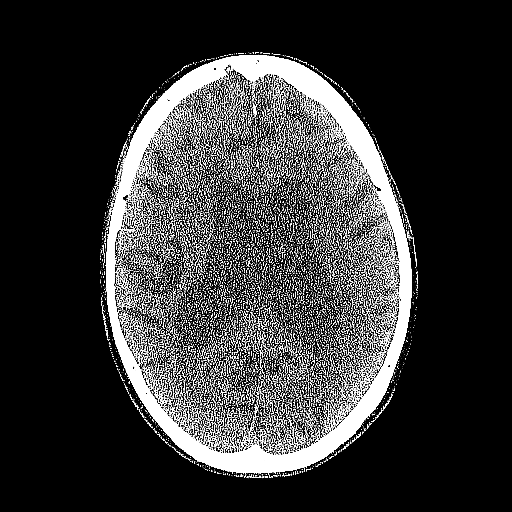
[im 49/88  bone]
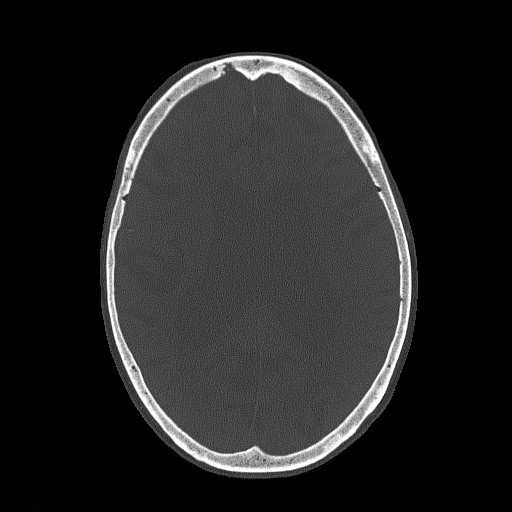
[im 54/88  brain]
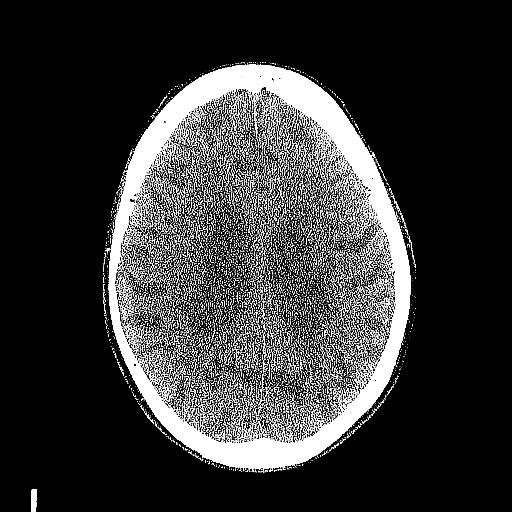
[im 59/88  brain]
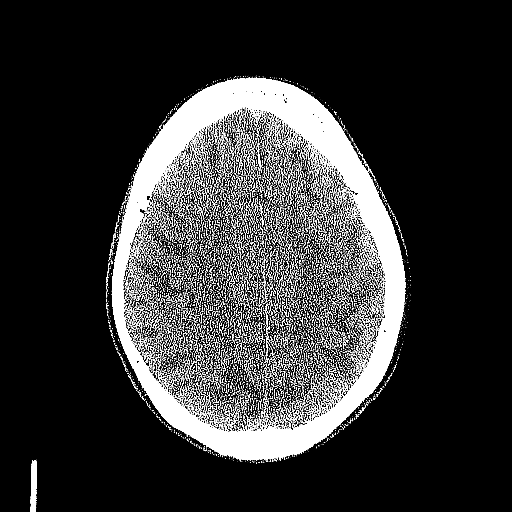
[im 68/88  brain]
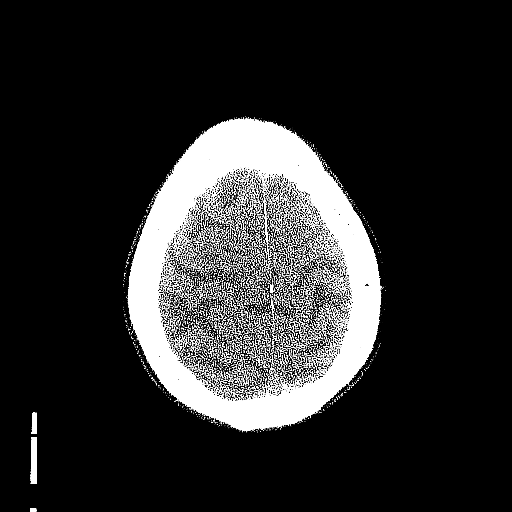
[im 73/88  brain]
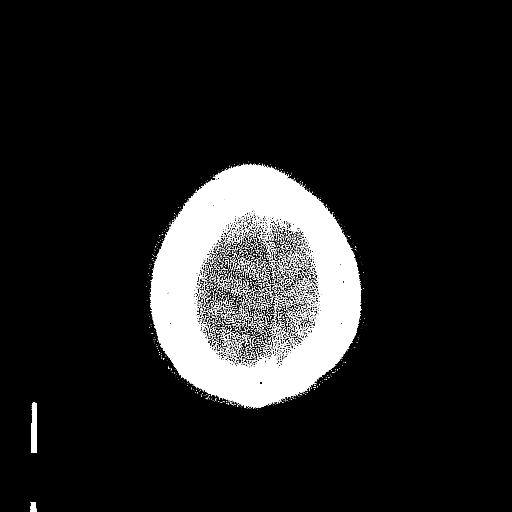
[im 73/88  bone]
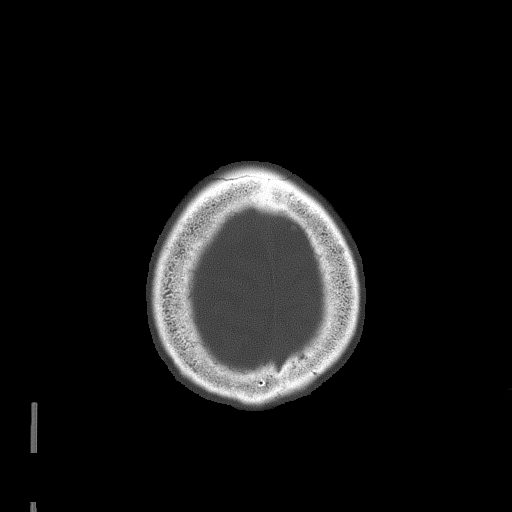
[im 78/88  brain]
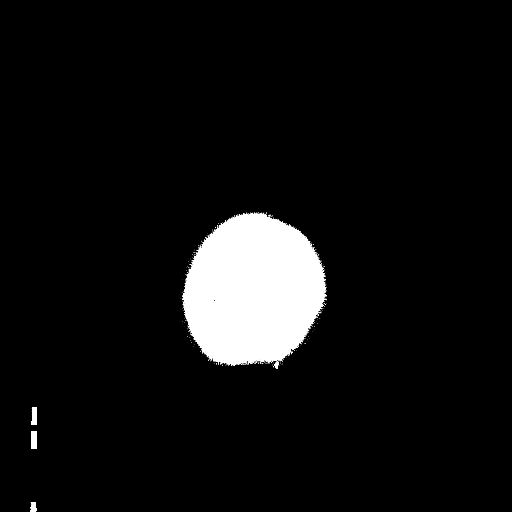
[im 83/88  brain]
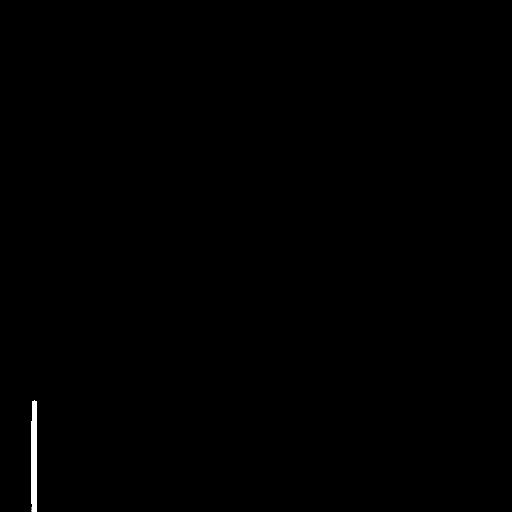

[15 of 30 positions shown; findings below may reference images not displayed]

FINDINGS: No intracranial hemorrhage, mass effect or midline shift. No skull
fracture is noted. Paranasal sinuses and mastoid air cells are
unremarkable.

Again noted stable atrophy. Extensive periventricular and patchy
subcortical white matter decreased attenuation consistent with
chronic small vessel ischemic changes again noted. No definite acute
cortical infarction. No mass lesion is noted on this unenhanced
scan.
IMPRESSION: No acute intracranial abnormality. Stable cerebral atrophy. No
definite acute cortical infarction. Extensive periventricular and
patchy subcortical white matter decreased attenuation probable due
to chronic small vessel ischemic changes. If there is high clinical
suspicion for acute infarction follow-up MRI with diffusion imaging
is recommended.

## 2015-08-29 IMAGING — CR DG CHEST 1V PORT
1 series · 1 of 1 positions shown · non-contrast
Comparison: 03/10/2012

CLINICAL DATA: Weakness, congestive heart failure

EXAM:
PORTABLE CHEST - 1 VIEW

[AP]
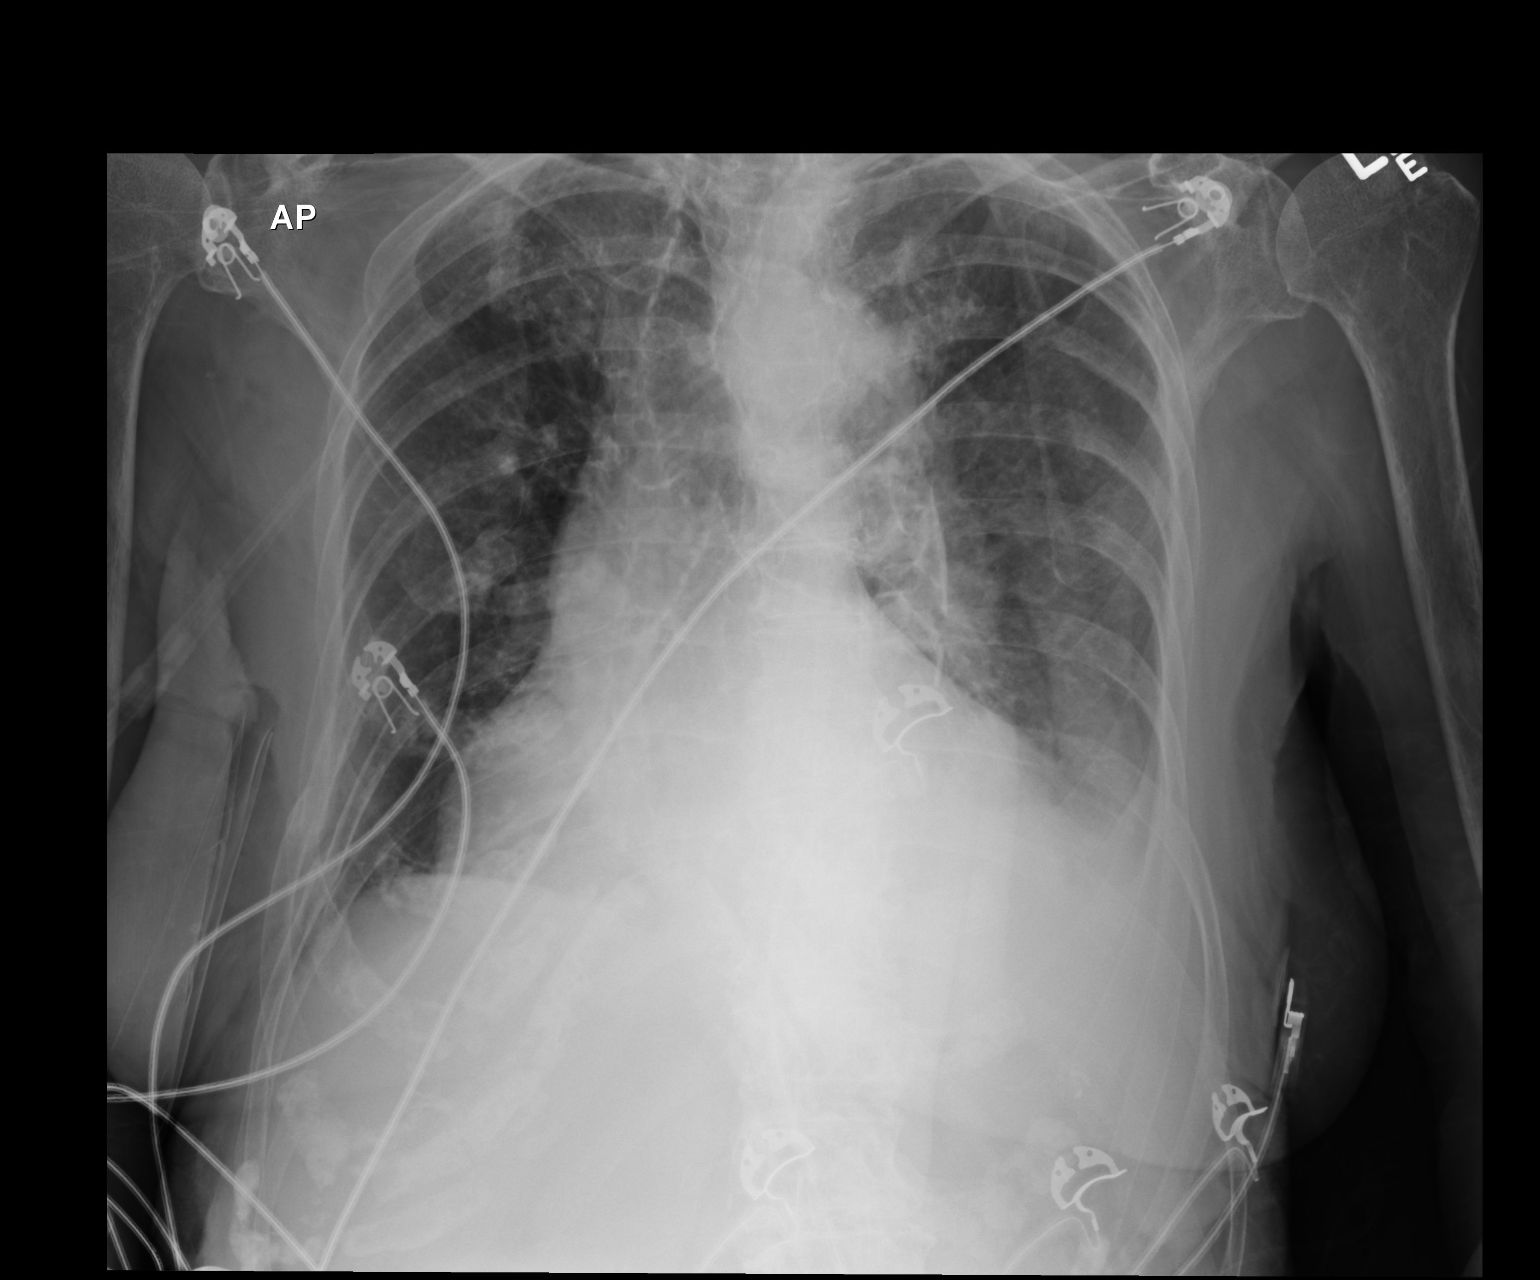

[1 of 1 positions shown; findings below may reference images not displayed]

FINDINGS: Cardiomegaly again noted. Small left pleural effusion with left
basilar atelectasis or infiltrate. No convincing pulmonary edema.
The patient is rotated to the right. Diffuse osteopenia is noted.
IMPRESSION: Cardiomegaly. No pulmonary edema. Small left pleural effusion with
left basilar atelectasis or infiltrate.
